# Patient Record
Sex: Female | Born: 1935
Health system: Southern US, Community
[De-identification: ages and names within clinical notes are randomized; demographics above are authoritative.]

## PROBLEM LIST (undated history)

## (undated) DIAGNOSIS — I10 Essential (primary) hypertension: Secondary | ICD-10-CM

## (undated) DIAGNOSIS — C801 Malignant (primary) neoplasm, unspecified: Secondary | ICD-10-CM

## (undated) DIAGNOSIS — E119 Type 2 diabetes mellitus without complications: Secondary | ICD-10-CM

## (undated) DIAGNOSIS — K5790 Diverticulosis of intestine, part unspecified, without perforation or abscess without bleeding: Secondary | ICD-10-CM

## (undated) DIAGNOSIS — E039 Hypothyroidism, unspecified: Secondary | ICD-10-CM

## (undated) DIAGNOSIS — M1990 Unspecified osteoarthritis, unspecified site: Secondary | ICD-10-CM

## (undated) HISTORY — PX: EYE SURGERY: SHX253

## (undated) HISTORY — PX: JOINT REPLACEMENT: SHX530

## (undated) HISTORY — PX: ANKLE SURGERY: SHX546

## (undated) HISTORY — PX: SHOULDER OPEN ROTATOR CUFF REPAIR: SHX2407

## (undated) HISTORY — PX: APPENDECTOMY: SHX54

## (undated) HISTORY — PX: CATARACT EXTRACTION: SUR2

## (undated) HISTORY — PX: SHOULDER ARTHROSCOPY W/ ROTATOR CUFF REPAIR: SHX2400

## (undated) HISTORY — PX: TUBAL LIGATION: SHX77

---

## 2000-06-11 ENCOUNTER — Encounter (INDEPENDENT_AMBULATORY_CARE_PROVIDER_SITE_OTHER): Payer: Self-pay | Admitting: *Deleted

## 2000-06-11 ENCOUNTER — Ambulatory Visit (HOSPITAL_BASED_OUTPATIENT_CLINIC_OR_DEPARTMENT_OTHER): Admission: RE | Admit: 2000-06-11 | Discharge: 2000-06-11 | Payer: Self-pay | Admitting: Plastic Surgery

## 2000-06-18 ENCOUNTER — Encounter (INDEPENDENT_AMBULATORY_CARE_PROVIDER_SITE_OTHER): Payer: Self-pay | Admitting: Specialist

## 2000-06-18 ENCOUNTER — Ambulatory Visit (HOSPITAL_COMMUNITY): Admission: RE | Admit: 2000-06-18 | Discharge: 2000-06-18 | Payer: Self-pay

## 2000-07-09 ENCOUNTER — Ambulatory Visit (HOSPITAL_BASED_OUTPATIENT_CLINIC_OR_DEPARTMENT_OTHER): Admission: RE | Admit: 2000-07-09 | Discharge: 2000-07-09 | Payer: Self-pay | Admitting: Plastic Surgery

## 2001-05-13 ENCOUNTER — Other Ambulatory Visit: Admission: RE | Admit: 2001-05-13 | Discharge: 2001-05-13 | Payer: Self-pay | Admitting: Family Medicine

## 2002-05-16 ENCOUNTER — Other Ambulatory Visit: Admission: RE | Admit: 2002-05-16 | Discharge: 2002-05-16 | Payer: Self-pay | Admitting: Family Medicine

## 2003-11-13 ENCOUNTER — Ambulatory Visit (HOSPITAL_COMMUNITY): Admission: RE | Admit: 2003-11-13 | Discharge: 2003-11-13 | Payer: Self-pay | Admitting: Plastic Surgery

## 2003-11-13 ENCOUNTER — Ambulatory Visit (HOSPITAL_BASED_OUTPATIENT_CLINIC_OR_DEPARTMENT_OTHER): Admission: RE | Admit: 2003-11-13 | Discharge: 2003-11-13 | Payer: Self-pay | Admitting: Plastic Surgery

## 2004-03-29 ENCOUNTER — Encounter: Admission: RE | Admit: 2004-03-29 | Discharge: 2004-03-29 | Payer: Self-pay | Admitting: Specialist

## 2004-05-02 ENCOUNTER — Ambulatory Visit (HOSPITAL_COMMUNITY): Admission: RE | Admit: 2004-05-02 | Discharge: 2004-05-02 | Payer: Self-pay | Admitting: *Deleted

## 2004-06-07 ENCOUNTER — Inpatient Hospital Stay (HOSPITAL_COMMUNITY): Admission: RE | Admit: 2004-06-07 | Discharge: 2004-06-08 | Payer: Self-pay | Admitting: Specialist

## 2004-12-27 ENCOUNTER — Encounter: Admission: RE | Admit: 2004-12-27 | Discharge: 2005-01-27 | Payer: Self-pay | Admitting: Family Medicine

## 2005-08-26 ENCOUNTER — Ambulatory Visit (HOSPITAL_COMMUNITY): Admission: RE | Admit: 2005-08-26 | Discharge: 2005-08-27 | Payer: Self-pay | Admitting: Ophthalmology

## 2007-04-14 ENCOUNTER — Inpatient Hospital Stay (HOSPITAL_COMMUNITY): Admission: RE | Admit: 2007-04-14 | Discharge: 2007-04-18 | Payer: Self-pay | Admitting: Orthopedic Surgery

## 2007-05-11 ENCOUNTER — Encounter: Admission: RE | Admit: 2007-05-11 | Discharge: 2007-06-10 | Payer: Self-pay | Admitting: Orthopedic Surgery

## 2010-05-26 ENCOUNTER — Encounter: Payer: Self-pay | Admitting: Family Medicine

## 2010-09-17 NOTE — H&P (Signed)
Nicole Sherman, Nicole Sherman               ACCOUNT NO.:  [id number]   MEDICAL RECORD NO.:  [id number]          PATIENT TYPE:  INP   LOCATION:  0005                         FACILITY:  [facility]   PHYSICIAN:  Ollen Gross, M.D.    DATE OF BIRTH:  [birth year]   DATE OF ADMISSION:  04/14/2007  DATE OF DISCHARGE:                              HISTORY & PHYSICAL   CHIEF COMPLAINT:  Right knee pain.   HISTORY OF PRESENT ILLNESS:  The patient is a 77 year old female who is  seen by Dr. Lequita Halt for ongoing knee problems that have progressing  gotten worse, especially over the past year. She has known end-stage  arthritis of the right knee with bone-on-bone. She has been refractory  to conservative management and now presents for total knee arthroplasty.  She has been seen preoperatively by Dr. Clarene Duke and felt to be medically  okay for upcoming surgery.  She has had a recent Cardiolite study read  by Dr. Catalina Gravel and felt to be low-risk nuclear study. No further  cardiac testing necessary prior to her surgery   ALLERGIES:  No known drug allergies.  Intolerances:  While taking  ASPIRIN and ALEVE together, did have bleeding problems.   CURRENT MEDICATIONS:  Levothyroxine, lisinopril, hydrochlorothiazide,  and Tylenol Arthritis.   PAST MEDICAL HISTORY:  1. Hypertension.  2. History of urinary tract infections.  3. History of rectal bleeding secondary to aspirin and Aleve therapy.  4. Non-insulin-dependent diabetes mellitus.  5. Hypothyroidism.   PAST SURGICAL HISTORY:  1. Appendectomy.  2. Ankle surgery.  3. Rotator cuff surgery.   SOCIAL HISTORY:  Married, retired, nonsmoker, no alcohol.  Two children.  Husband will be assisting with care after surgery.   FAMILY HISTORY:  Significant heart disease and diabetes with her sister.   REVIEW OF SYSTEMS:  GENERAL:  No fevers, chills, night sweats.  NEUROLOGIC:  No seizures, syncope, or paralysis.  RESPIRATORY:  No  shortness of breath, productive  cough or hemoptysis.  CARDIOVASCULAR:  No chest pain, angina, or orthopnea.  GI:  No nausea, vomiting, diarrhea  or constipation.  GU:  No dysuria, hematuria or discharge.  MUSCULOSKELETAL:  Right knee.   PHYSICAL EXAMINATION:  VITAL SIGNS:  Pulse 76, respirations 12.  Blood  pressure 126/72.  GENERAL:  A 77 year old white female, well nourished, well developed,  slightly overweight.  No acute distress.  She is alert and cooperative.  HEENT: Normocephalic, atraumatic.  Pupils are reactive.  Oropharynx  clear.  EOMs intact.  Full upper and lower denture plates.  NECK:  Supple.  CHEST:  Clear anterior posterior chest walls.  No rhonchi, rales,  wheezing.  HEART:  Early faint systolic ejection murmur best heard over aortic  point. S1-S2 noted  ABDOMEN:  Soft, nontender.  Bowel sounds present.  RECTAL, BREASTS, GENITALIA:  Not done, not pertinent to present illness.  EXTREMITIES:  Right knee significant crepitus noted.  No effusion.  Range of motion 10-115.  No instability.   IMPRESSION:  1. Osteoarthritis, right knee.  2. Hypertension.  3. Past history of urinary tract infections.  4. Past history of  rectal bleed secondary to ASPIRIN and ALEVE.  5. Non-insulin-dependent diabetes mellitus.  6. Hypothyroidism.   PLAN:  The patient will be admitted to [facility] to undergo a  right total knee arthroplasty.  Surgery will be performed by Dr. Ollen Gross.      Alexzandrew L. Perkins, P.A.C.      Ollen Gross, M.D.  Electronically Signed    ALP/MEDQ  D:  04/13/2007  T:  04/14/2007  Job:  [id number]   cc:   Caryn Bee L. Little, M.D.  Fax: [phone]

## 2010-09-17 NOTE — Op Note (Signed)
Nicole Sherman, Nicole Sherman               ACCOUNT NO.:  [id number]   MEDICAL RECORD NO.:  [id number]          PATIENT TYPE:  INP   LOCATION:  0005                         FACILITY:  [facility]   PHYSICIAN:  Ollen Gross, M.D.    DATE OF BIRTH:  [birth year]   DATE OF PROCEDURE:  04/14/2007  DATE OF DISCHARGE:                               OPERATIVE REPORT   PREOPERATIVE DIAGNOSIS:  Osteoarthritis, right knee.   POSTOPERATIVE DIAGNOSIS:  Osteoarthritis, right knee.   PROCEDURE:  Right total knee arthroplasty.   SURGEON:  Ollen Gross, M.D.   ASSISTANT:  Alexzandrew L. Perkins, P.A.C.   ANESTHESIA:  Spinal.   ESTIMATED BLOOD LOSS:  Minimal.   DRAINS:  None.   TOURNIQUET TIME:  31 minutes at 300 mmHg.   COMPLICATIONS:  None.   CONDITION:  Stable to the recovery room.   CLINICAL NOTE:  Nicole Sherman is a 77 year old female with end stage  arthritis of the right knee with progressively worsening pain and  dysfunction.  She has failed nonoperative management, has bone-on-bone  arthritis, and presents now for total knee arthroplasty.   PROCEDURE IN DETAIL:  After the successful administration of spinal  anesthetic, a tourniquet was placed high on the right thigh, and the  right lower extremity prepped and draped in the usual sterile fashion.  The extremity was wrapped in Esmarch, knee flexed, and tourniquet  inflated to 300 mmHg.  A midline incision was made with a 10 blade  through subcutaneous tissue to the level of the extensor mechanism.  A  fresh blade was used to make a medial parapatellar arthrotomy.  Soft  tissue on the proximal medial tibia is subperiosteally elevated to the  joint line with the knife into the semimembranosus bursa with a Cobb  elevator.  Soft tissue laterally is elevated with attention being paid  to avoid the patellar tendon on the tibial tubercle.  The patella  subluxed laterally, knee flexed 90 degrees, ACL and PCL were removed.  The drill was used to  create a starting hole in the distal femur and the  canal was thoroughly irrigated.  The 5 degrees right valgus alignment  guide is placed and referencing off the posterior condyles, rotation is  marked and the block pinned to remove 10 mm off the distal femur.  Distal femoral resection is made with an oscillating saw.  Sizing block  was placed, size 4 is the most appropriate.  Rotation is marked off the  epicondylar axis.  A size 4 cutting block was placed and the anterior,  posterior, and chamfer cuts were made.   The tibia subluxed forward and the menisci were removed.  The  extramedullary tibial alignment guide is placed referencing proximally  at the medial aspect of the tibial tubercle and distally along the  second metatarsal axis of the tibial crest.  The block was pinned to  remove about 4 mm off the more deficient lateral side.  Tibial resection  is made with an oscillating saw.  Size 2.5 is the most appropriate  tibial component and the proximal tibia is prepared with the modular  drill and keel punch for size 2.5.  Femoral preparation is completed  with the intercondylar cut.   A size 2.5 mobile bearing tibial trial, 2.5 posterior stabilized femoral  trial, and a 10 mm posterior stabilized rotating platform insert trial  are placed.  With the 10, she hyperextended a degree or two and we went  to 12.5 which allowed for full extension with excellent varus, valgus,  anterior, posterior balance throughout full range of motion.  The  patella was then everted, thickness measured to be 24 mm.  Free hand  resection was taken to 14 mm, 38 template is placed, lug holes were  drilled, trial patella was placed and it tracks normally.  Osteophytes  were removed off the posterior femur with the trial in place.  All  trials were removed and the cut bone surfaces are prepared with  pulsatile lavage.  The cement was mixed and once ready for implantation,  size 2.5 mobile bearing tibial tray,  2.5 posterior stabilized femur, and  38 patella are cemented into place.  The patella was held with the  clamp.  A trial 12.5 mm insert was placed and the knee held in full  extension and all extruded cement removed.  When the cement had fully  hardened, the trial inserts were removed, and the wound was copiously  irrigated with saline solution.  FloSeal was injected on the posterior  capsule and the permanent 10 mm posterior stabilized rotating platform  insert is placed in the tibial tray.   The suprapatellar area and the medial and lateral gutters were then  infiltrated with FloSeal.  A moist sponge is placed and the tourniquet  is released with a total time of 31 minutes.  The sponge is held for two  minutes then removed.  There was minimal bleeding encountered.  That  which was encountered was stopped with electrocautery.  We irrigated  again and closed the arthrotomy with interrupted #1 PDS.  Flexion  against gravity was to 135 degrees.  The subcu was closed with  interrupted 2-0 Vicryl and subcuticular running 4-0 Monocryl.  The  incisions were cleaned and dried and Steri-Strips and a bulky sterile  dressing applied.  She is then placed into a knee immobilizer, awakened,  and transferred to recovery in stable condition.      Ollen Gross, M.D.  Electronically Signed     FA/MEDQ  D:  04/14/2007  T:  04/14/2007  Job:  [id number]

## 2010-09-17 NOTE — Discharge Summary (Signed)
Nicole Sherman, Nicole Sherman               ACCOUNT NO.:  [id number]   MEDICAL RECORD NO.:  [id number]          PATIENT TYPE:  INP   LOCATION:  1616                         FACILITY:  [facility]   PHYSICIAN:  Ollen Gross, M.D.    DATE OF BIRTH:  [birth year]   DATE OF ADMISSION:  04/14/2007  DATE OF DISCHARGE:  04/18/2007                               DISCHARGE SUMMARY   ADMITTING DIAGNOSES:  1. Osteoarthritis right knee.  2. Hypertension.  3. Past history of urinary tract infections.  4. Past history of rectal bleed, secondary to aspirin and Aleve.  5. Non-insulin-dependent diabetes mellitus.  6. Hypothyroidism.   DISCHARGE DIAGNOSES:  1. Osteoarthritis right knee, status post right total knee      arthroplasty.  2. Post-op blood loss anemia, did not require transfusion.  3. Hypertension.  4. Past history of urinary tract infections.  5. Past history of rectal bleed, secondary to aspirin and Aleve.  6. Non-insulin-dependent diabetes mellitus.  7. Hypothyroidism.  8. Positive volume overload, post-operative.   PROCEDURE:  April 14, 2007 right total knee.   SURGEON:  Dr. Lequita Halt.   ASSISTANT:  Avel Peace PA-C.   ANESTHESIA:  Spinal anesthesia.   CONSULTS:  None.   BRIEF HISTORY:  Nicole Sherman is a 77 year old female with end-stage  arthritis of the right knee, with progressive worsening pain and  dysfunction, failing all conservative management, now has bone-on-bone  arthritis, presents for total knee arthroplasty.   LABORATORY DATA:  Pre-operative CBC showed hemoglobin 12.7, hematocrit  37.0, white cell count 5.6, post-op hemoglobin 10.8, drift down to 9.9;  back up to 10.6 and 30.6.  PT/PTT pre-op 12.6 and 28 respectively.  INR  0.9.  Serial pro-times followed that showed PT/INR 21.6 and 1.8.  Chem  panel on admission all within normal limits, with the exception of  minimally elevated glucose of 139.  Serial BMETs were followed.  Electrolytes remained within normal limits.   Glucose went up to 178,  back down to 142.  Pre-op UA negative.  Blood group type was O+.  Follow-  up UA did show some trace ketones, positive protein, few bacteria and 0  to 2 red cells.   EKG dated January 28, 2007:  Normal sinus rhythm, late transition  nonspecific anterior lateral T abnormalities, confirmed.   Chest X-Ray: 04/2007:  No acute cardiopulmonary findings, stable  appearance of chest since August 26, 2005.   HOSPITAL COURSE:  The patient admitted to [facility],  tolerated seizure well, later transferred to the recovery room,  orthopedic floor, started on PCA and p.o. analgesics, given 24 hours  post-op IV antibiotics, started back on her home meds.  She did take  lisinopril/ hydrochlorothiazide.  Those were set with parameters.  Was  noted on day one to have some volume overload, so we did some Lasix,  held the hydrochlorothiazide, while we did the IV Lasix for some gentle  diuresis, due to her volume overload.  Had a history of UTIs, but a  negative UA pre-op.  We did check the UA prior to removing the Foley,  start back  on the remaining home meds. Got up out of bed on day one.  By  day two was doing a little bit better, got up and walked about 20 feet.  Dressing change incision looked good. UA was essentially negative, with  exception of just few bacteria, 0 to 2 red cells, was diuresing fluids  much better.  Started progressing well with therapy and got up about 95  feet by day 2, continued to ambulate well.  Weaned over p.m. meds.  PCA  was discontinued on day 2, and by day 3 weaned over p.m. meds,  progressed with mobility and by day 4, tolerating her meds, was  discharged home.   DISCHARGE PLAN:  1. Patient discharged home on April 18, 2007.  2. Discharge diagnoses:  Please see above.  3. Discharge meds:  Percocet, Robaxin, Nu-Iron, Coumadin.  Follow-up 2      weeks.   ACTIVITY:  Weightbearing as tolerated in right lower extremity.  Home   health PT, home health nursing.   DIET:  Low sodium, diabetic diet.   DISPOSITION:  Home.   CONDITION ON DISCHARGE:  Improved.      Alexzandrew L. Perkins, P.A.C.      Ollen Gross, M.D.  Electronically Signed    ALP/MEDQ  D:  05/04/2007  T:  05/04/2007  Job:  [id number]   cc:   Ollen Gross, M.D.  Fax: [phone]   Anna Genre. Little, M.D.  Fax: [phone]

## 2010-09-20 NOTE — Consult Note (Signed)
Nicole Sherman, Nicole Sherman               ACCOUNT NO.:  [id number]   MEDICAL RECORD NO.:  [id number]          PATIENT TYPE:  INP   LOCATION:  0473                         FACILITY:  [facility]   PHYSICIAN:  Corinna L. Lendell Caprice, MDDATE OF BIRTH:  06/22/1933   DATE OF CONSULTATION:  06/07/2004  DATE OF DISCHARGE:                                   CONSULTATION   ATTENDING AND REQUESTING PHYSICIAN:  Dr. Otelia Sergeant.   REASON FOR CONSULTATION:  Hypoxia.   IMPRESSIONS/RECOMMENDATIONS:  1.  Hypoxia:  The patient is not dyspneic, she has no chest pain, and her      exam is unremarkable.  Also, the chest x-ray shows no congestive heart      failure or infiltrate.  At this point, the etiology is unclear.  The      ABG, however, does confirm the significant hypoxia and I will therefore      order a CT of the chest to rule out pulmonary embolus.  For now, I will      keep her on oxygen and here on the floor.  She looks relatively      comfortable and vital signs are otherwise stable.  2.  Hyponatremia:  Possibly secondary to the half normal saline versus      hydrochlorothiazide.  For now I will monitor this.  3.  Hypokalemia:  Will be repleted orally.  4.  Hypertension.  5.  Hypothyroidism.  6.  Status post right rotator cuff repair with distal clavicle resection and      acromioplasty.   HISTORY OF PRESENT ILLNESS:  Nicole Sherman is a pleasant 77 year old white  female patient who was admitted yesterday after having shoulder surgery.  Her primary care physician is Dr. Catha Gosselin.  She is not short of breath,  she has no history of smoking, she has not been wheezing, she has no cough.  Her oxygen levels, however, drop into the mid 80s when she walks.  At rest  on 2 L of oxygen her saturations range from 90-96%.  She has no history of  thromboembolism and her morphine PCA was just discontinued.   PAST MEDICAL HISTORY:  As above.   MEDICATIONS:  1.  Colace.  2.  Cardizem CD 240 mg p.o. daily.  3.   Zocor 10 mg a day.  4.  Hydrochlorothiazide 12.5 mg a day.  5.  Synthroid 125 mcg a day alternating with 150 mcg a day.  6.  Aspirin 325 mg p.o. b.i.d.  7.  Ancef.  8.  Robaxin as needed.   No known drug allergies.   SOCIAL HISTORY:  The patient quit smoking 18 years ago.  She does not drink.  She is married.   FAMILY HISTORY:  Her mother died in her 52s of an aneurysm and heart attack.  Her father died in his 27s of a heart attack.   REVIEW OF SYSTEMS:  As above; otherwise, negative.   PHYSICAL EXAMINATION:  VITAL SIGNS:  Temperature is 97.9, pulse 66,  respiratory rate 20, blood pressure 146/65, oxygen saturation 93% on room  air.  GENERAL:  The patient is well-nourished, well-developed, in no acute  distress, speaking in full sentences without any respiratory distress.  HEENT:  Normocephalic, atraumatic.  Pupils equal, round, and reactive to  light.  Sclerae nonicteric.  Moist mucous membranes.  NECK:  Supple, no JVD.  No thyromegaly.  LUNGS:  Clear to auscultation bilaterally without wheezes, rhonchi, or  rales.  CARDIOVASCULAR:  Regular rate and rhythm without murmurs, gallops, rubs.  ABDOMEN:  Normal bowel sounds, soft, nontender, nondistended.  GENITOURINARY AND RECTAL:  Deferred.  EXTREMITIES:  No clubbing, cyanosis, or edema.  No calf tenderness.  Denna Haggard'  sign negative.  Her right arm is in a sling.   LABORATORY DATA:  Sodium is 128, potassium 3.1, chloride 91, bicarbonate 30,  glucose 112, BUN 6, creatinine 0.4.  BNP is 148.  ABG on room air reveals a  pH of 7.378, PCO2 of 55.7, PO2 of 45.  EKG from January 17 shows normal  sinus rhythm.  Chest x-ray done this afternoon shows bibasilar atelectasis,  right greater than left, with mild diffuse peribronchial thickening; no  consolidation, effusion, or pneumothorax.   I would like to thank Dr. Otelia Sergeant for this consultation.  Further  recommendations to follow.      CLS/MEDQ  D:  06/07/2004  T:  06/07/2004  Job:   [id number]   cc:   Caryn Bee L. Little, M.D.  [address]  [place]  [place] [address]  Fax: [phone]   Kerrin Champagne, M.D.  [address]  [place]  [place] [address]  Fax: [phone]

## 2010-10-20 ENCOUNTER — Emergency Department (HOSPITAL_COMMUNITY)
Admission: EM | Admit: 2010-10-20 | Discharge: 2010-10-20 | Disposition: A | Discharge status: Home / Self Care | Payer: No Typology Code available for payment source | Attending: Emergency Medicine | Admitting: Emergency Medicine

## 2010-10-20 ENCOUNTER — Emergency Department (HOSPITAL_COMMUNITY): Payer: No Typology Code available for payment source

## 2010-10-20 DIAGNOSIS — E119 Type 2 diabetes mellitus without complications: Secondary | ICD-10-CM | POA: Insufficient documentation

## 2010-10-20 DIAGNOSIS — Y92410 Unspecified street and highway as the place of occurrence of the external cause: Secondary | ICD-10-CM | POA: Insufficient documentation

## 2010-10-20 DIAGNOSIS — R079 Chest pain, unspecified: Secondary | ICD-10-CM | POA: Insufficient documentation

## 2010-10-20 DIAGNOSIS — I10 Essential (primary) hypertension: Secondary | ICD-10-CM | POA: Insufficient documentation

## 2010-10-20 DIAGNOSIS — S0990XA Unspecified injury of head, initial encounter: Secondary | ICD-10-CM | POA: Insufficient documentation

## 2010-10-20 DIAGNOSIS — M47812 Spondylosis without myelopathy or radiculopathy, cervical region: Secondary | ICD-10-CM | POA: Insufficient documentation

## 2010-10-30 ENCOUNTER — Ambulatory Visit: Payer: No Typology Code available for payment source | Attending: Family Medicine | Admitting: Physical Therapy

## 2010-10-30 DIAGNOSIS — R5381 Other malaise: Secondary | ICD-10-CM | POA: Insufficient documentation

## 2010-10-30 DIAGNOSIS — M2550 Pain in unspecified joint: Secondary | ICD-10-CM | POA: Insufficient documentation

## 2010-10-30 DIAGNOSIS — IMO0001 Reserved for inherently not codable concepts without codable children: Secondary | ICD-10-CM | POA: Insufficient documentation

## 2010-11-04 ENCOUNTER — Ambulatory Visit: Payer: Medicare Other | Attending: Family Medicine | Admitting: Physical Therapy

## 2010-11-04 DIAGNOSIS — R5381 Other malaise: Secondary | ICD-10-CM | POA: Insufficient documentation

## 2010-11-04 DIAGNOSIS — M2550 Pain in unspecified joint: Secondary | ICD-10-CM | POA: Insufficient documentation

## 2010-11-04 DIAGNOSIS — IMO0001 Reserved for inherently not codable concepts without codable children: Secondary | ICD-10-CM | POA: Insufficient documentation

## 2010-11-07 ENCOUNTER — Ambulatory Visit: Payer: Medicare Other | Admitting: Physical Therapy

## 2010-11-11 ENCOUNTER — Ambulatory Visit: Payer: Medicare Other | Admitting: Physical Therapy

## 2010-11-13 ENCOUNTER — Ambulatory Visit: Payer: Medicare Other | Admitting: Physical Therapy

## 2010-11-19 ENCOUNTER — Encounter: Payer: Medicare Other | Admitting: Physical Therapy

## 2010-11-22 ENCOUNTER — Encounter: Payer: Medicare Other | Admitting: Physical Therapy

## 2010-11-25 ENCOUNTER — Encounter: Payer: Medicare Other | Admitting: Physical Therapy

## 2010-11-27 ENCOUNTER — Encounter: Payer: Medicare Other | Admitting: Physical Therapy

## 2011-02-10 LAB — PROTIME-INR
INR: 0.9
INR: 1.1
INR: 1.3
INR: 1.8 — ABNORMAL HIGH
Prothrombin Time: 12.6
Prothrombin Time: 14.1
Prothrombin Time: 16 — ABNORMAL HIGH
Prothrombin Time: 21.6 — ABNORMAL HIGH

## 2011-02-10 LAB — URINALYSIS, ROUTINE W REFLEX MICROSCOPIC
Bilirubin Urine: NEGATIVE
Bilirubin Urine: NEGATIVE
Glucose, UA: NEGATIVE
Glucose, UA: NEGATIVE
Hgb urine dipstick: NEGATIVE
Ketones, ur: NEGATIVE
Nitrite: NEGATIVE
Nitrite: NEGATIVE
Protein, ur: NEGATIVE
Specific Gravity, Urine: 1.021
Specific Gravity, Urine: 1.03
Urobilinogen, UA: 0.2
pH: 6
pH: 7

## 2011-02-10 LAB — CBC
HCT: 29 — ABNORMAL LOW
HCT: 30.6 — ABNORMAL LOW
HCT: 31.6 — ABNORMAL LOW
HCT: 37
Hemoglobin: 10.6 — ABNORMAL LOW
Hemoglobin: 10.8 — ABNORMAL LOW
Hemoglobin: 12.7
Hemoglobin: 9.9 — ABNORMAL LOW
MCHC: 34.2
MCHC: 34.3
MCHC: 34.3
MCV: 87.5
MCV: 88.2
MCV: 88.5
Platelets: 200
Platelets: 227
Platelets: 247
Platelets: 275
RBC: 3.29 — ABNORMAL LOW
RBC: 3.5 — ABNORMAL LOW
RBC: 3.57 — ABNORMAL LOW
RBC: 4.23
RDW: 14.8
RDW: 14.8
RDW: 14.9
WBC: 5.6
WBC: 7.8
WBC: 9.1
WBC: 9.4

## 2011-02-10 LAB — TYPE AND SCREEN
ABO/RH(D): O POS
Antibody Screen: NEGATIVE

## 2011-02-10 LAB — COMPREHENSIVE METABOLIC PANEL
ALT: 15
AST: 17
Albumin: 3.9
Alkaline Phosphatase: 57
BUN: 22
CO2: 28
Calcium: 9.7
Chloride: 104
Creatinine, Ser: 1.04
GFR calc Af Amer: >60
GFR calc non Af Amer: 52 — ABNORMAL LOW
Glucose, Bld: 139 — ABNORMAL HIGH
Potassium: 3.8
Sodium: 141
Total Bilirubin: 0.8
Total Protein: 6.5

## 2011-02-10 LAB — BASIC METABOLIC PANEL
BUN: 16
BUN: 8
CO2: 29
CO2: 32
Calcium: 8.5
Calcium: 8.7
Chloride: 101
Chloride: 102
Creatinine, Ser: 0.52
Creatinine, Ser: 0.57
GFR calc Af Amer: >60
GFR calc Af Amer: >60
GFR calc non Af Amer: >60
GFR calc non Af Amer: >60
Glucose, Bld: 142 — ABNORMAL HIGH
Glucose, Bld: 178 — ABNORMAL HIGH
Potassium: 4.1
Potassium: 4.5
Sodium: 136
Sodium: 138

## 2011-02-10 LAB — APTT: aPTT: 28

## 2011-02-10 LAB — URINE MICROSCOPIC-ADD ON

## 2011-02-10 LAB — ABO/RH: ABO/RH(D): O POS

## 2015-04-20 ENCOUNTER — Other Ambulatory Visit: Payer: Self-pay | Admitting: Family Medicine

## 2015-04-20 DIAGNOSIS — R1013 Epigastric pain: Secondary | ICD-10-CM

## 2015-05-04 ENCOUNTER — Ambulatory Visit
Admission: RE | Admit: 2015-05-04 | Discharge: 2015-05-04 | Disposition: A | Discharge status: Home / Self Care | Payer: Medicare Other | Source: Ambulatory Visit | Attending: Family Medicine | Admitting: Family Medicine

## 2015-05-04 DIAGNOSIS — R1013 Epigastric pain: Secondary | ICD-10-CM

## 2015-05-10 ENCOUNTER — Other Ambulatory Visit: Payer: Self-pay | Admitting: Family Medicine

## 2015-05-10 DIAGNOSIS — R1084 Generalized abdominal pain: Secondary | ICD-10-CM

## 2015-05-16 ENCOUNTER — Ambulatory Visit
Admission: RE | Admit: 2015-05-16 | Discharge: 2015-05-16 | Disposition: A | Discharge status: Home / Self Care | Payer: Medicare Other | Source: Ambulatory Visit | Attending: Family Medicine | Admitting: Family Medicine

## 2015-05-16 DIAGNOSIS — R1084 Generalized abdominal pain: Secondary | ICD-10-CM

## 2015-05-16 MED ORDER — IOPAMIDOL (ISOVUE-300) INJECTION 61%
100.0000 mL | Freq: Once | INTRAVENOUS | Status: AC | PRN
Start: 2015-05-16 — End: 2015-05-16
  Administered 2015-05-16: 100 mL via INTRAVENOUS

## 2015-05-17 ENCOUNTER — Encounter: Payer: Self-pay | Admitting: *Deleted

## 2015-05-17 ENCOUNTER — Telehealth: Payer: Self-pay | Admitting: *Deleted

## 2015-05-17 NOTE — Telephone Encounter (Signed)
Spoke with Dr. Rex Kras regarding referral for pancreas cancer. She was notified of diagnosis today in his office. She is not jaundiced and is stable. Should be fine to be seen on 05/23/15 if possible by Dr. Burr Medico. Has not rechecked her bili since 04/18/16 and it was normal then. Chart on Dr. Ernestina Penna desk for review.

## 2015-05-18 ENCOUNTER — Telehealth: Payer: Self-pay | Admitting: *Deleted

## 2015-05-18 NOTE — Telephone Encounter (Signed)
Oncology Nurse Navigator Documentation  Oncology Nurse Navigator Flowsheets 05/18/2015  Navigator Location [facility]  Navigator Encounter Type Introductory phone call  Abnormal Finding Date 05/04/2015  Barriers/Navigation Needs Coordination of Care--fax to [facility] for EGD/EUS and biopsy and called PCP to facilitate this  Interventions Referrals  Time Spent with Patient 5  Spoke with patient and provided new patient appointment for 05/23/15 at 2:15/3:30 with Dr. Burr Medico. Informed of location of [place], valet service, and registration process. Reminded to bring insurance cards and a current medication list, including supplements. Patient verbalizes understanding. She will be out of town seeing her grandchild until Tuesday. Requests call on [phone] if she needs to know of any procedures before she comes back.

## 2015-05-21 NOTE — Anesthesia Preprocedure Evaluation (Addendum)
Anesthesia Evaluation  Patient identified by MRN, date of birth, ID band Patient awake    Reviewed: Allergy & Precautions, NPO status , Patient's Chart, lab work & pertinent test results  Airway Mallampati: II   Neck ROM: Full    Dental  (+) Edentulous Upper, Edentulous Lower   Pulmonary neg pulmonary ROS, former smoker,    breath sounds clear to auscultation       Cardiovascular hypertension, Pt. on medications negative cardio ROS   Rhythm:Regular     Neuro/Psych negative neurological ROS  negative psych ROS   GI/Hepatic Neg liver ROS, Pancreatic Mass   Endo/Other  diabetes, Type 2  Renal/GU negative Renal ROS  negative genitourinary   Musculoskeletal negative musculoskeletal ROS (+)   Abdominal (+)  Abdomen: soft.    Peds negative pediatric ROS (+)  Hematology negative hematology ROS (+)   Anesthesia Other Findings   Reproductive/Obstetrics negative OB ROS                            Anesthesia Physical Anesthesia Plan  ASA: II  Anesthesia Plan: General   Post-op Pain Management:    Induction: Intravenous  Airway Management Planned: Oral ETT  Additional Equipment:   Intra-op Plan:   Post-operative Plan:   Informed Consent: I have reviewed the patients History and Physical, chart, labs and discussed the procedure including the risks, benefits and alternatives for the proposed anesthesia with the patient or authorized representative who has indicated his/her understanding and acceptance.     Plan Discussed with:   Anesthesia Plan Comments:         Anesthesia Quick Evaluation

## 2015-05-22 ENCOUNTER — Encounter (HOSPITAL_COMMUNITY): Payer: Self-pay | Admitting: *Deleted

## 2015-05-23 ENCOUNTER — Encounter: Payer: Self-pay | Admitting: *Deleted

## 2015-05-23 ENCOUNTER — Telehealth: Payer: Self-pay | Admitting: Hematology

## 2015-05-23 ENCOUNTER — Ambulatory Visit (HOSPITAL_BASED_OUTPATIENT_CLINIC_OR_DEPARTMENT_OTHER): Payer: Medicare Other | Admitting: Hematology

## 2015-05-23 ENCOUNTER — Encounter: Payer: Self-pay | Admitting: Hematology

## 2015-05-23 VITALS — BP 121/73 | HR 78 | Temp 98.1°F | Resp 18 | Ht 66.5 in | Wt 151.1 lb

## 2015-05-23 DIAGNOSIS — E039 Hypothyroidism, unspecified: Secondary | ICD-10-CM | POA: Diagnosis not present

## 2015-05-23 DIAGNOSIS — C259 Malignant neoplasm of pancreas, unspecified: Secondary | ICD-10-CM

## 2015-05-23 DIAGNOSIS — C251 Malignant neoplasm of body of pancreas: Secondary | ICD-10-CM

## 2015-05-23 NOTE — Progress Notes (Signed)
Oncology Nurse Navigator Documentation  Oncology Nurse Navigator Flowsheets 05/23/2015  Navigator Location [facility]  Navigator Encounter Type Initial MedOnc  Abnormal Finding Date -  Patient Visit Type MedOnc;Initial  Treatment Phase Pre-Tx/Tx Discussion  Barriers/Navigation Needs Family concerns;Education  Education Understanding Cancer/ Treatment Options;Coping with Diagnosis/ Prognosis;Pain/ Symptom Management;Newly Diagnosed Cancer Education;Preparing for Upcoming Biopsy Treatment  Interventions Referrals;Coordination of Care;Education Method  Referrals Social Work;Nutrition/dietician  Coordination of Care Other;Radiology  Education Method Verbal;Written;Teach-back  Support Groups/Services GI Support Group;Other-ACS for [place]  Acuity Level 2  Time Spent with Patient 22  Met with patient, husband, daughters [name] and [place]), and son-in-law during new patient visit. Explained the role of the GI Nurse Navigator and provided New Patient Packet with information on: 1. Pancreatic cancer 2. Support groups 3. Advanced Directives 4. Fall Safety Plan Answered questions, reviewed current treatment plan using TEACH back and provided emotional support. Provided copy of current treatment plan. Encouraged her to focus on the positives: her liver looks good on CT, liver functions are good and she is otherwise healthy. Will start OTC Zantac bid or Prilosec daily and try to drink 1-2 Boost/Ensure a day in addition to 6 small meals/day. Will add her case to GI Tumor Board on 05/30/15 per MD and follow up on precert for CT chest. Faxed request to [facility] for the lab results and office note from visit with Dr. Paulita Fujita yesterday. Will ask for CSW to see, especially for her husband who she reports is not doing well with the situation emotionally. Nicole Sherman seems to be coping well at present. 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 She enjoys reading and sewing in spare time. Independent in ADLs and still drives.   Merceda Elks, RN, BSN GI Oncology [place]

## 2015-05-23 NOTE — Progress Notes (Signed)
[place]  [phone] [phone]  Clinic New Consult Note   Patient Care Team: Hulan Fess, MD as PCP - General (Family Medicine) 05/23/2015  REFERRAL PHYSICIAN: Gennette Pac, MD  CHIEF COMPLAINTS/PURPOSE OF CONSULTATION:  Pancreatic mass   HISTORY OF PRESENTING ILLNESS:  Nicole Sherman 82 y.o. female is here because of her recent abnormal CT scan, which showed a pink vaginal mass, highly suspicious for pancreatic cancer.  She has been haivng epigastric pain after meal for 4-5 months. She also reports left side pain at night when she sleeps on left side, mild back pain, the pain is worse lately, lasts longer especially afte rdinner, it about 5/10, appetite is lower than before, she lost aobut 10-20lbs in the past 5 months. No other complains, she had loose BM 1-2 time BM for 8-10 months, no hematochezia or melena. She was evaluated by her primary care physician Dr. Rex Kras, abdominal ultrasound was negative on 05/04/2015. She underwent a CT abdomen and pelvis with contrast on 05/16/2015, which showed a 4 x 1 cm mass in pancreatic body and a neck, tumor encases the distal celiac and proximal branches and occludes the main portal vein. No distant metastasis on the CT scan. She was referred to [place] for further workup.  She has good energy level, she is able to do all house work. She has intermittent dizziness from blood pressure meds. No other complains.   MEDICAL HISTORY:  Past Medical History  Diagnosis Date  . Hypertension   . Hypothyroidism   . Diabetes mellitus without complication ([place])   . Arthritis     osteoarthritis-hands wrist  . Diverticulosis     showing on CT of abdomen 05-16-15  . Cancer [facility])     pancreatic mass- dx. adenocarcinoma" CT abdomen 05-16-15    SURGICAL HISTORY: Past Surgical History  Procedure Laterality Date  . Joint replacement Right   . Shoulder arthroscopy w/ rotator cuff repair Left   . Shoulder open  rotator cuff repair Right   . Tubal ligation    . Appendectomy      open  . Ankle surgery Right   . Eye surgery      macular hole surgery repair  . Cataract extraction Right     SOCIAL HISTORY: Social History   Social History  . Marital Status: Married    Spouse Name: N/A  . Number of Children: 2 daughters   . Years of Education: N/A   Occupational History  . Not on file.   Social History Main Topics  . Smoking status: Former Smoker -- 1.00 packs/day for 30 years    Types: Cigarettes    Quit date: 05/21/1986  . Smokeless tobacco: Not on file  . Alcohol Use: No  . Drug Use: No  . Sexual Activity: Not on file   Other Topics Concern  . Not on file   Social History Narrative    FAMILY HISTORY: Family History  Problem Relation Age of Onset  . Stroke Mother   . Cancer Sister     lung cancer     ALLERGIES:  is allergic to aspirin.  MEDICATIONS:  Current Outpatient Prescriptions  Medication Sig Dispense Refill  . acetaminophen (TYLENOL) 500 MG tablet Take 1,000 mg by mouth every 6 (six) hours as needed (Pain).    Marland Kitchen levothyroxine (SYNTHROID, LEVOTHROID) 100 MCG tablet Take 100 mcg by mouth daily before breakfast.     . lisinopril (PRINIVIL,ZESTRIL) 20 MG tablet Take 20 mg by mouth  daily.     . metFORMIN (GLUCOPHAGE) 1000 MG tablet Take 1,000 mg by mouth 2 (two) times daily with a meal.      No current facility-administered medications for this visit.    REVIEW OF SYSTEMS:   Constitutional: Denies fevers, chills or abnormal night sweats Eyes: Denies blurriness of vision, double vision or watery eyes Ears, nose, mouth, throat, and face: Denies mucositis or sore throat Respiratory: Denies cough, dyspnea or wheezes Cardiovascular: Denies palpitation, chest discomfort or lower extremity swelling Gastrointestinal:  Denies nausea, heartburn or change in bowel habits Skin: Denies abnormal skin rashes Lymphatics: Denies new lymphadenopathy or easy  bruising Neurological:Denies numbness, tingling or new weaknesses Behavioral/Psych: Mood is stable, no new changes  All other systems were reviewed with the patient and are negative.  PHYSICAL EXAMINATION: ECOG PERFORMANCE STATUS: 1 - Symptomatic but completely ambulatory  Filed Vitals:   05/23/15 1453  BP: 121/73  Pulse: 78  Temp: 98.1 F (36.7 C)  Resp: 18   Filed Weights   05/23/15 1453  Weight: 151 lb 1.6 oz (68.539 kg)    GENERAL:alert, no distress and comfortable SKIN: skin color, texture, turgor are normal, no rashes or significant lesions EYES: normal, conjunctiva are pink and non-injected, sclera clear OROPHARYNX:no exudate, no erythema and lips, buccal mucosa, and tongue normal  NECK: supple, thyroid normal size, non-tender, without nodularity LYMPH:  no palpable lymphadenopathy in the cervical, axillary or inguinal LUNGS: clear to auscultation and percussion with normal breathing effort HEART: regular rate & rhythm and no murmurs and no lower extremity edema ABDOMEN:abdomen soft, non-tender and normal bowel sounds Musculoskeletal:no cyanosis of digits and no clubbing  PSYCH: alert & oriented x 3 with fluent speech NEURO: no focal motor/sensory deficits  LABORATORY DATA:  I have reviewed the data as listed Lab Results  Component Value Date   WBC 9.1 04/17/2007   HGB 10.6* 04/17/2007   HCT 30.6* 04/17/2007   MCV 87.5 04/17/2007   PLT 247 04/17/2007   No results for input(s): NA, K, CL, CO2, GLUCOSE, BUN, CREATININE, CALCIUM, GFRNONAA, GFRAA, PROT, ALBUMIN, AST, ALT, ALKPHOS, BILITOT, BILIDIR, IBILI in the last 8760 hours.  RADIOGRAPHIC STUDIES: I have personally reviewed the radiological images as listed and agreed with the findings in the report. Ct Abdomen Pelvis W Contrast  05/16/2015  CLINICAL DATA:  Generalized abdominal pain for 4 months. EXAM: CT ABDOMEN AND PELVIS WITH CONTRAST TECHNIQUE: Multidetector CT imaging of the abdomen and pelvis was  performed using the standard protocol following bolus administration of intravenous contrast. Creatinine was obtained on site at [place] at [address] Results: Creatinine 0.6 mg/dL. CONTRAST:  125mL ISOVUE-300 IOPAMIDOL (ISOVUE-300) INJECTION 61% COMPARISON:  None. FINDINGS: Lower chest and abdominal wall:  No contributory findings. Hepatobiliary: No evidence of metastasis. Ill-defined hypervascularity in segment 5/6 on series 2, image 28 is likely a perfusion anomaly. Benign cyst in a lower right liver.No evidence of biliary obstruction or stone. Dilated biliary tree and gallbladder above the pancreatic head mass. Pancreas: There is a bulky hypo enhancing mass in the midline pancreatic body and neck measuring up to 41 mm. Soft tissue encases the distal celiac axis and its proximal branches. Occluded portal vein at the pancreatic head with reconstitution from channels. Small nonocclusive thrombus present beyond the occlusion. Marked proximal duct dilatation with atrophy. Enlarged peripancreatic lymph nodes up to 11 mm short axis superior to the main portal vein. No evidence of peritoneal tumor. Spleen: Unremarkable. Adrenals/Urinary Tract: Negative adrenals. No hydronephrosis or  stone. Unremarkable bladder. Reproductive:No pathologic findings. Stomach/Bowel:  Extensive colonic diverticulosis. Vascular/Lymphatic: Tumor related findings described above. There are varices in the proximal stomach. No mass or adenopathy. Peritoneal: No ascites or pneumoperitoneum. Musculoskeletal: No acute abnormalities. These results will be called to the ordering clinician or representative by the Radiologist Assistant, and communication documented in the PACS or zVision Dashboard. IMPRESSION: 1. Bulky pancreatic mass consistent with adenocarcinoma. Tumor encases the distal celiac and its proximal branches and occludes the main portal vein. Peripancreatic lymphadenopathy. 2. Biliary obstruction. 3. Proximal gastric  varices. Electronically Signed   By: Monte Fantasia M.D.   On: 05/16/2015 13:22   US Abdomen Limited  05/04/2015  CLINICAL DATA:  Postprandial epigastric region pain EXAM: US ABDOMEN LIMITED - RIGHT UPPER QUADRANT COMPARISON:  None. FINDINGS: Gallbladder: No gallstones or wall thickening visualized. No pericholecystic fluid. A small fold in the gallbladder is an anatomic variant. No sonographic Murphy sign noted by sonographer. Common bile duct: Diameter: 5 mm. No intrahepatic or extrahepatic biliary duct dilatation. Liver: There is a cyst in the right lobe of the liver inferiorly measuring 2.2 x 1.7 x 2.3 cm. No other focal liver lesion identified. Within normal limits in parenchymal echogenicity. IMPRESSION: Right lobe liver cyst. No other liver lesion appreciable. No demonstrable gallbladder pathology. No biliary duct dilatation appreciable. Electronically Signed   By: Lowella Grip III M.D.   On: 05/04/2015 08:39    ASSESSMENT & PLAN:  82 year old Caucasian female, presented with intermittent abdominal pain, weight loss, and a CT finding of a pancreatic mass.  1. Pancreatic mass, highly suspicious for pancreatic cancer. -I reviewed her CT scan findings with her and her family members in details. The scan image were reviewed with them in person. -This CT scan findings are highly suspicious for pancreatic cancer, especially adenocarcinoma, giving the infiltrative mass and vascular involvement. Also neuroendocrine tumor still possible, but less likely. -We have referred patient to gastroenterologist Dr. Paulita Fujita, who will perform EGD and EUS with pancreatic mass biopsy early next Monday. -I'll obtain a CT chest to complete his staging. -I'll present her case in our GI tumor Board next week, to see if surgery is still an option. Giving the vascular involvement, this is likely unresectable. -Given her advanced age, she may not be a good candidate for intensive chemotherapy. However she does have  very good performance status, and preserved organ function, single agent gemcitabine would be reasonable if she wishes to be treated.  -I will see her back next week to review her biopsy result and finalize her treatment plan.   2. HTN, DM, hypothyroidism  -follow up with PCP   Plan - CT chest without contrast next week - EGD and EUS/biopsy by Dr. Paulita Fujita early next week  -tumor board discussion next Wednesday -RTC in one week   All questions were answered. The patient knows to call the clinic with any problems, questions or concerns. I spent 55 minutes counseling the patient face to face. The total time spent in the appointment was 60 minutes and more than 50% was on counseling.     [name], MD 05/23/2015 3:33 PM

## 2015-05-23 NOTE — Telephone Encounter (Signed)
per pof tos ch pt appt-per Vaughan Basta she will call to get auth-gave pt copy of avs

## 2015-05-25 ENCOUNTER — Encounter: Payer: Self-pay | Admitting: Hematology

## 2015-05-28 ENCOUNTER — Other Ambulatory Visit: Payer: Self-pay | Admitting: Gastroenterology

## 2015-05-28 ENCOUNTER — Ambulatory Visit (HOSPITAL_COMMUNITY): Payer: Medicare Other | Admitting: Anesthesiology

## 2015-05-28 ENCOUNTER — Encounter (HOSPITAL_COMMUNITY)
Admission: RE | Disposition: A | Discharge status: Home / Self Care | Payer: Self-pay | Source: Ambulatory Visit | Attending: Gastroenterology

## 2015-05-28 ENCOUNTER — Encounter (HOSPITAL_COMMUNITY): Payer: Self-pay | Admitting: Anesthesiology

## 2015-05-28 ENCOUNTER — Ambulatory Visit (HOSPITAL_COMMUNITY)
Admission: RE | Admit: 2015-05-28 | Discharge: 2015-05-28 | Disposition: A | Discharge status: Home / Self Care | Payer: Medicare Other | Source: Ambulatory Visit | Attending: Gastroenterology | Admitting: Gastroenterology

## 2015-05-28 DIAGNOSIS — R634 Abnormal weight loss: Secondary | ICD-10-CM | POA: Insufficient documentation

## 2015-05-28 DIAGNOSIS — Z79899 Other long term (current) drug therapy: Secondary | ICD-10-CM | POA: Insufficient documentation

## 2015-05-28 DIAGNOSIS — C251 Malignant neoplasm of body of pancreas: Secondary | ICD-10-CM | POA: Insufficient documentation

## 2015-05-28 DIAGNOSIS — Z6824 Body mass index (BMI) 24.0-24.9, adult: Secondary | ICD-10-CM | POA: Insufficient documentation

## 2015-05-28 DIAGNOSIS — E119 Type 2 diabetes mellitus without complications: Secondary | ICD-10-CM | POA: Insufficient documentation

## 2015-05-28 DIAGNOSIS — Z96651 Presence of right artificial knee joint: Secondary | ICD-10-CM | POA: Diagnosis not present

## 2015-05-28 DIAGNOSIS — E039 Hypothyroidism, unspecified: Secondary | ICD-10-CM | POA: Insufficient documentation

## 2015-05-28 DIAGNOSIS — Z7984 Long term (current) use of oral hypoglycemic drugs: Secondary | ICD-10-CM | POA: Insufficient documentation

## 2015-05-28 DIAGNOSIS — Z87891 Personal history of nicotine dependence: Secondary | ICD-10-CM | POA: Diagnosis not present

## 2015-05-28 DIAGNOSIS — K8689 Other specified diseases of pancreas: Secondary | ICD-10-CM | POA: Diagnosis present

## 2015-05-28 DIAGNOSIS — I10 Essential (primary) hypertension: Secondary | ICD-10-CM | POA: Insufficient documentation

## 2015-05-28 HISTORY — DX: Unspecified osteoarthritis, unspecified site: M19.90

## 2015-05-28 HISTORY — DX: Diverticulosis of intestine, part unspecified, without perforation or abscess without bleeding: K57.90

## 2015-05-28 HISTORY — DX: Type 2 diabetes mellitus without complications: E11.9

## 2015-05-28 HISTORY — DX: Hypothyroidism, unspecified: E03.9

## 2015-05-28 HISTORY — DX: Malignant (primary) neoplasm, unspecified: C80.1

## 2015-05-28 HISTORY — DX: Essential (primary) hypertension: I10

## 2015-05-28 HISTORY — PX: EUS: SHX5427

## 2015-05-28 LAB — GLUCOSE, CAPILLARY: GLUCOSE-CAPILLARY: 153 mg/dL — AB (ref 65–99)

## 2015-05-28 SURGERY — UPPER ENDOSCOPIC ULTRASOUND (EUS) LINEAR
Anesthesia: General

## 2015-05-28 MED ORDER — PROPOFOL 500 MG/50ML IV EMUL
INTRAVENOUS | Status: DC | PRN
  Administered 2015-05-28: 75 ug/kg/min via INTRAVENOUS

## 2015-05-28 MED ORDER — CIPROFLOXACIN IN D5W 400 MG/200ML IV SOLN
INTRAVENOUS | Status: AC
  Filled 2015-05-28: qty 200

## 2015-05-28 MED ORDER — SODIUM CHLORIDE 0.9 % IV SOLN: INTRAVENOUS | Status: DC

## 2015-05-28 MED ORDER — PROPOFOL 10 MG/ML IV BOLUS
INTRAVENOUS | Status: AC
  Filled 2015-05-28: qty 20

## 2015-05-28 MED ORDER — LACTATED RINGERS IV SOLN
INTRAVENOUS | Status: DC
  Administered 2015-05-28: 1000 mL via INTRAVENOUS

## 2015-05-28 MED ORDER — LIDOCAINE HCL (CARDIAC) 20 MG/ML IV SOLN
INTRAVENOUS | Status: DC | PRN
  Administered 2015-05-28: 50 mg via INTRAVENOUS

## 2015-05-28 MED ORDER — FENTANYL CITRATE (PF) 100 MCG/2ML IJ SOLN
INTRAMUSCULAR | Status: DC | PRN
  Administered 2015-05-28 (×4): 25 ug via INTRAVENOUS

## 2015-05-28 MED ORDER — CIPROFLOXACIN IN D5W 400 MG/200ML IV SOLN
400.0000 mg | Freq: Once | INTRAVENOUS | Status: AC
  Administered 2015-05-28: 400 mg via INTRAVENOUS

## 2015-05-28 MED ORDER — PROPOFOL 10 MG/ML IV BOLUS
INTRAVENOUS | Status: DC | PRN
  Administered 2015-05-28: 30 mg via INTRAVENOUS
  Administered 2015-05-28 (×4): 20 mg via INTRAVENOUS

## 2015-05-28 MED ORDER — FENTANYL CITRATE (PF) 100 MCG/2ML IJ SOLN
INTRAMUSCULAR | Status: AC
  Filled 2015-05-28: qty 2

## 2015-05-28 NOTE — H&P (Signed)
Patient interval history reviewed.  Patient examined again.  There has been no change from documented H/P dated 05/22/15 (scanned into chart from our office) except as documented above.  Assessment:  1.  Pancreatic mass  Plan:  1.  Endoscopic ultrasound with anticipated fine needle aspiration (FNA) biopsies. 2.  Risks (bleeding, infection, bowel perforation that could require surgery, sedation-related changes in cardiopulmonary systems), benefits (identification and possible treatment of source of symptoms, exclusion of certain causes of symptoms), and alternatives (watchful waiting, radiographic imaging studies, empiric medical treatment) of upper endoscopy with ultrasound and possible biopsies (EUS +/- FNA) were explained to patient/family in detail and patient wishes to proceed.

## 2015-05-28 NOTE — Transfer of Care (Signed)
Immediate Anesthesia Transfer of Care Note  Patient: Nicole Sherman  Procedure(s) Performed: Procedure(s): UPPER ENDOSCOPIC ULTRASOUND (EUS) LINEAR (N/A) ENDOSCOPIC RETROGRADE CHOLANGIOPANCREATOGRAPHY (ERCP) (N/A)  Patient Location: PACU  Anesthesia Type:MAC  Level of Consciousness:  sedated, patient cooperative and responds to stimulation  Airway & Oxygen Therapy:Patient Spontanous Breathing and Patient connected to face mask oxgen  Post-op Assessment:  Report given to PACU RN and Post -op Vital signs reviewed and stable  Post vital signs:  Reviewed and stable  Last Vitals:  Filed Vitals:   05/28/15 1205  BP: 131/75  Pulse: 80  Temp: 36.7 C  Resp: 16    Complications: No apparent anesthesia complications

## 2015-05-28 NOTE — Addendum Note (Signed)
Addended by: Veva Grimley on: 05/28/2015 09:18 AM   Modules accepted: Orders  

## 2015-05-28 NOTE — Discharge Instructions (Signed)

## 2015-05-28 NOTE — Anesthesia Postprocedure Evaluation (Signed)
Anesthesia Post Note  Patient: Nicole Sherman  Procedure(s) Performed: Procedure(s) (LRB): UPPER ENDOSCOPIC ULTRASOUND (EUS) LINEAR (N/A) ENDOSCOPIC RETROGRADE CHOLANGIOPANCREATOGRAPHY (ERCP) (N/A)  Patient location during evaluation: PACU Anesthesia Type: MAC Level of consciousness: awake and alert Pain management: pain level controlled Vital Signs Assessment: post-procedure vital signs reviewed and stable Respiratory status: spontaneous breathing, nonlabored ventilation, respiratory function stable and patient connected to nasal cannula oxygen Cardiovascular status: stable and blood pressure returned to baseline Anesthetic complications: no    Last Vitals:  Filed Vitals:   05/28/15 1205  BP: 131/75  Pulse: 80  Temp: 36.7 C  Resp: 16    Last Pain: There were no vitals filed for this visit.               Kristyl Athens

## 2015-05-28 NOTE — Op Note (Signed)
[facility] [place], [address]   ENDOSCOPIC ULTRASOUND PROCEDURE REPORT  PATIENT: Nicole Sherman, Nicole Sherman  MR#: [id number] BIRTHDATE: January 03, 1934  GENDER: female ENDOSCOPIST: Arta Silence, MD REFERRED BY:  Laurence Spates, M.D.; [name], M.D. PROCEDURE DATE:  05/28/2015 PROCEDURE:   Upper EUS w/FNA ASA CLASS:      Class II INDICATIONS:   1.  pancreatic mass, weight loss. MEDICATIONS: Monitored anesthesia care, ciprofloxacin 400 mg IV  DESCRIPTION OF PROCEDURE:   After the risks benefits and alternatives of the procedure were  explained, informed consent was obtained. The patient was then placed in the left, lateral, decubitus postion and IV sedation was administered. Throughout the procedure, the patients blood pressure, pulse and oxygen saturations were monitored continuously.  Under direct visualization, the radial and then linear echoendoscopes were sequentially introduced through the mouth  and advanced to the second portion of the duodenum .  Water was used as necessary to provide an acoustic interface. Estimated blood loss is zero unless otherwise noted in this procedure report. Upon completion of the imaging, water was removed and the patient was sent to the recovery room in satisfactory condition.   FINDINGS:      68mm x 90mm hypoechoic ill-defined mass in body of pancreas was noted.  There was extensive intralesional necrosis, and upstream pancreatic ductal dilatation was also identified.  A few round well-defined hypoechoic malignant perilesional lymph nodes were noted.  Lesion invades celiac artery.  FNA biopsies x 3 were obtained with 25g needle for cytologic analysis.  IMPRESSION:     Pancreatic body mass.  Assuming this is adenocarcinoma, it would be staged T4 N1 Mx by EUS.  RECOMMENDATIONS:     1.  Watch for potential complications of procedure. 2.  Await cytology results. 3.  Based on CT and EUS findings, this lesion would not be  amenable to surgical resection. 4.  Follow-up with Dr. Burr Medico for ongoing management.   _______________________________ Lorrin Mais:  Arta Silence, MD 05/28/2015 2:31 PM   CC:

## 2015-05-30 ENCOUNTER — Ambulatory Visit: Payer: Medicare Other | Admitting: Nutrition

## 2015-05-30 ENCOUNTER — Ambulatory Visit (HOSPITAL_COMMUNITY)
Admission: RE | Admit: 2015-05-30 | Discharge: 2015-05-30 | Disposition: A | Discharge status: Home / Self Care | Payer: Medicare Other | Source: Ambulatory Visit | Attending: Hematology | Admitting: Hematology

## 2015-05-30 ENCOUNTER — Encounter (HOSPITAL_COMMUNITY): Payer: Self-pay | Admitting: Gastroenterology

## 2015-05-30 DIAGNOSIS — I700 Atherosclerosis of aorta: Secondary | ICD-10-CM | POA: Diagnosis not present

## 2015-05-30 DIAGNOSIS — C251 Malignant neoplasm of body of pancreas: Secondary | ICD-10-CM | POA: Insufficient documentation

## 2015-05-30 DIAGNOSIS — R634 Abnormal weight loss: Secondary | ICD-10-CM | POA: Insufficient documentation

## 2015-05-30 NOTE — Progress Notes (Signed)
82 year old female with suspected pancreas cancer.  She is a patient of Dr. Burr Medico.  Past medical history includes hypertension, hypothyroidism, diabetes, arthritis, and diverticulosis.  Medications include Synthroid and Glucophage.  Labs include glucose of 153 January 23.  Height: 66.5 inches. Weight: 148 pounds. Usual body weight: 160 pounds. BMI: 23.9.  Patient reports early satiety. She is eating less this week secondary to sore throat after recent procedure. She is drinking strawberry boost twice a day. Patient enjoys most foods and is open to trying a variety of foods.  Nutrition diagnosis: Unintended weight loss related to early satiety as evidenced by 12 pound weight loss from usual body weight.  Intervention: Patient educated on the importance of increasing calories and protein in small frequent meals and snacks. Reviewed ways for patient to add calories to food she enjoys Recommended patient continue boost plus twice a day and provided samples of boost, ensure and Carnation breakfast. Recommended patient try to maintain current weight. Provided fact sheets and coupons along with recipes for boost shakes. Questions were answered.  Teach back method used.  Contact information was provided.  Monitoring, evaluation, goals: Patient will tolerate increased calories and protein to minimize further weight loss.  Next visit: To be scheduled as needed.  **Disclaimer: This note was dictated with voice recognition software. Similar sounding words can inadvertently be transcribed and this note may contain transcription errors which may not have been corrected upon publication of note.**

## 2015-05-31 ENCOUNTER — Other Ambulatory Visit (HOSPITAL_BASED_OUTPATIENT_CLINIC_OR_DEPARTMENT_OTHER): Payer: Medicare Other

## 2015-05-31 ENCOUNTER — Ambulatory Visit (HOSPITAL_BASED_OUTPATIENT_CLINIC_OR_DEPARTMENT_OTHER): Payer: Medicare Other | Admitting: Hematology

## 2015-05-31 ENCOUNTER — Encounter: Payer: Self-pay | Admitting: Hematology

## 2015-05-31 ENCOUNTER — Telehealth: Payer: Self-pay | Admitting: Hematology

## 2015-05-31 VITALS — BP 148/76 | HR 106 | Temp 98.4°F | Resp 18 | Ht 66.0 in | Wt 150.8 lb

## 2015-05-31 DIAGNOSIS — E039 Hypothyroidism, unspecified: Secondary | ICD-10-CM

## 2015-05-31 DIAGNOSIS — C251 Malignant neoplasm of body of pancreas: Secondary | ICD-10-CM

## 2015-05-31 LAB — COMPREHENSIVE METABOLIC PANEL
ALT: <9 U/L (ref 0–55)
AST: 9 U/L (ref 5–34)
Albumin: 3.8 g/dL (ref 3.5–5.0)
Alkaline Phosphatase: 56 U/L (ref 40–150)
Anion Gap: 11 mEq/L (ref 3–11)
BUN: 14.2 mg/dL (ref 7.0–26.0)
CALCIUM: 10.1 mg/dL (ref 8.4–10.4)
CHLORIDE: 106 meq/L (ref 98–109)
CO2: 23 meq/L (ref 22–29)
Creatinine: 0.9 mg/dL (ref 0.6–1.1)
EGFR: 64 mL/min/{1.73_m2} — ABNORMAL LOW (ref >90)
Glucose: 229 mg/dl — ABNORMAL HIGH (ref 70–140)
POTASSIUM: 4.1 meq/L (ref 3.5–5.1)
SODIUM: 140 meq/L (ref 136–145)
Total Bilirubin: <0.3 mg/dL (ref 0.20–1.20)
Total Protein: 7.2 g/dL (ref 6.4–8.3)

## 2015-05-31 LAB — CBC WITH DIFFERENTIAL/PLATELET
BASO%: 1.5 % (ref 0.0–2.0)
BASOS ABS: 0.1 10*3/uL (ref 0.0–0.1)
EOS%: 2.3 % (ref 0.0–7.0)
Eosinophils Absolute: 0.1 10*3/uL (ref 0.0–0.5)
HEMATOCRIT: 34.3 % — AB (ref 34.8–46.6)
HGB: 10.9 g/dL — ABNORMAL LOW (ref 11.6–15.9)
LYMPH%: 26.4 % (ref 14.0–49.7)
MCH: 29 pg (ref 25.1–34.0)
MCHC: 31.9 g/dL (ref 31.5–36.0)
MCV: 90.9 fL (ref 79.5–101.0)
MONO#: 0.5 10*3/uL (ref 0.1–0.9)
MONO%: 9.7 % (ref 0.0–14.0)
NEUT#: 3.2 10*3/uL (ref 1.5–6.5)
NEUT%: 60.1 % (ref 38.4–76.8)
Platelets: 202 10*3/uL (ref 145–400)
RBC: 3.77 10*6/uL (ref 3.70–5.45)
RDW: 14.8 % — ABNORMAL HIGH (ref 11.2–14.5)
WBC: 5.4 10*3/uL (ref 3.9–10.3)
lymph#: 1.4 10*3/uL (ref 0.9–3.3)

## 2015-05-31 MED ORDER — TRAMADOL HCL 50 MG PO TABS: 50.0000 mg | ORAL_TABLET | Freq: Four times a day (QID) | ORAL | Status: DC | PRN

## 2015-05-31 MED ORDER — ONDANSETRON HCL 8 MG PO TABS: 8.0000 mg | ORAL_TABLET | Freq: Two times a day (BID) | ORAL | Status: DC | PRN

## 2015-05-31 NOTE — Telephone Encounter (Signed)
Gv pt appts for 1/30, 1/31, 2/7, + 2/14.

## 2015-05-31 NOTE — Progress Notes (Signed)
[place]  [phone] [phone]  Clinic Follow Up Note   Patient Care Team: Hulan Fess, MD as PCP - General (Family Medicine) [name], MD as Consulting Physician (Hematology) Arta Silence, MD as Consulting Physician (Gastroenterology) 05/31/2015  CHIEF COMPLAINTS:  Follow up unresectable pancreatic adenocarcinoma    HISTORY OF PRESENTING ILLNESS:  Nicole Sherman 82 y.o. female is here because of her recent abnormal CT scan, which showed a pink vaginal mass, highly suspicious for pancreatic cancer.  She has been haivng epigastric pain after meal for 4-5 months. She also reports left side pain at night when she sleeps on left side, mild back pain, the pain is worse lately, lasts longer especially afte rdinner, it about 5/10, appetite is lower than before, she lost aobut 10-20lbs in the past 5 months. No other complains, she had loose BM 1-2 time BM for 8-10 months, no hematochezia or melena. She was evaluated by her primary care physician Dr. Rex Kras, abdominal ultrasound was negative on 05/04/2015. She underwent a CT abdomen and pelvis with contrast on 05/16/2015, which showed a 4 x 1 cm mass in pancreatic body and a neck, tumor encases the distal celiac and proximal branches and occludes the main portal vein. No distant metastasis on the CT scan. She was referred to [place] for further workup.  She has good energy level, she is able to do all house work. She has intermittent dizziness from blood pressure meds. No other complains.   CURRENT THERAPY: Pending chemotherapy  INTERIM HISTORY: Sure returns for follow-up. She is accompanied her husband and 2 daughters. She underwent EUS and fine needle biopsy of the pancreatic mass, which unfortunately showed adenocarcinoma. She is here to discuss the treatment plan. Her epigastric and back pain is 5 out of 10, worse after meals. She has been taking Tylenol 5-6 tablets a day. No other new  complaints.  MEDICAL HISTORY:  Past Medical History  Diagnosis Date  . Hypertension   . Hypothyroidism   . Diabetes mellitus without complication ([place])   . Arthritis     osteoarthritis-hands wrist  . Diverticulosis     showing on CT of abdomen 05-16-15  . Cancer [facility])     pancreatic mass- dx. adenocarcinoma" CT abdomen 05-16-15    SURGICAL HISTORY: Past Surgical History  Procedure Laterality Date  . Joint replacement Right   . Shoulder arthroscopy w/ rotator cuff repair Left   . Shoulder open rotator cuff repair Right   . Tubal ligation    . Appendectomy      open  . Ankle surgery Right   . Eye surgery      macular hole surgery repair  . Cataract extraction Right   . Eus N/A 05/28/2015    Procedure: UPPER ENDOSCOPIC ULTRASOUND (EUS) LINEAR;  Surgeon: Arta Silence, MD;  Location: [facility];  Service: Endoscopy;  Laterality: N/A;    SOCIAL HISTORY: Social History   Social History  . Marital Status: Married    Spouse Name: N/A  . Number of Children: 2 daughters   . Years of Education: N/A   Occupational History  . Not on file.   Social History Main Topics  . Smoking status: Former Smoker -- 1.00 packs/day for 30 years    Types: Cigarettes    Quit date: 05/21/1986  . Smokeless tobacco: Not on file  . Alcohol Use: No  . Drug Use: No  . Sexual Activity: Not on file   Other Topics Concern  . Not  on file   Social History Narrative    FAMILY HISTORY: Family History  Problem Relation Age of Onset  . Stroke Mother   . Cancer Sister     lung cancer     ALLERGIES:  is allergic to aspirin.  MEDICATIONS:  Current Outpatient Prescriptions  Medication Sig Dispense Refill  . acetaminophen (TYLENOL) 500 MG tablet Take 1,000 mg by mouth every 6 (six) hours as needed (Pain).    Marland Kitchen levothyroxine (SYNTHROID, LEVOTHROID) 100 MCG tablet Take 100 mcg by mouth daily before breakfast.     . lisinopril (PRINIVIL,ZESTRIL) 20 MG tablet Take 20 mg by mouth daily.     .  metFORMIN (GLUCOPHAGE) 1000 MG tablet Take 1,000 mg by mouth 2 (two) times daily with a meal.     . traMADol (ULTRAM) 50 MG tablet Take 1 tablet (50 mg total) by mouth every 6 (six) hours as needed. 30 tablet 0   No current facility-administered medications for this visit.    REVIEW OF SYSTEMS:   Constitutional: Denies fevers, chills or abnormal night sweats Eyes: Denies blurriness of vision, double vision or watery eyes Ears, nose, mouth, throat, and face: Denies mucositis or sore throat Respiratory: Denies cough, dyspnea or wheezes Cardiovascular: Denies palpitation, chest discomfort or lower extremity swelling Gastrointestinal:  Denies nausea, heartburn or change in bowel habits Skin: Denies abnormal skin rashes Lymphatics: Denies new lymphadenopathy or easy bruising Neurological:Denies numbness, tingling or new weaknesses Behavioral/Psych: Mood is stable, no new changes  All other systems were reviewed with the patient and are negative.  PHYSICAL EXAMINATION: ECOG PERFORMANCE STATUS: 1 - Symptomatic but completely ambulatory  Filed Vitals:   05/31/15 1057  BP: 148/76  Pulse: 106  Temp: 98.4 F (36.9 C)  Resp: 18   Filed Weights   05/31/15 1057  Weight: 150 lb 12.8 oz (68.402 kg)    GENERAL:alert, no distress and comfortable SKIN: skin color, texture, turgor are normal, no rashes or significant lesions EYES: normal, conjunctiva are pink and non-injected, sclera clear OROPHARYNX:no exudate, no erythema and lips, buccal mucosa, and tongue normal  NECK: supple, thyroid normal size, non-tender, without nodularity LYMPH:  no palpable lymphadenopathy in the cervical, axillary or inguinal LUNGS: clear to auscultation and percussion with normal breathing effort HEART: regular rate & rhythm and no murmurs and no lower extremity edema ABDOMEN:abdomen soft, non-tender and normal bowel sounds Musculoskeletal:no cyanosis of digits and no clubbing  PSYCH: alert & oriented x 3 with  fluent speech NEURO: no focal motor/sensory deficits  LABORATORY DATA:  I have reviewed the data as listed  CBC Latest Ref Rng 05/31/2015 04/17/2007 04/16/2007  WBC 3.9 - 10.3 10e3/uL 5.4 9.1 7.8  Hemoglobin 11.6 - 15.9 g/dL 10.9(L) 10.6(L) 9.9(L)  Hematocrit 34.8 - 46.6 % 34.3(L) 30.6(L) 29.0(L)  Platelets 145 - 400 10e3/uL 202 247 200   CMP Latest Ref Rng 05/31/2015 04/16/2007 04/15/2007  Glucose 70 - 140 mg/dl 229(H) 142(H) 178(H)  BUN 7.0 - 26.0 mg/dL 14.2 8 16   Creatinine 0.6 - 1.1 mg/dL 0.9 0.52 0.57  Sodium 136 - 145 mEq/L 140 138 136  Potassium 3.5 - 5.1 mEq/L 4.1 4.1 4.5  Chloride - - 102 101  CO2 22 - 29 mEq/L 23 32 29  Calcium 8.4 - 10.4 mg/dL 10.1 8.7 8.5  Total Protein 6.4 - 8.3 g/dL 7.2 - -  Total Bilirubin 0.20 - 1.20 mg/dL <0.30 - -  Alkaline Phos 40 - 150 U/L 56 - -  AST 5 - 34 U/L 9 - -  ALT 0 - 55 U/L <9 - -     PATHOLOGY REPORT  Diagnosis 05/28/2015 FINE NEEDLE ASPIRATION: NEEDLE ASPIRATION, PANCREAS BODY (SPECIMEN 1 OF 1 COLLECTED 05/28/15): WELL DIFFERENTIATED ADENOCARCINOMA. Preliminary Diagnosis Intraoperative Diagnosis: Adequate (JDP)   RADIOGRAPHIC STUDIES: I have personally reviewed the radiological images as listed and agreed with the findings in the report. Ct Chest Wo Contrast  05/30/2015  CLINICAL DATA:  Pancreatic cancer. Weight loss. Evaluate for metastatic disease. EXAM: CT CHEST WITHOUT CONTRAST TECHNIQUE: Multidetector CT imaging of the chest was performed following the standard protocol without IV contrast. COMPARISON:  None. FINDINGS: Mediastinum/Nodes: No chest wall mass, breast masses, supraclavicular or axillary lymphadenopathy. The heart is normal in size. No pericardial effusion. There is tortuosity, ectasia and calcification of the thoracic aorta. No focal aneurysm. Three-vessel coronary artery calcifications are noted. No mediastinal or hilar mass or adenopathy. The esophagus is grossly normal. Lungs/Pleura: Emphysematous changes are  noted. Some breathing motion artifact but no obvious pulmonary nodules to suggest pulmonary metastatic disease. No worrisome pulmonary lesions or acute pulmonary findings. No pleural effusion or pleural thickening. Upper abdomen: Pancreatic body mass is again demonstrated. Advanced atherosclerotic calcifications involving the aorta. Musculoskeletal: Moderate degenerative changes involving the thoracic spine. No acute bony abnormality or findings for metastatic disease. Surgical changes involving the right shoulder. IMPRESSION: 1. No CT findings for metastatic disease involving the chest. 2. Mild emphysematous changes. 3. Advanced atherosclerotic calcifications involving the thoracic and abdominal aorta. Electronically Signed   By: Marijo Sanes M.D.   On: 05/30/2015 08:28   Ct Abdomen Pelvis W Contrast  05/16/2015  CLINICAL DATA:  Generalized abdominal pain for 4 months. EXAM: CT ABDOMEN AND PELVIS WITH CONTRAST TECHNIQUE: Multidetector CT imaging of the abdomen and pelvis was performed using the standard protocol following bolus administration of intravenous contrast. Creatinine was obtained on site at [place] at [address] Results: Creatinine 0.6 mg/dL. CONTRAST:  175mL ISOVUE-300 IOPAMIDOL (ISOVUE-300) INJECTION 61% COMPARISON:  None. FINDINGS: Lower chest and abdominal wall:  No contributory findings. Hepatobiliary: No evidence of metastasis. Ill-defined hypervascularity in segment 5/6 on series 2, image 28 is likely a perfusion anomaly. Benign cyst in a lower right liver.No evidence of biliary obstruction or stone. Dilated biliary tree and gallbladder above the pancreatic head mass. Pancreas: There is a bulky hypo enhancing mass in the midline pancreatic body and neck measuring up to 41 mm. Soft tissue encases the distal celiac axis and its proximal branches. Occluded portal vein at the pancreatic head with reconstitution from channels. Small nonocclusive thrombus present beyond the  occlusion. Marked proximal duct dilatation with atrophy. Enlarged peripancreatic lymph nodes up to 11 mm short axis superior to the main portal vein. No evidence of peritoneal tumor. Spleen: Unremarkable. Adrenals/Urinary Tract: Negative adrenals. No hydronephrosis or stone. Unremarkable bladder. Reproductive:No pathologic findings. Stomach/Bowel:  Extensive colonic diverticulosis. Vascular/Lymphatic: Tumor related findings described above. There are varices in the proximal stomach. No mass or adenopathy. Peritoneal: No ascites or pneumoperitoneum. Musculoskeletal: No acute abnormalities. These results will be called to the ordering clinician or representative by the Radiologist Assistant, and communication documented in the PACS or zVision Dashboard. IMPRESSION: 1. Bulky pancreatic mass consistent with adenocarcinoma. Tumor encases the distal celiac and its proximal branches and occludes the main portal vein. Peripancreatic lymphadenopathy. 2. Biliary obstruction. 3. Proximal gastric varices. Electronically Signed   By: Monte Fantasia M.D.   On: 05/16/2015 13:22   US Abdomen Limited  05/04/2015  CLINICAL DATA:  Postprandial epigastric region pain  EXAM: US ABDOMEN LIMITED - RIGHT UPPER QUADRANT COMPARISON:  None. FINDINGS: Gallbladder: No gallstones or wall thickening visualized. No pericholecystic fluid. A small fold in the gallbladder is an anatomic variant. No sonographic Murphy sign noted by sonographer. Common bile duct: Diameter: 5 mm. No intrahepatic or extrahepatic biliary duct dilatation. Liver: There is a cyst in the right lobe of the liver inferiorly measuring 2.2 x 1.7 x 2.3 cm. No other focal liver lesion identified. Within normal limits in parenchymal echogenicity. IMPRESSION: Right lobe liver cyst. No other liver lesion appreciable. No demonstrable gallbladder pathology. No biliary duct dilatation appreciable. Electronically Signed   By: Lowella Grip III M.D.   On: 05/04/2015 08:39     ASSESSMENT & PLAN:  82 year old Caucasian female, presented with intermittent abdominal pain, weight loss, and a CT finding of a pancreatic mass.  1. Pancreatic body/head adenocarcinoma, well differentiated, cT2N1M0, stage IIB, unresectable -I reviewed her CT chest results, which was negative for metastatic disease. -The biopsy results was reviewed with her and her family members in detail. -Her case was discussed in our GI tumor Board yesterday, Dr. Barry Dienes feels this is not resectable disease. -I reviewed the nature history of pancreatic cancer, which is progressive. Her disease is incurable at this stage, and the goal of therapy is palliative and prolong her life. -We discussed the median survival (likely less than 6 month) without therapy, and the benefit of chemotherapy, per patient's request. -I recommend palliative systemic chemotherapy -Given her advanced age, she may not be a good candidate for intensive chemotherapy such as FOLFIRINOX. However she does have very good performance status, and preserved organ function, single agent gemcitabine would be reasonable. If she tolerates well, we may try to add Abraxane.  -If she develops worsening pain, we may also consider palliative radiation or celiac nerve block.  -After the lengthy discussion with patient and her family, she decides to try single agent gemcitabine first.  2. HTN, DM, hypothyroidism  -follow up with PCP  -We will monitor her blood pressure and blood glucose closely during her therapy, which may be affected by dehydration, and steroids as pre-meds.   Plan -chemo class  -start weekly gemcitabine next week  -I will see her back on week 2 chenmo  All questions were answered. The patient knows to call the clinic with any problems, questions or concerns. I spent 30 minutes counseling the patient face to face. The total time spent in the appointment was 35 minutes and more than 50% was on counseling.     [name],  MD 05/31/2015

## 2015-06-01 LAB — CANCER ANTIGEN 19-9: CA 19-9: 751 U/mL — ABNORMAL HIGH (ref 0–35)

## 2015-06-04 ENCOUNTER — Ambulatory Visit: Payer: Medicare Other | Admitting: Hematology

## 2015-06-04 ENCOUNTER — Encounter: Payer: Medicare Other | Admitting: Nutrition

## 2015-06-04 ENCOUNTER — Other Ambulatory Visit: Payer: Medicare Other

## 2015-06-04 ENCOUNTER — Encounter: Payer: Self-pay | Admitting: Hematology

## 2015-06-04 NOTE — Progress Notes (Signed)
Met with patient and family after chemo-ed class to introduce myself as her Estate manager/land agent. Asked patient if she has any financial questions or concerns. Patient and family state they are sure they will have some questions but haven't got them all together right now. Asked if they may possibly be interested in applying for copay assistance for chemotherapy. They said it wouldn't hurt to try. I gave them a printed application for [employer] who currently has funding for her diagnosis for them to read over and they can complete and return to me tomorrow. I can do application online but wanted to give them material to read over. Family states they will be with the patient tomorrow and bring completed application back to me tomorrow.

## 2015-06-05 ENCOUNTER — Encounter: Payer: Self-pay | Admitting: Hematology

## 2015-06-05 ENCOUNTER — Ambulatory Visit (HOSPITAL_BASED_OUTPATIENT_CLINIC_OR_DEPARTMENT_OTHER): Payer: Medicare Other

## 2015-06-05 ENCOUNTER — Other Ambulatory Visit (HOSPITAL_BASED_OUTPATIENT_CLINIC_OR_DEPARTMENT_OTHER): Payer: Medicare Other

## 2015-06-05 ENCOUNTER — Encounter: Payer: Self-pay | Admitting: *Deleted

## 2015-06-05 VITALS — BP 104/61 | HR 81 | Temp 98.0°F

## 2015-06-05 DIAGNOSIS — C251 Malignant neoplasm of body of pancreas: Secondary | ICD-10-CM | POA: Diagnosis not present

## 2015-06-05 DIAGNOSIS — Z5111 Encounter for antineoplastic chemotherapy: Secondary | ICD-10-CM

## 2015-06-05 LAB — COMPREHENSIVE METABOLIC PANEL
ALT: 14 U/L (ref 0–55)
AST: 13 U/L (ref 5–34)
Albumin: 3.7 g/dL (ref 3.5–5.0)
Alkaline Phosphatase: 53 U/L (ref 40–150)
Anion Gap: 15 mEq/L — ABNORMAL HIGH (ref 3–11)
BUN: 12.7 mg/dL (ref 7.0–26.0)
CHLORIDE: 105 meq/L (ref 98–109)
CO2: 20 meq/L — AB (ref 22–29)
CREATININE: 0.8 mg/dL (ref 0.6–1.1)
Calcium: 9.8 mg/dL (ref 8.4–10.4)
EGFR: 66 mL/min/{1.73_m2} — ABNORMAL LOW (ref >90)
GLUCOSE: 169 mg/dL — AB (ref 70–140)
Potassium: 4.1 mEq/L (ref 3.5–5.1)
SODIUM: 140 meq/L (ref 136–145)
TOTAL PROTEIN: 7 g/dL (ref 6.4–8.3)

## 2015-06-05 LAB — CBC WITH DIFFERENTIAL/PLATELET
BASO%: 1 % (ref 0.0–2.0)
Basophils Absolute: 0 10*3/uL (ref 0.0–0.1)
EOS%: 2.1 % (ref 0.0–7.0)
Eosinophils Absolute: 0.1 10*3/uL (ref 0.0–0.5)
HCT: 33.9 % — ABNORMAL LOW (ref 34.8–46.6)
HGB: 10.8 g/dL — ABNORMAL LOW (ref 11.6–15.9)
LYMPH%: 27.2 % (ref 14.0–49.7)
MCH: 29.1 pg (ref 25.1–34.0)
MCHC: 31.9 g/dL (ref 31.5–36.0)
MCV: 91.4 fL (ref 79.5–101.0)
MONO#: 0.7 10*3/uL (ref 0.1–0.9)
MONO%: 13.2 % (ref 0.0–14.0)
NEUT%: 56.5 % (ref 38.4–76.8)
NEUTROS ABS: 2.9 10*3/uL (ref 1.5–6.5)
PLATELETS: 180 10*3/uL (ref 145–400)
RBC: 3.71 10*6/uL (ref 3.70–5.45)
RDW: 14.8 % — ABNORMAL HIGH (ref 11.2–14.5)
WBC: 5.1 10*3/uL (ref 3.9–10.3)
lymph#: 1.4 10*3/uL (ref 0.9–3.3)

## 2015-06-05 MED ORDER — SODIUM CHLORIDE 0.9 % IV SOLN
1800.0000 mg | Freq: Once | INTRAVENOUS | Status: AC
  Administered 2015-06-05: 1800 mg via INTRAVENOUS
  Filled 2015-06-05: qty 47.34

## 2015-06-05 MED ORDER — PROCHLORPERAZINE MALEATE 10 MG PO TABS
10.0000 mg | ORAL_TABLET | Freq: Once | ORAL | Status: AC
  Administered 2015-06-05: 10 mg via ORAL

## 2015-06-05 MED ORDER — SODIUM CHLORIDE 0.9 % IV SOLN
Freq: Once | INTRAVENOUS | Status: AC
  Administered 2015-06-05: 15:00:00 via INTRAVENOUS

## 2015-06-05 MED ORDER — PROCHLORPERAZINE MALEATE 10 MG PO TABS
ORAL_TABLET | ORAL | Status: AC
  Filled 2015-06-05: qty 1

## 2015-06-05 NOTE — Progress Notes (Signed)
Patient's daughter returned [employer] application to me. I submitted this information via online application thru [employer]. Patient approved for copay assistance from 06/05/15-05/04/16 in the amount of $6,000. I gave patient a copy of the confirmation and explained once she gets the card and letter in the mail to bring to me to copy and send to billing. Patient verbalized understanding and explained to spouse as well. They both thanked me.

## 2015-06-05 NOTE — Progress Notes (Signed)
Oncology Nurse Navigator Documentation  Oncology Nurse Navigator Flowsheets 06/05/2015  Navigator Location [facility]  Navigator Encounter Type Treatment  Abnormal Finding Date -  Treatment Initiated Date 06/05/2015  Patient Visit Type MedOnc  Treatment Phase First Chemo Tx-Gemzar  Barriers/Navigation Needs No barriers at this time;No Questions;No Needs  Education Other--remined to call nurse if IV site hurts during infusion-can piggyback IVF to help dilute drug  Interventions None required  Referrals -  Coordination of Care -  Education Method Verbal  Support Groups/Services -  Acuity Level 1  Time Spent with Patient 15

## 2015-06-05 NOTE — Patient Instructions (Signed)
[facility] Discharge Instructions for Patients Receiving Chemotherapy  Today you received the following chemotherapy agents Gemzar  To help prevent nausea and vomiting after your treatment, we encourage you to take your nausea medication as prescribed   If you develop nausea and vomiting that is not controlled by your nausea medication, call the clinic.   BELOW ARE SYMPTOMS THAT SHOULD BE REPORTED IMMEDIATELY:  *FEVER GREATER THAN 100.5 F  *CHILLS WITH OR WITHOUT FEVER  NAUSEA AND VOMITING THAT IS NOT CONTROLLED WITH YOUR NAUSEA MEDICATION  *UNUSUAL SHORTNESS OF BREATH  *UNUSUAL BRUISING OR BLEEDING  TENDERNESS IN MOUTH AND THROAT WITH OR WITHOUT PRESENCE OF ULCERS  *URINARY PROBLEMS  *BOWEL PROBLEMS  UNUSUAL RASH Items with * indicate a potential emergency and should be followed up as soon as possible.  Feel free to call the clinic you have any questions or concerns. The clinic phone number is [phone].  Please show the CHEMO ALERT CARD at check-in to the Emergency Department and triage nurse.   

## 2015-06-06 ENCOUNTER — Telehealth: Payer: Self-pay | Admitting: *Deleted

## 2015-06-06 NOTE — Telephone Encounter (Signed)
-----   Message from Renford Dills, RN sent at 06/05/2015  2:50 PM EST ----- Regarding: chemo f/u  Dr. Burr Medico 1st Gemzar

## 2015-06-06 NOTE — Telephone Encounter (Signed)
Called pt & she has taken 1/2 of tramadol & slept & pain has gone away.  She reports no other symptoms except heavy feeling in legs last night & one normal stool this am & one loose stool.  Informed that we would let Dr Burr Medico know about her pain & to call if symptoms return or worse.  She expressed understanding.

## 2015-06-06 NOTE — Telephone Encounter (Signed)
FYI Pager: "1st Chemo yesterday.  Not feeling well.  Call [phone] ASAP." Reached patient at correct number of [phone].  "I feel okay just sleepy. Stomach real sore.  Not a pain just sore all over entire stomach from one side to the next.  I need to know is this normal.  justa heavy pain all day. Took ultram at 0900 this morning.  Slept in recliner a little and have been up ever since 11:15 am." Admits able to eat and drink with no n/v.  Bladder and bowels working well.  Bowels have moved twice today.  No abdominal bloating or swelling.   Advised she repeat the ultram as six hours have passed.  Soreness is related to tumor type but will notify provider.  If any orders or instructions return number [phone].

## 2015-06-08 ENCOUNTER — Emergency Department (HOSPITAL_COMMUNITY)
Admission: EM | Admit: 2015-06-08 | Discharge: 2015-06-08 | Disposition: A | Discharge status: Home / Self Care | Payer: Medicare Other | Attending: Emergency Medicine | Admitting: Emergency Medicine

## 2015-06-08 ENCOUNTER — Encounter (HOSPITAL_COMMUNITY): Payer: Self-pay | Admitting: Nurse Practitioner

## 2015-06-08 ENCOUNTER — Telehealth: Payer: Self-pay | Admitting: *Deleted

## 2015-06-08 DIAGNOSIS — E039 Hypothyroidism, unspecified: Secondary | ICD-10-CM | POA: Diagnosis not present

## 2015-06-08 DIAGNOSIS — Z79899 Other long term (current) drug therapy: Secondary | ICD-10-CM | POA: Diagnosis not present

## 2015-06-08 DIAGNOSIS — I10 Essential (primary) hypertension: Secondary | ICD-10-CM | POA: Diagnosis not present

## 2015-06-08 DIAGNOSIS — C259 Malignant neoplasm of pancreas, unspecified: Secondary | ICD-10-CM | POA: Insufficient documentation

## 2015-06-08 DIAGNOSIS — Z8719 Personal history of other diseases of the digestive system: Secondary | ICD-10-CM | POA: Insufficient documentation

## 2015-06-08 DIAGNOSIS — Z87891 Personal history of nicotine dependence: Secondary | ICD-10-CM | POA: Insufficient documentation

## 2015-06-08 DIAGNOSIS — E119 Type 2 diabetes mellitus without complications: Secondary | ICD-10-CM | POA: Diagnosis not present

## 2015-06-08 DIAGNOSIS — Z7984 Long term (current) use of oral hypoglycemic drugs: Secondary | ICD-10-CM | POA: Diagnosis not present

## 2015-06-08 DIAGNOSIS — L03116 Cellulitis of left lower limb: Secondary | ICD-10-CM | POA: Diagnosis not present

## 2015-06-08 DIAGNOSIS — M79662 Pain in left lower leg: Secondary | ICD-10-CM | POA: Diagnosis present

## 2015-06-08 DIAGNOSIS — M130 Polyarthritis, unspecified: Secondary | ICD-10-CM | POA: Diagnosis not present

## 2015-06-08 LAB — I-STAT CHEM 8, ED
BUN: 18 mg/dL (ref 6–20)
CREATININE: 0.7 mg/dL (ref 0.44–1.00)
Calcium, Ion: 1.22 mmol/L (ref 1.13–1.30)
Chloride: 97 mmol/L — ABNORMAL LOW (ref 101–111)
Glucose, Bld: 187 mg/dL — ABNORMAL HIGH (ref 65–99)
HEMATOCRIT: 29 % — AB (ref 36.0–46.0)
HEMOGLOBIN: 9.9 g/dL — AB (ref 12.0–15.0)
POTASSIUM: 4 mmol/L (ref 3.5–5.1)
SODIUM: 133 mmol/L — AB (ref 135–145)
TCO2: 24 mmol/L (ref 0–100)

## 2015-06-08 LAB — CBC WITH DIFFERENTIAL/PLATELET
BASOS ABS: 0 10*3/uL (ref 0.0–0.1)
BASOS PCT: 0 %
EOS ABS: 0 10*3/uL (ref 0.0–0.7)
Eosinophils Relative: 0 %
HEMATOCRIT: 29.3 % — AB (ref 36.0–46.0)
HEMOGLOBIN: 9.4 g/dL — AB (ref 12.0–15.0)
Lymphocytes Relative: 9 %
Lymphs Abs: 0.6 10*3/uL — ABNORMAL LOW (ref 0.7–4.0)
MCH: 29.1 pg (ref 26.0–34.0)
MCHC: 32.1 g/dL (ref 30.0–36.0)
MCV: 90.7 fL (ref 78.0–100.0)
MONO ABS: 0.1 10*3/uL (ref 0.1–1.0)
Monocytes Relative: 1 %
NEUTROS ABS: 6.1 10*3/uL (ref 1.7–7.7)
NEUTROS PCT: 90 %
Platelets: 153 10*3/uL (ref 150–400)
RBC: 3.23 MIL/uL — ABNORMAL LOW (ref 3.87–5.11)
RDW: 13.9 % (ref 11.5–15.5)
WBC: 6.8 10*3/uL (ref 4.0–10.5)

## 2015-06-08 MED ORDER — CEPHALEXIN 250 MG PO CAPS
250.0000 mg | ORAL_CAPSULE | Freq: Once | ORAL | Status: AC
  Administered 2015-06-08: 250 mg via ORAL
  Filled 2015-06-08: qty 1

## 2015-06-08 MED ORDER — ENOXAPARIN SODIUM 80 MG/0.8ML ~~LOC~~ SOLN
1.0000 mg/kg | Freq: Once | SUBCUTANEOUS | Status: AC
  Administered 2015-06-08: 70 mg via SUBCUTANEOUS
  Filled 2015-06-08: qty 0.8

## 2015-06-08 MED ORDER — CEPHALEXIN 250 MG PO CAPS: 250.0000 mg | ORAL_CAPSULE | Freq: Four times a day (QID) | ORAL | Status: DC

## 2015-06-08 NOTE — Telephone Encounter (Signed)
Called patient offering [facility] visit.  Resting, spouse "aroused her for this call". "I'm doing better today than I was yesterday.  I can at least walk around.  I am already scheduled to see her Tuesday.  I will wait until Tuesday."  Has eaten small amount today.  Encouraged to drink, take anti-emetic and utram if needed.  No further complaints or questions.

## 2015-06-08 NOTE — ED Notes (Addendum)
1st chemo tx Tuesday of this week, Wednesday began having tightness to L calf, per family area was warm and red. Now redness and warmth has moved to medial L ankle radiating up front of leg. Quarter size green area noted to front on L  Lower leg, pt unaware of any trauma to area. Tender to touch, +pulses +movement

## 2015-06-08 NOTE — ED Notes (Signed)
Pt presents with c/o left calf pain, warm to touch with tenderness, sent in by the cancer on call center to r/o DVT, pt had chemo on Tuesday, denying shOB, chest pain or pertinent symptoms.

## 2015-06-08 NOTE — ED Provider Notes (Signed)
CSN: [id number]     Arrival date & time 06/08/15  1948 History   First MD Initiated Contact with Patient 06/08/15 2026     Chief Complaint  Patient presents with  . R/O DVT    . CA Patient   . Calf Pain      (Consider location/radiation/quality/duration/timing/severity/associated sxs/prior Treatment) HPI Comments: Patient is a 82 year old female with a history of newly diagnosed pancreatic cancer who started chemotherapy on Tuesday of this week. Since that time she has felt fatigue but denies any nausea vomiting or diarrhea. Patient came in today because she noticed left lower leg pain and swelling.  Patient initially complained of pain in her left calf but now it is more in the front of her leg and she has erythema, warmth and swelling. Patient denies any chest pain or shortness of breath. She denies any prior history of DVT or cellulitis. She denies any fevers and has not been taking any Tylenol or IV for a from which would mask fever.  The history is provided by the patient.    Past Medical History  Diagnosis Date  . Hypertension   . Hypothyroidism   . Diabetes mellitus without complication ([place])   . Arthritis     osteoarthritis-hands wrist  . Diverticulosis     showing on CT of abdomen 05-16-15  . Cancer [facility])     pancreatic mass- dx. adenocarcinoma" CT abdomen 05-16-15   Past Surgical History  Procedure Laterality Date  . Joint replacement Right   . Shoulder arthroscopy w/ rotator cuff repair Left   . Shoulder open rotator cuff repair Right   . Tubal ligation    . Appendectomy      open  . Ankle surgery Right   . Eye surgery      macular hole surgery repair  . Cataract extraction Right   . Eus N/A 05/28/2015    Procedure: UPPER ENDOSCOPIC ULTRASOUND (EUS) LINEAR;  Surgeon: Arta Silence, MD;  Location: [facility];  Service: Endoscopy;  Laterality: N/A;   Family History  Problem Relation Age of Onset  . Stroke Mother   . Cancer Sister     lung cancer    Social  History  Substance Use Topics  . Smoking status: Former Smoker -- 1.00 packs/day for 30 years    Types: Cigarettes    Quit date: 05/21/1986  . Smokeless tobacco: None  . Alcohol Use: No   OB History    No data available     Review of Systems  All other systems reviewed and are negative.     Allergies  Aspirin  Home Medications   Prior to Admission medications   Medication Sig Start Date End Date Taking? Authorizing Provider  levothyroxine (SYNTHROID, LEVOTHROID) 100 MCG tablet Take 100 mcg by mouth daily before breakfast.  05/14/15  Yes Historical Provider, MD  lisinopril (PRINIVIL,ZESTRIL) 20 MG tablet Take 20 mg by mouth daily.  05/14/15  Yes Historical Provider, MD  metFORMIN (GLUCOPHAGE) 1000 MG tablet Take 1,000 mg by mouth 2 (two) times daily with a meal.  03/12/15  Yes Historical Provider, MD  omeprazole (PRILOSEC OTC) 20 MG tablet Take 20 mg by mouth daily as needed (acid reflux).   Yes Historical Provider, MD  ondansetron (ZOFRAN) 8 MG tablet Take 1 tablet (8 mg total) by mouth 2 (two) times daily as needed (Nausea or vomiting). 05/31/15  Yes [name], MD  traMADol (ULTRAM) 50 MG tablet Take 1 tablet (50 mg total) by mouth every  6 (six) hours as needed. Patient taking differently: Take 25-50 mg by mouth every 6 (six) hours as needed for moderate pain.  05/31/15  Yes [name], MD  cephALEXin (KEFLEX) 250 MG capsule Take 1 capsule (250 mg total) by mouth 4 (four) times daily. 06/08/15   Blanchie Dessert, MD   BP 113/66 mmHg  Pulse 101  Temp(Src) 98.2 F (36.8 C) (Oral)  Resp 16  SpO2 94% Physical Exam  Constitutional: She is oriented to person, place, and time. She appears well-developed and well-nourished. No distress.  HENT:  Head: Normocephalic and atraumatic.  Mouth/Throat: Oropharynx is clear and moist.  Eyes: Conjunctivae and EOM are normal. Pupils are equal, round, and reactive to light.  Neck: Normal range of motion. Neck supple.  Cardiovascular: Normal rate,  regular rhythm and intact distal pulses.   No murmur heard. Pulmonary/Chest: Effort normal and breath sounds normal. No respiratory distress. She has no wheezes. She has no rales.  Abdominal: Soft. She exhibits no distension. There is no tenderness. There is no rebound and no guarding.  Musculoskeletal: Normal range of motion. She exhibits tenderness. She exhibits no edema.       Legs: Neurological: She is alert and oriented to person, place, and time.  Skin: Skin is warm and dry. No rash noted. No erythema.  Psychiatric: She has a normal mood and affect. Her behavior is normal.  Nursing note and vitals reviewed.   ED Course  Procedures (including critical care time) Labs Review Labs Reviewed  CBC WITH DIFFERENTIAL/PLATELET - Abnormal; Notable for the following:    RBC 3.23 (*)    Hemoglobin 9.4 (*)    HCT 29.3 (*)    Lymphs Abs 0.6 (*)    All other components within normal limits  I-STAT CHEM 8, ED - Abnormal; Notable for the following:    Sodium 133 (*)    Chloride 97 (*)    Glucose, Bld 187 (*)    Hemoglobin 9.9 (*)    HCT 29.0 (*)    All other components within normal limits    Imaging Review No results found. I have personally reviewed and evaluated these images and lab results as part of my medical decision-making.   EKG Interpretation None      MDM   Final diagnoses:  Cellulitis of left lower extremity    Patient with a history of pancreatic cancer who recently started on chemotherapy earlier this week presents today with pain swelling and redness to the left lower leg. She was sent here for further evaluation. Patient denies any chest pain or shortness of breath. She has been fatigued since starting the chemotherapy but otherwise is doing okay. On exam patient has erythema, red streaking and tenderness to the left lower extremity more concerning for cellulitis. However does have some mild calf tenderness and on bedside ultrasound cannot rule out DVT. Patient's  CBC and Chem-8 are without acute change. No signs of neutropenia and patient denies fever.  Patient was started on Keflex for concern for cellulitis. Also she was given a dose of Lovenox and will return in the morning for DVT study patient is a 82 year old female with a history of pancreatic cancer who started chemotherapy this week. Lanelle Bal, MD 06/09/15 0001

## 2015-06-08 NOTE — Discharge Instructions (Signed)
Cellulitis °Cellulitis is an infection of the skin and the tissue under the skin. The infected area is usually red and tender. This happens most often in the arms and lower legs. °HOME CARE  °· Take your antibiotic medicine as told. Finish the medicine even if you start to feel better. °· Keep the infected arm or leg raised (elevated). °· Put a warm cloth on the area up to 4 times per day. °· Only take medicines as told by your doctor. °· Keep all doctor visits as told. °GET HELP IF: °· You see red streaks on the skin coming from the infected area. °· Your red area gets bigger or turns a dark color. °· Your bone or joint under the infected area is painful after the skin heals. °· Your infection comes back in the same area or different area. °· You have a puffy (swollen) bump in the infected area. °· You have new symptoms. °· You have a fever. °GET HELP RIGHT AWAY IF:  °· You feel very sleepy. °· You throw up (vomit) or have watery poop (diarrhea). °· You feel sick and have muscle aches and pains. °  °This information is not intended to replace advice given to you by your health care provider. Make sure you discuss any questions you have with your health care provider. °  °Document Released: 10/08/2007 Document Revised: 01/10/2015 Document Reviewed: 07/07/2011 °Elsevier Interactive Patient Education ©2016 Elsevier Inc[employer] ° °

## 2015-06-08 NOTE — ED Notes (Signed)
Red area to LE marked with surgical marker

## 2015-06-08 NOTE — ED Notes (Signed)
Pt and family given instructions for f/u at [facility] for doppler as well as infection.

## 2015-06-08 NOTE — Telephone Encounter (Signed)
Myrtle,  Could you call her back to see if she needs to come in to see [facility] this afternoon?  Thanks,  Krista Blue

## 2015-06-08 NOTE — Telephone Encounter (Signed)
1. "I received chemo Tuesday.  I threw up Wednesday morning while drinking boost and last night with dinner.  Looked like the food.  I took ondansetron after I threw up.  Food nauseates me.   2. I'm tired and do not want to do anything but sleep. 3. My stomach is unbearably sore. 4. My left leg is sore and stretches from knee to ankle feels almost like a cramp."  Last bm was yesterday with some straining.  Encouraged to increase fluids to at least 64 oz trying hot beverages and apple juice.  Return number [phone].

## 2015-06-09 ENCOUNTER — Emergency Department (HOSPITAL_COMMUNITY)
Admission: EM | Admit: 2015-06-09 | Discharge: 2015-06-09 | Disposition: A | Discharge status: Home / Self Care | Payer: Medicare Other | Attending: Emergency Medicine | Admitting: Emergency Medicine

## 2015-06-09 ENCOUNTER — Encounter (HOSPITAL_COMMUNITY): Payer: Self-pay | Admitting: Emergency Medicine

## 2015-06-09 ENCOUNTER — Other Ambulatory Visit: Payer: Self-pay

## 2015-06-09 ENCOUNTER — Ambulatory Visit (HOSPITAL_BASED_OUTPATIENT_CLINIC_OR_DEPARTMENT_OTHER)
Admission: RE | Admit: 2015-06-09 | Discharge: 2015-06-09 | Disposition: A | Discharge status: Home / Self Care | Payer: Medicare Other | Source: Ambulatory Visit | Attending: Emergency Medicine | Admitting: Emergency Medicine

## 2015-06-09 DIAGNOSIS — Z79899 Other long term (current) drug therapy: Secondary | ICD-10-CM | POA: Insufficient documentation

## 2015-06-09 DIAGNOSIS — Z8507 Personal history of malignant neoplasm of pancreas: Secondary | ICD-10-CM | POA: Insufficient documentation

## 2015-06-09 DIAGNOSIS — M1990 Unspecified osteoarthritis, unspecified site: Secondary | ICD-10-CM | POA: Insufficient documentation

## 2015-06-09 DIAGNOSIS — M79605 Pain in left leg: Secondary | ICD-10-CM | POA: Diagnosis present

## 2015-06-09 DIAGNOSIS — Z7984 Long term (current) use of oral hypoglycemic drugs: Secondary | ICD-10-CM | POA: Diagnosis not present

## 2015-06-09 DIAGNOSIS — Z87891 Personal history of nicotine dependence: Secondary | ICD-10-CM | POA: Insufficient documentation

## 2015-06-09 DIAGNOSIS — M7989 Other specified soft tissue disorders: Secondary | ICD-10-CM

## 2015-06-09 DIAGNOSIS — I82432 Acute embolism and thrombosis of left popliteal vein: Secondary | ICD-10-CM | POA: Diagnosis not present

## 2015-06-09 DIAGNOSIS — Z8719 Personal history of other diseases of the digestive system: Secondary | ICD-10-CM | POA: Diagnosis not present

## 2015-06-09 DIAGNOSIS — Z792 Long term (current) use of antibiotics: Secondary | ICD-10-CM | POA: Insufficient documentation

## 2015-06-09 DIAGNOSIS — R531 Weakness: Secondary | ICD-10-CM | POA: Insufficient documentation

## 2015-06-09 DIAGNOSIS — Z86018 Personal history of other benign neoplasm: Secondary | ICD-10-CM | POA: Diagnosis not present

## 2015-06-09 DIAGNOSIS — E119 Type 2 diabetes mellitus without complications: Secondary | ICD-10-CM | POA: Insufficient documentation

## 2015-06-09 DIAGNOSIS — E039 Hypothyroidism, unspecified: Secondary | ICD-10-CM | POA: Diagnosis not present

## 2015-06-09 DIAGNOSIS — R21 Rash and other nonspecific skin eruption: Secondary | ICD-10-CM | POA: Diagnosis not present

## 2015-06-09 DIAGNOSIS — I10 Essential (primary) hypertension: Secondary | ICD-10-CM | POA: Diagnosis not present

## 2015-06-09 MED ORDER — ENOXAPARIN SODIUM 60 MG/0.6ML ~~LOC~~ SOLN
60.0000 mg | Freq: Once | SUBCUTANEOUS | Status: AC
  Administered 2015-06-09: 60 mg via SUBCUTANEOUS
  Filled 2015-06-09: qty 0.6

## 2015-06-09 MED ORDER — ENOXAPARIN SODIUM 100 MG/ML ~~LOC~~ SOLN: 1.0000 mg/kg | Freq: Two times a day (BID) | SUBCUTANEOUS | Status: DC

## 2015-06-09 MED ORDER — DIPHENHYDRAMINE HCL 25 MG PO CAPS
25.0000 mg | ORAL_CAPSULE | Freq: Once | ORAL | Status: AC
  Administered 2015-06-09: 25 mg via ORAL
  Filled 2015-06-09: qty 1

## 2015-06-09 NOTE — Discharge Instructions (Signed)
Deep Vein Thrombosis  It is okay to stop the antibiotic (keflex). Take Benadryl 25 mg every 6 hours as needed for itch. Start taking the blood thinner prescribed tomorrow. Call Dr.Feng in 2 days to arrange to be seen in the office next week.. Dr Burr Medico and arrange for you to get refills of the blood thinner A deep vein thrombosis (DVT) is a blood clot (thrombus) that usually occurs in a deep, larger vein of the lower leg or the pelvis, or in an upper extremity such as the arm. These are dangerous and can lead to serious and even life-threatening complications if the clot travels to the lungs. A DVT can damage the valves in your leg veins so that instead of flowing upward, the blood pools in the lower leg. This is called post-thrombotic syndrome, and it can result in pain, swelling, discoloration, and sores on the leg. CAUSES A DVT is caused by the formation of a blood clot in your leg, pelvis, or arm. Usually, several things contribute to the formation of blood clots. A clot may develop when:  Your blood flow slows down.  Your vein becomes damaged in some way.  You have a condition that makes your blood clot more easily. RISK FACTORS A DVT is more likely to develop in:  People who are older, especially over 71 years of age.  People who are overweight (obese).  People who sit or lie still for a long time, such as during long-distance travel (over 4 hours), bed rest, hospitalization, or during recovery from certain medical conditions like a stroke.  People who do not engage in much physical activity (sedentary lifestyle).  People who have chronic breathing disorders.  People who have a personal or family history of blood clots or blood clotting disease.  People who have peripheral vascular disease (PVD), diabetes, or some types of cancer.  People who have heart disease, especially if the person had a recent heart attack or has congestive heart failure.  People who have neurological  diseases that affect the legs (leg paresis).  People who have had a traumatic injury, such as breaking a hip or leg.  People who have recently had major or lengthy surgery, especially on the hip, knee, or abdomen.  People who have had a central line placed inside a large vein.  People who take medicines that contain the hormone estrogen. These include birth control pills and hormone replacement therapy.  Pregnancy or during childbirth or the postpartum period.  Long plane flights (over 8 hours). SIGNS AND SYMPTOMS Symptoms of a DVT can include:   Swelling of your leg or arm, especially if one side is much worse.  Warmth and redness of your leg or arm, especially if one side is much worse.  Pain in your arm or leg. If the clot is in your leg, symptoms may be more noticeable or worse when you stand or walk.  A feeling of pins and needles, if the clot is in the arm. The symptoms of a DVT that has traveled to the lungs (pulmonary embolism, PE) usually start suddenly and include:  Shortness of breath while active or at rest.  Coughing or coughing up blood or blood-tinged mucus.  Chest pain that is often worse with deep breaths.  Rapid or irregular heartbeat.  Feeling light-headed or dizzy.  Fainting.  Feeling anxious.  Sweating. There may also be pain and swelling in a leg if that is where the blood clot started. These symptoms may represent a serious  problem that is an emergency. Do not wait to see if the symptoms will go away. Get medical help right away. Call your local emergency services (911 in the [place] Do not drive yourself to the hospital. DIAGNOSIS Your health care provider will take a medical history and perform a physical exam. You may also have other tests, including:  Blood tests to assess the clotting properties of your blood.  Imaging tests, such as CT, ultrasound, MRI, X-ray, and other tests to see if you have clots anywhere in your body. TREATMENT After a  DVT is identified, it can be treated. The type of treatment that you receive depends on many factors, such as the cause of your DVT, your risk for bleeding or developing more clots, and other medical conditions that you have. Sometimes, a combination of treatments is necessary. Treatment options may be combined and include:  Monitoring the blood clot with ultrasound.  Taking medicines by mouth, such as newer blood thinners (anticoagulants), thrombolytics, or warfarin.  Taking anticoagulant medicine by injection or through an IV tube.  Wearing compression stockings or using different types ofdevices.  Surgery (rare) to remove the blood clot or to place a filter in your abdomen to stop the blood clot from traveling to your lungs. Treatments for a DVT are often divided into immediate treatment and long-term treatment (up to 3 months after DVT). You can work with your health care provider to choose the treatment program that is best for you. HOME CARE INSTRUCTIONS If you are taking a newer oral anticoagulant:  Take the medicine every single day at the same time each day.  Understand what foods and drugs interact with this medicine.  Understand that there are no regular blood tests required when using this medicine.  Understand the side effects of this medicine, including excessive bruising or bleeding. Ask your health care provider or pharmacist about other possible side effects. If you are taking warfarin:  Understand how to take warfarin and know which foods can affect how warfarin works in Veterinary surgeon.  Understand that it is dangerous to take too much or too little warfarin. Too much warfarin increases the risk of bleeding. Too little warfarin continues to allow the risk for blood clots.  Follow your PT and INR blood testing schedule. The PT and INR results allow your health care provider to adjust your dose of warfarin. It is very important that you have your PT and INR tested as often as  told by your health care provider.  Avoid major changes in your diet, or tell your health care provider before you change your diet. Arrange a visit with a registered dietitian to answer your questions. Many foods, especially foods that are high in vitamin K, can interfere with warfarin and affect the PT and INR results. Eat a consistent amount of foods that are high in vitamin K, such as:  Spinach, kale, broccoli, cabbage, collard greens, turnip greens, Brussels sprouts, peas, cauliflower, seaweed, and parsley.  Beef liver and pork liver.  Green tea.  Soybean oil.  Tell your health care provider about any and all medicines, vitamins, and supplements that you take, including aspirin and other over-the-counter anti-inflammatory medicines. Be especially cautious with aspirin and anti-inflammatory medicines. Do not take those before you ask your health care provider if it is safe to do so. This is important because many medicines can interfere with warfarin and affect the PT and INR results.  Do not start or stop taking any over-the-counter or prescription  medicine unless your health care provider or pharmacist tells you to do so. If you take warfarin, you will also need to do these things:  Hold pressure over cuts for longer than usual.  Tell your dentist and other health care providers that you are taking warfarin before you have any procedures in which bleeding may occur.  Avoid alcohol or drink very small amounts. Tell your health care provider if you change your alcohol intake.  Do not use tobacco products, including cigarettes, chewing tobacco, and e-cigarettes. If you need help quitting, ask your health care provider.  Avoid contact sports. General Instructions  Take over-the-counter and prescription medicines only as told by your health care provider. Anticoagulant medicines can have side effects, including easy bruising and difficulty stopping bleeding. If you are prescribed an  anticoagulant, you will also need to do these things:  Hold pressure over cuts for longer than usual.  Tell your dentist and other health care providers that you are taking anticoagulants before you have any procedures in which bleeding may occur.  Avoid contact sports.  Wear a medical alert bracelet or carry a medical alert card that says you have had a PE.  Ask your health care provider how soon you can go back to your normal activities. Stay active to prevent new blood clots from forming.  Make sure to exercise while traveling or when you have been sitting or standing for a long period of time. It is very important to exercise. Exercise your legs by walking or by tightening and relaxing your leg muscles often. Take frequent walks.  Wear compression stockings as told by your health care provider to help prevent more blood clots from forming.  Do not use tobacco products, including cigarettes, chewing tobacco, and e-cigarettes. If you need help quitting, ask your health care provider.  Keep all follow-up appointments with your health care provider. This is important. PREVENTION Take these actions to decrease your risk of developing another DVT:  Exercise regularly. For at least 30 minutes every day, engage in:  Activity that involves moving your arms and legs.  Activity that encourages good blood flow through your body by increasing your heart rate.  Exercise your arms and legs every hour during long-distance travel (over 4 hours). Drink plenty of water and avoid drinking alcohol while traveling.  Avoid sitting or lying in bed for long periods of time without moving your legs.  Maintain a weight that is appropriate for your height. Ask your health care provider what weight is healthy for you.  If you are a woman who is over 51 years of age, avoid unnecessary use of medicines that contain estrogen. These include birth control pills.  Do not smoke, especially if you take estrogen  medicines. If you need help quitting, ask your health care provider. If you are hospitalized, prevention measures may include:  Early walking after surgery, as soon as your health care provider says that it is safe.  Receiving anticoagulants to prevent blood clots.If you cannot take anticoagulants, other options may be available, such as wearing compression stockings or using different types of devices. SEEK IMMEDIATE MEDICAL CARE IF:  You have new or increased pain, swelling, or redness in an arm or leg.  You have numbness or tingling in an arm or leg.  You have shortness of breath while active or at rest.  You have chest pain.  You have a rapid or irregular heartbeat.  You feel light-headed or dizzy.  You cough up blood.  You notice blood in your vomit, bowel movement, or urine. These symptoms may represent a serious problem that is an emergency. Do not wait to see if the symptoms will go away. Get medical help right away. Call your local emergency services (911 in the [place] Do not drive yourself to the hospital.   This information is not intended to replace advice given to you by your health care provider. Make sure you discuss any questions you have with your health care provider.   Document Released: 04/21/2005 Document Revised: 01/10/2015 Document Reviewed: 08/16/2014 Elsevier Interactive Patient Education [employer].

## 2015-06-09 NOTE — ED Notes (Addendum)
Pt reports seen at [facility] last night and was sent here this morning for ultrasound. Pt had ultrasound and shows a clot in left leg. Pt was given an antibiotic and a shot for blood thinner yesterday, today has rash to chest area.

## 2015-06-09 NOTE — ED Notes (Signed)
Demonstrated injection of lovenox to pt and family. Verbalized understanding. No further questions/concerns at this time.

## 2015-06-09 NOTE — Progress Notes (Signed)
VASCULAR LAB PRELIMINARY  PRELIMINARY  PRELIMINARY  PRELIMINARY  Bilateral lower extremity venous duplex completed.    Preliminary report:  There is acute DVT noted in the left popliteal, peroneal, and posterior tibial veins.  All other veins appear thrombus free.   Mehul Rudin, RVT 06/09/2015, 12:22 PM

## 2015-06-09 NOTE — ED Notes (Signed)
Pt verbalized understanding of d/c instructions, prescriptions, and follow-up care. No further questions/concerns, VSS, assisted to lobby in wheelchair.  

## 2015-06-09 NOTE — ED Provider Notes (Addendum)
CSN: [id number]     Arrival date & time 06/09/15  1228 History   First MD Initiated Contact with Patient 06/09/15 1249     Chief Complaint  Patient presents with  . Leg Pain  . Rash     (Consider location/radiation/quality/duration/timing/severity/associated sxs/prior Treatment) HPI Planes of left leg pain with swelling onset 5 days ago. Seen here yesterday. Received dose of Lovenox and Keflex and outpatient noninvasive vascular study was arranged to check for DVT which came back as positive. She denies other complaint except for mildly pruritic rash right breast which started this morning. No shortness of breath no chest pain no fever. No other associated symptoms and nothing makes pain better or worse. Pain of leg is minimal. Past Medical History  Diagnosis Date  . Hypertension   . Hypothyroidism   . Diabetes mellitus without complication ([place])   . Arthritis     osteoarthritis-hands wrist  . Diverticulosis     showing on CT of abdomen 05-16-15  . Cancer [facility])     pancreatic mass- dx. adenocarcinoma" CT abdomen 05-16-15   Past Surgical History  Procedure Laterality Date  . Joint replacement Right   . Shoulder arthroscopy w/ rotator cuff repair Left   . Shoulder open rotator cuff repair Right   . Tubal ligation    . Appendectomy      open  . Ankle surgery Right   . Eye surgery      macular hole surgery repair  . Cataract extraction Right   . Eus N/A 05/28/2015    Procedure: UPPER ENDOSCOPIC ULTRASOUND (EUS) LINEAR;  Surgeon: Arta Silence, MD;  Location: [facility] ENDOS[facility];  Service: Endoscopy;  Laterality: N/A;   Family History  Problem Relation Age of Onset  . Stroke Mother   . Cancer Sister     lung cancer    Social History  Substance Use Topics  . Smoking status: Former Smoker -- 1.00 packs/day for 30 years    Types: Cigarettes    Quit date: 05/21/1986  . Smokeless tobacco: None  . Alcohol Use: No   OB History    No data available     Review of Systems  HENT:  Negative.   Respiratory: Negative.   Cardiovascular: Negative.   Gastrointestinal: Negative.   Musculoskeletal: Positive for myalgias.  Skin: Positive for rash.  Allergic/Immunologic: Positive for immunocompromised state.  Neurological: Positive for weakness.       Generalized weakness since having received chemotherapy earlier this week,  Psychiatric/Behavioral: Negative.   All other systems reviewed and are negative.     Allergies  Aspirin  Home Medications   Prior to Admission medications   Medication Sig Start Date End Date Taking? Authorizing Provider  cephALEXin (KEFLEX) 250 MG capsule Take 1 capsule (250 mg total) by mouth 4 (four) times daily. 06/08/15   Blanchie Dessert, MD  levothyroxine (SYNTHROID, LEVOTHROID) 100 MCG tablet Take 100 mcg by mouth daily before breakfast.  05/14/15   Historical Provider, MD  lisinopril (PRINIVIL,ZESTRIL) 20 MG tablet Take 20 mg by mouth daily.  05/14/15   Historical Provider, MD  metFORMIN (GLUCOPHAGE) 1000 MG tablet Take 1,000 mg by mouth 2 (two) times daily with a meal.  03/12/15   Historical Provider, MD  omeprazole (PRILOSEC OTC) 20 MG tablet Take 20 mg by mouth daily as needed (acid reflux).    Historical Provider, MD  ondansetron (ZOFRAN) 8 MG tablet Take 1 tablet (8 mg total) by mouth 2 (two) times daily as needed (Nausea or vomiting).  05/31/15   [name], MD  traMADol (ULTRAM) 50 MG tablet Take 1 tablet (50 mg total) by mouth every 6 (six) hours as needed. Patient taking differently: Take 25-50 mg by mouth every 6 (six) hours as needed for moderate pain.  05/31/15   [name], MD   BP 100/61 mmHg  Pulse 100  Temp(Src) 97.7 F (36.5 C)  Resp 18  Ht 5' 6.5" (1.689 m)  Wt 144 lb (65.318 kg)  BMI 22.90 kg/m2  SpO2 95% Physical Exam  Constitutional: She appears well-developed and well-nourished.  HENT:  Head: Normocephalic and atraumatic.  Eyes: Conjunctivae are normal. Pupils are equal, round, and reactive to light.  Neck: Neck supple.  No tracheal deviation present. No thyromegaly present.  Cardiovascular: Normal rate and regular rhythm.   No murmur heard. Pulmonary/Chest: Effort normal and breath sounds normal.  Abdominal: Soft. Bowel sounds are normal. She exhibits no distension. There is no tenderness.  Musculoskeletal: Normal range of motion. She exhibits no edema or tenderness.  Left Lower extremity mildly swollen with pinkish rash at distal leg. No tenderness. DP pulse 2+. Good capillary refill  Neurological: She is alert. Coordination normal.  Skin: Skin is warm and dry. No rash noted.  Pinkish rash, barely visible at right breast  Psychiatric: She has a normal mood and affect.  Nursing note and vitals reviewed.   ED Course  Procedures (including critical care time) Labs Review Labs Reviewed - No data to display  Imaging Review No results found. I have personally reviewed and evaluated these images and lab results as part of my medical decision-making.   EKG Interpretation None      MDM  Case discussed with Dr.Ennever and with hospital pharmacist. We will prescribe Lovenox 65 mg every 12 hours . She can take Benadryl for itch as needed. 25 mg every 6 hours She is to follow-up with Dr.Feng next week . She can stop Keflex Final diagnoses:  None   Diagnosis #1 DVT left lower leg #2 nonspecific rash     Orlie Dakin, MD 06/09/15 1344  Orlie Dakin, MD 06/09/15 1348

## 2015-06-12 ENCOUNTER — Other Ambulatory Visit (HOSPITAL_BASED_OUTPATIENT_CLINIC_OR_DEPARTMENT_OTHER): Payer: Medicare Other

## 2015-06-12 ENCOUNTER — Telehealth: Payer: Self-pay | Admitting: *Deleted

## 2015-06-12 ENCOUNTER — Encounter: Payer: Self-pay | Admitting: Hematology

## 2015-06-12 ENCOUNTER — Ambulatory Visit (HOSPITAL_BASED_OUTPATIENT_CLINIC_OR_DEPARTMENT_OTHER): Payer: Medicare Other

## 2015-06-12 ENCOUNTER — Ambulatory Visit (HOSPITAL_BASED_OUTPATIENT_CLINIC_OR_DEPARTMENT_OTHER): Payer: Medicare Other | Admitting: Hematology

## 2015-06-12 VITALS — BP 127/70 | HR 83 | Temp 98.3°F | Resp 18 | Ht 66.5 in | Wt 147.0 lb

## 2015-06-12 DIAGNOSIS — C251 Malignant neoplasm of body of pancreas: Secondary | ICD-10-CM

## 2015-06-12 DIAGNOSIS — I82402 Acute embolism and thrombosis of unspecified deep veins of left lower extremity: Secondary | ICD-10-CM

## 2015-06-12 DIAGNOSIS — E039 Hypothyroidism, unspecified: Secondary | ICD-10-CM

## 2015-06-12 DIAGNOSIS — Z5111 Encounter for antineoplastic chemotherapy: Secondary | ICD-10-CM | POA: Diagnosis not present

## 2015-06-12 LAB — CBC WITH DIFFERENTIAL/PLATELET
BASO%: 0.6 % (ref 0.0–2.0)
Basophils Absolute: 0 10*3/uL (ref 0.0–0.1)
EOS ABS: 0 10*3/uL (ref 0.0–0.5)
EOS%: 0.9 % (ref 0.0–7.0)
HEMATOCRIT: 32.8 % — AB (ref 34.8–46.6)
HGB: 10.5 g/dL — ABNORMAL LOW (ref 11.6–15.9)
LYMPH#: 1.5 10*3/uL (ref 0.9–3.3)
LYMPH%: 46.8 % (ref 14.0–49.7)
MCH: 29 pg (ref 25.1–34.0)
MCHC: 31.9 g/dL (ref 31.5–36.0)
MCV: 90.8 fL (ref 79.5–101.0)
MONO#: 0.3 10*3/uL (ref 0.1–0.9)
MONO%: 10.6 % (ref 0.0–14.0)
NEUT%: 41.1 % (ref 38.4–76.8)
NEUTROS ABS: 1.3 10*3/uL — AB (ref 1.5–6.5)
PLATELETS: 180 10*3/uL (ref 145–400)
RBC: 3.62 10*6/uL — ABNORMAL LOW (ref 3.70–5.45)
RDW: 14.3 % (ref 11.2–14.5)
WBC: 3.3 10*3/uL — AB (ref 3.9–10.3)

## 2015-06-12 LAB — COMPREHENSIVE METABOLIC PANEL
ALK PHOS: 61 U/L (ref 40–150)
ALT: 15 U/L (ref 0–55)
ANION GAP: 15 meq/L — AB (ref 3–11)
AST: 20 U/L (ref 5–34)
Albumin: 3.2 g/dL — ABNORMAL LOW (ref 3.5–5.0)
BUN: 16.6 mg/dL (ref 7.0–26.0)
CALCIUM: 10.3 mg/dL (ref 8.4–10.4)
CO2: 22 mEq/L (ref 22–29)
CREATININE: 0.8 mg/dL (ref 0.6–1.1)
Chloride: 103 mEq/L (ref 98–109)
EGFR: 66 mL/min/{1.73_m2} — ABNORMAL LOW (ref >90)
Glucose: 183 mg/dl — ABNORMAL HIGH (ref 70–140)
Potassium: 3.8 mEq/L (ref 3.5–5.1)
Sodium: 140 mEq/L (ref 136–145)
TOTAL PROTEIN: 7.2 g/dL (ref 6.4–8.3)

## 2015-06-12 MED ORDER — PROCHLORPERAZINE MALEATE 10 MG PO TABS
ORAL_TABLET | ORAL | Status: AC
  Filled 2015-06-12: qty 1

## 2015-06-12 MED ORDER — PROCHLORPERAZINE MALEATE 10 MG PO TABS
10.0000 mg | ORAL_TABLET | Freq: Once | ORAL | Status: AC
  Administered 2015-06-12: 10 mg via ORAL

## 2015-06-12 MED ORDER — SODIUM CHLORIDE 0.9 % IV SOLN
850.0000 mg/m2 | Freq: Once | INTRAVENOUS | Status: AC
  Administered 2015-06-12: 1520 mg via INTRAVENOUS
  Filled 2015-06-12: qty 39.98

## 2015-06-12 MED ORDER — SODIUM CHLORIDE 0.9 % IV SOLN
Freq: Once | INTRAVENOUS | Status: AC
  Administered 2015-06-12: 14:00:00 via INTRAVENOUS

## 2015-06-12 MED ORDER — WARFARIN SODIUM 5 MG PO TABS: 5.0000 mg | ORAL_TABLET | Freq: Every day | ORAL | Status: DC

## 2015-06-12 MED ORDER — HYDROCORTISONE 2.5 % EX LOTN: TOPICAL_LOTION | Freq: Two times a day (BID) | CUTANEOUS | Status: DC

## 2015-06-12 NOTE — Telephone Encounter (Signed)
Per staff message and POF I have scheduled appts. Advised scheduler of appts and no available on 2/10. JMW

## 2015-06-12 NOTE — Progress Notes (Signed)
OK to treat with ANC-1.3 per Dr. Feng 

## 2015-06-12 NOTE — Progress Notes (Signed)
OK to treat despite low ANC with reduced dose per Dr Burr Medico.

## 2015-06-12 NOTE — Progress Notes (Signed)
[place]  [phone] [phone]  Clinic Follow Up Note   Patient Care Team: Hulan Fess, MD as PCP - General (Family Medicine) [name], MD as Consulting Physician (Hematology) Arta Silence, MD as Consulting Physician (Gastroenterology) 06/12/2015  CHIEF COMPLAINTS:  Follow up unresectable pancreatic adenocarcinoma    OTHER RELATED ISSUES 1. Left LE DVT diagnosed on 06/09/2015, started on lovenox and bridged to coumadin   Oncology History   Pancreatic cancer [facility])   Staging form: Pancreas, AJCC 7th Edition     Clinical stage from 05/27/2014: Stage IIB (T2, N1, M0) - Signed by [name], MD on 05/31/2015       Pancreatic cancer ([place])   05/16/2015 Imaging CT chest, abdomen and pelvis with contrast showed a bulky mass at pancreatic neck/body, tumor encases the celiac and proximal branches and a cruise the main portal vein, peripancreatic adenopathy. no distant mets.    05/28/2015 Initial Biopsy Pancreatic mass needle biopsy showed well differentiated adenocarcinoma   05/31/2015 Initial Diagnosis Pancreatic cancer ([place])   05/31/2015 Tumor Marker CA19.9 =375   06/05/2015 -  Chemotherapy weekly gemcitabine 1000mg /m2, dose reduced to 850mg /m2 on week 2 due to tolerance issue    HISTORY OF PRESENTING ILLNESS:  Nicole Sherman 82 y.o. female is here because of her recent abnormal CT scan, which showed a pink vaginal mass, highly suspicious for pancreatic cancer.  She has been haivng epigastric pain after meal for 4-5 months. She also reports left side pain at night when she sleeps on left side, mild back pain, the pain is worse lately, lasts longer especially afte rdinner, it about 5/10, appetite is lower than before, she lost aobut 10-20lbs in the past 5 months. No other complains, she had loose BM 1-2 time BM for 8-10 months, no hematochezia or melena. She was evaluated by her primary care physician Dr. Rex Kras, abdominal ultrasound was negative on  05/04/2015. She underwent a CT abdomen and pelvis with contrast on 05/16/2015, which showed a 4 x 1 cm mass in pancreatic body and a neck, tumor encases the distal celiac and proximal branches and occludes the main portal vein. No distant metastasis on the CT scan. She was referred to [place] for further workup.  She has good energy level, she is able to do all house work. She has intermittent dizziness from blood pressure meds. No other complains.   CURRENT THERAPY: weekly gemcitabine 1000 mg/m, dose reduced to 850 mg/m on week 2 due to tolerance issue  INTERIM HISTORY: Sure returns for follow-up. She is accompanied her husband and 2 daughters. She started chemo last week, and had profound fatigue, moderate nausea, low appetite and a few episodes of vomiting and diarrhea for about 2 days. No fever or chills. She noticed left lower extremity is swollen around calf, and went to emergency room on 2/4, was given Keflex for presumed cellulitis, Doppler was not able to be done during the ED visit, and she went back next day and Doppler did show left lower extremity DVT. She was started on Lovenox injection twice daily. She has recovered well this week, appetite is better, eating much more, and the level also improved. The left lower extremity swelling and skin redness has nearly resolved.  MEDICAL HISTORY:  Past Medical History  Diagnosis Date  . Hypertension   . Hypothyroidism   . Diabetes mellitus without complication ([place])   . Arthritis     osteoarthritis-hands wrist  . Diverticulosis     showing on CT  of abdomen 05-16-15  . Cancer [facility])     pancreatic mass- dx. adenocarcinoma" CT abdomen 05-16-15    SURGICAL HISTORY: Past Surgical History  Procedure Laterality Date  . Joint replacement Right   . Shoulder arthroscopy w/ rotator cuff repair Left   . Shoulder open rotator cuff repair Right   . Tubal ligation    . Appendectomy      open  . Ankle surgery Right   . Eye surgery      macular hole  surgery repair  . Cataract extraction Right   . Eus N/A 05/28/2015    Procedure: UPPER ENDOSCOPIC ULTRASOUND (EUS) LINEAR;  Surgeon: Arta Silence, MD;  Location: [facility];  Service: Endoscopy;  Laterality: N/A;    SOCIAL HISTORY: Social History   Social History  . Marital Status: Married    Spouse Name: N/A  . Number of Children: 2 daughters   . Years of Education: N/A   Occupational History  . Not on file.   Social History Main Topics  . Smoking status: Former Smoker -- 1.00 packs/day for 30 years    Types: Cigarettes    Quit date: 05/21/1986  . Smokeless tobacco: Not on file  . Alcohol Use: No  . Drug Use: No  . Sexual Activity: Not on file   Other Topics Concern  . Not on file   Social History Narrative    FAMILY HISTORY: Family History  Problem Relation Age of Onset  . Stroke Mother   . Cancer Sister     lung cancer     ALLERGIES:  is allergic to aspirin.  MEDICATIONS:  Current Outpatient Prescriptions  Medication Sig Dispense Refill  . enoxaparin (LOVENOX) 100 MG/ML injection Inject 0.65 mLs (65 mg total) into the skin every 12 (twelve) hours. 20 mL 0  . levothyroxine (SYNTHROID, LEVOTHROID) 100 MCG tablet Take 100 mcg by mouth daily before breakfast.     . lisinopril (PRINIVIL,ZESTRIL) 20 MG tablet Take 20 mg by mouth daily.     . metFORMIN (GLUCOPHAGE) 1000 MG tablet Take 1,000 mg by mouth 2 (two) times daily with a meal.     . ondansetron (ZOFRAN) 8 MG tablet Take 1 tablet (8 mg total) by mouth 2 (two) times daily as needed (Nausea or vomiting). 30 tablet 1  . traMADol (ULTRAM) 50 MG tablet Take 1 tablet (50 mg total) by mouth every 6 (six) hours as needed. (Patient taking differently: Take 25-50 mg by mouth every 6 (six) hours as needed for moderate pain. ) 30 tablet 0  . hydrocortisone 2.5 % lotion Apply topically 2 (two) times daily. 59 mL 0  . omeprazole (PRILOSEC OTC) 20 MG tablet Take 20 mg by mouth daily as needed (acid reflux). Reported on  06/12/2015    . warfarin (COUMADIN) 5 MG tablet Take 1 tablet (5 mg total) by mouth daily. 30 tablet 0   No current facility-administered medications for this visit.    REVIEW OF SYSTEMS:   Constitutional: Denies fevers, chills or abnormal night sweats Eyes: Denies blurriness of vision, double vision or watery eyes Ears, nose, mouth, throat, and face: Denies mucositis or sore throat Respiratory: Denies cough, dyspnea or wheezes Cardiovascular: Denies palpitation, chest discomfort or lower extremity swelling Gastrointestinal:  Denies nausea, heartburn or change in bowel habits Skin: Denies abnormal skin rashes Lymphatics: Denies new lymphadenopathy or easy bruising Neurological:Denies numbness, tingling or new weaknesses Behavioral/Psych: Mood is stable, no new changes  All other systems were reviewed with the patient and  are negative.  PHYSICAL EXAMINATION: ECOG PERFORMANCE STATUS: 1-2  Filed Vitals:   06/12/15 1303  BP: 127/70  Pulse: 83  Temp: 98.3 F (36.8 C)  Resp: 18   Filed Weights   06/12/15 1303  Weight: 147 lb (66.679 kg)    GENERAL:alert, no distress and comfortable SKIN: skin color, texture, turgor are normal, no rashes or significant lesions EYES: normal, conjunctiva are pink and non-injected, sclera clear OROPHARYNX:no exudate, no erythema and lips, buccal mucosa, and tongue normal  NECK: supple, thyroid normal size, non-tender, without nodularity LYMPH:  no palpable lymphadenopathy in the cervical, axillary or inguinal LUNGS: clear to auscultation and percussion with normal breathing effort HEART: regular rate & rhythm and no murmurs and no lower extremity edema ABDOMEN:abdomen soft, non-tender and normal bowel sounds Musculoskeletal:no cyanosis of digits and no clubbing  PSYCH: alert & oriented x 3 with fluent speech NEURO: no focal motor/sensory deficits  LABORATORY DATA:  I have reviewed the data as listed  CBC Latest Ref Rng 06/12/2015 06/08/2015  06/08/2015  WBC 3.9 - 10.3 10e3/uL 3.3(L) - 6.8  Hemoglobin 11.6 - 15.9 g/dL 10.5(L) 9.9(L) 9.4(L)  Hematocrit 34.8 - 46.6 % 32.8(L) 29.0(L) 29.3(L)  Platelets 145 - 400 10e3/uL 180 - 153   CMP Latest Ref Rng 06/12/2015 06/08/2015 06/05/2015  Glucose 70 - 140 mg/dl 183(H) 187(H) 169(H)  BUN 7.0 - 26.0 mg/dL 16.6 18 12.7  Creatinine 0.6 - 1.1 mg/dL 0.8 0.70 0.8  Sodium 136 - 145 mEq/L 140 133(L) 140  Potassium 3.5 - 5.1 mEq/L 3.8 4.0 4.1  Chloride 101 - 111 mmol/L - 97(L) -  CO2 22 - 29 mEq/L 22 - 20(L)  Calcium 8.4 - 10.4 mg/dL 10.3 - 9.8  Total Protein 6.4 - 8.3 g/dL 7.2 - 7.0  Total Bilirubin 0.20 - 1.20 mg/dL <0.30 - <0.30  Alkaline Phos 40 - 150 U/L 61 - 53  AST 5 - 34 U/L 20 - 13  ALT 0 - 55 U/L 15 - 14     PATHOLOGY REPORT  Diagnosis 05/28/2015 FINE NEEDLE ASPIRATION: NEEDLE ASPIRATION, PANCREAS BODY (SPECIMEN 1 OF 1 COLLECTED 05/28/15): WELL DIFFERENTIATED ADENOCARCINOMA. Preliminary Diagnosis Intraoperative Diagnosis: Adequate (JDP)   RADIOGRAPHIC STUDIES: I have personally reviewed the radiological images as listed and agreed with the findings in the report. Ct Chest Wo Contrast  05/30/2015  CLINICAL DATA:  Pancreatic cancer. Weight loss. Evaluate for metastatic disease. EXAM: CT CHEST WITHOUT CONTRAST TECHNIQUE: Multidetector CT imaging of the chest was performed following the standard protocol without IV contrast. COMPARISON:  None. FINDINGS: Mediastinum/Nodes: No chest wall mass, breast masses, supraclavicular or axillary lymphadenopathy. The heart is normal in size. No pericardial effusion. There is tortuosity, ectasia and calcification of the thoracic aorta. No focal aneurysm. Three-vessel coronary artery calcifications are noted. No mediastinal or hilar mass or adenopathy. The esophagus is grossly normal. Lungs/Pleura: Emphysematous changes are noted. Some breathing motion artifact but no obvious pulmonary nodules to suggest pulmonary metastatic disease. No worrisome pulmonary  lesions or acute pulmonary findings. No pleural effusion or pleural thickening. Upper abdomen: Pancreatic body mass is again demonstrated. Advanced atherosclerotic calcifications involving the aorta. Musculoskeletal: Moderate degenerative changes involving the thoracic spine. No acute bony abnormality or findings for metastatic disease. Surgical changes involving the right shoulder. IMPRESSION: 1. No CT findings for metastatic disease involving the chest. 2. Mild emphysematous changes. 3. Advanced atherosclerotic calcifications involving the thoracic and abdominal aorta. Electronically Signed   By: Marijo Sanes M.D.   On: 05/30/2015 08:28  Ct Abdomen Pelvis W Contrast  05/16/2015  CLINICAL DATA:  Generalized abdominal pain for 4 months. EXAM: CT ABDOMEN AND PELVIS WITH CONTRAST TECHNIQUE: Multidetector CT imaging of the abdomen and pelvis was performed using the standard protocol following bolus administration of intravenous contrast. Creatinine was obtained on site at [place] at [address] Results: Creatinine 0.6 mg/dL. CONTRAST:  110mL ISOVUE-300 IOPAMIDOL (ISOVUE-300) INJECTION 61% COMPARISON:  None. FINDINGS: Lower chest and abdominal wall:  No contributory findings. Hepatobiliary: No evidence of metastasis. Ill-defined hypervascularity in segment 5/6 on series 2, image 28 is likely a perfusion anomaly. Benign cyst in a lower right liver.No evidence of biliary obstruction or stone. Dilated biliary tree and gallbladder above the pancreatic head mass. Pancreas: There is a bulky hypo enhancing mass in the midline pancreatic body and neck measuring up to 41 mm. Soft tissue encases the distal celiac axis and its proximal branches. Occluded portal vein at the pancreatic head with reconstitution from channels. Small nonocclusive thrombus present beyond the occlusion. Marked proximal duct dilatation with atrophy. Enlarged peripancreatic lymph nodes up to 11 mm short axis superior to the main  portal vein. No evidence of peritoneal tumor. Spleen: Unremarkable. Adrenals/Urinary Tract: Negative adrenals. No hydronephrosis or stone. Unremarkable bladder. Reproductive:No pathologic findings. Stomach/Bowel:  Extensive colonic diverticulosis. Vascular/Lymphatic: Tumor related findings described above. There are varices in the proximal stomach. No mass or adenopathy. Peritoneal: No ascites or pneumoperitoneum. Musculoskeletal: No acute abnormalities. These results will be called to the ordering clinician or representative by the Radiologist Assistant, and communication documented in the PACS or zVision Dashboard. IMPRESSION: 1. Bulky pancreatic mass consistent with adenocarcinoma. Tumor encases the distal celiac and its proximal branches and occludes the main portal vein. Peripancreatic lymphadenopathy. 2. Biliary obstruction. 3. Proximal gastric varices. Electronically Signed   By: Monte Fantasia M.D.   On: 05/16/2015 13:22    ASSESSMENT & PLAN:  82 year old Caucasian female, presented with intermittent abdominal pain, weight loss, and a CT finding of a pancreatic mass.  1. Pancreatic body/head adenocarcinoma, well differentiated, cT2N1M0, stage IIB, unresectable -I reviewed her CT chest results, which was negative for metastatic disease. -The biopsy results was reviewed with her and her family members in detail. -Her case was discussed in our GI tumor Board yesterday, Dr. Barry Dienes feels this is not resectable disease. -I reviewed the nature history of pancreatic cancer, which is progressive. Her disease is incurable at this stage, and the goal of therapy is palliative and prolong her life. -I recommend palliative systemic chemotherapy -Given her advanced age, she may not be a good candidate for intensive chemotherapy such as FOLFIRINOX. However she does have very good performance status, and preserved organ function, single agent gemcitabine would be reasonable. If she tolerates well, we may try to  add Abraxane.  -If she develops worsening pain, we may also consider palliative radiation or celiac nerve block.  -She is started weekly gemcitabine last week, had a moderate side effects, and a newly developed left lower extremity DVT, I'll reduce her dose to 800 mg/m. Lab results reviewed with her today, adequate for treatment, we'll proceed with cycle 2 today.  2. Left LE DVT -Probably provoked by her underlying malignancy and chemotherapy -I recommend anticoagulation indefinitely, giving her incurable malignancy, if no contraindications such as bleeding, occasions. -She has very high co-pay for Lovenox, I recommend her to switch to Coumadin. Benefit and side effects of Coumadin, especially food restriction and frequent blood monitoring, was discussed with her and her family members.  She agrees to proceed -I called in Coumadin 5 mg daily for her, she will continue Lovenox injection for at least 4-5 days, continue INR therapeutic.  3. Fatigue, nausea and diarrhea -secondary to chemo, she has recovered well -We reviewed the management of nausea and diarrhea with her and her family members again. -I encouraged her to have nutritional supplement, and be physically as active as she can   4.HTN, DM, hypothyroidism  -follow up with PCP  -We will monitor her blood pressure and blood glucose closely during her therapy, which may be affected by dehydration, and steroids as pre-meds.   Plan -Lab reviewed, adequate for treatment, we'll proceed to week 2 gemcitabine today. Dose reduced to 100 mg/m due to tolerance issues -Start Coumadin 5 mg daily today and continue Lovenox injection, check PT/INR on Friday and next Tuesday -IVF on Friday 2/10 and I'll see her back in 1 week before week 3 chemotherapy  All questions were answered. The patient knows to call the clinic with any problems, questions or concerns. I spent 30 minutes counseling the patient face to face. The total time spent in the  appointment was 40 minutes and more than 50% was on counseling.    [name], MD 06/12/2015

## 2015-06-12 NOTE — Patient Instructions (Signed)
[facility] Discharge Instructions for Patients Receiving Chemotherapy  Today you received the following chemotherapy agents Gemzar  To help prevent nausea and vomiting after your treatment, we encourage you to take your nausea medication as directed.    If you develop nausea and vomiting that is not controlled by your nausea medication, call the clinic.   BELOW ARE SYMPTOMS THAT SHOULD BE REPORTED IMMEDIATELY:  *FEVER GREATER THAN 100.5 F  *CHILLS WITH OR WITHOUT FEVER  NAUSEA AND VOMITING THAT IS NOT CONTROLLED WITH YOUR NAUSEA MEDICATION  *UNUSUAL SHORTNESS OF BREATH  *UNUSUAL BRUISING OR BLEEDING  TENDERNESS IN MOUTH AND THROAT WITH OR WITHOUT PRESENCE OF ULCERS  *URINARY PROBLEMS  *BOWEL PROBLEMS  UNUSUAL RASH Items with * indicate a potential emergency and should be followed up as soon as possible.  Feel free to call the clinic you have any questions or concerns. The clinic phone number is [phone].  Please show the CHEMO ALERT CARD at check-in to the Emergency Department and triage nurse.   

## 2015-06-14 ENCOUNTER — Telehealth: Payer: Self-pay

## 2015-06-14 ENCOUNTER — Ambulatory Visit (HOSPITAL_BASED_OUTPATIENT_CLINIC_OR_DEPARTMENT_OTHER): Payer: Medicare Other

## 2015-06-14 ENCOUNTER — Ambulatory Visit (HOSPITAL_BASED_OUTPATIENT_CLINIC_OR_DEPARTMENT_OTHER): Payer: Medicare Other | Admitting: Nurse Practitioner

## 2015-06-14 ENCOUNTER — Telehealth: Payer: Self-pay | Admitting: Nurse Practitioner

## 2015-06-14 VITALS — BP 118/57 | HR 94 | Temp 98.9°F | Resp 17 | Ht 66.5 in | Wt 150.4 lb

## 2015-06-14 DIAGNOSIS — Z7901 Long term (current) use of anticoagulants: Secondary | ICD-10-CM | POA: Diagnosis not present

## 2015-06-14 DIAGNOSIS — R21 Rash and other nonspecific skin eruption: Secondary | ICD-10-CM

## 2015-06-14 DIAGNOSIS — C250 Malignant neoplasm of head of pancreas: Secondary | ICD-10-CM

## 2015-06-14 DIAGNOSIS — C259 Malignant neoplasm of pancreas, unspecified: Secondary | ICD-10-CM | POA: Diagnosis not present

## 2015-06-14 LAB — PROTIME-INR
INR: 1.5 — ABNORMAL LOW (ref 2.00–3.50)
PROTIME: 18 s — AB (ref 10.6–13.4)

## 2015-06-14 MED ORDER — METHYLPREDNISOLONE 4 MG PO TBPK: ORAL_TABLET | ORAL | Status: DC

## 2015-06-14 NOTE — Telephone Encounter (Signed)
Pt reports she was in on 06/12/15 to see MD and chemo.  Pt reports she has had a rash for approximately 2 weeks, and that MD had ordered a creme for her.  Chart reviewed - hydrocortisone 2.5% ordered.  Pt reports that rash continues to spread and that it is "driving her crazy.  I could claw my skin off".  Pt reports she has been taking benadryl 25 mg TID, and [employer] lotion.  Pt reports rash has progressed to cover her anterior torso.  Pt wondering if rash could be lovenox or coumadin.  On questioning, it was determined rash started prior to lovenox and coumadin.  Pt confirms rash started after 1st chemo.  Last chemo 06/12/15 - gemzar.  Pt requesting instructions on what can be done for the rash.  Writer advised pt Dr. Burr Medico would be notified and she would receive a return call.  Routed to Pod 1 for further action.

## 2015-06-14 NOTE — Telephone Encounter (Signed)
Pt daughter calling - pt is reporting 10/10 itch factor on the rash.  Asking if there is anything else that can be done for it.  Lynnda Shields can see pt.  Notified daughter and let her know pt needs to be here within 30-45 min to be seen.  She voiced understanding.  Routed to [employer].  POF sent

## 2015-06-14 NOTE — Telephone Encounter (Signed)
Second call about rash, small raised bumps started to arms and chest now spreading to stomach.  No pain just severe itching and burning.  scratching and may break skin."

## 2015-06-14 NOTE — Telephone Encounter (Signed)
per pof to sch pt appt-per pof pt aware-on the way

## 2015-06-15 ENCOUNTER — Telehealth: Payer: Self-pay

## 2015-06-15 NOTE — Telephone Encounter (Signed)
Called to follow up with patient, rash is still bad per patient. Informed patient since starting the steroid this morning  It will take some time to kick in. Patient informed to try hydrocortisone cream in the mean time. pateint verbalized understanding.

## 2015-06-17 ENCOUNTER — Other Ambulatory Visit: Payer: Self-pay | Admitting: Hematology

## 2015-06-17 ENCOUNTER — Other Ambulatory Visit: Payer: Self-pay | Admitting: Nurse Practitioner

## 2015-06-17 ENCOUNTER — Encounter: Payer: Self-pay | Admitting: Nurse Practitioner

## 2015-06-17 DIAGNOSIS — R21 Rash and other nonspecific skin eruption: Secondary | ICD-10-CM | POA: Insufficient documentation

## 2015-06-17 DIAGNOSIS — Z7901 Long term (current) use of anticoagulants: Secondary | ICD-10-CM | POA: Insufficient documentation

## 2015-06-17 NOTE — Assessment & Plan Note (Signed)
Patient received cycle 1, day 1 of her gemcitabine only chemotherapy on 06/05/2015.  She received cycle 1, day 8 of the same chemotherapy on 06/12/2015.  She will need to be scheduled for labs, recheck with this symptom management clinic, and her next cycle of chemotherapy on 06/19/2015.

## 2015-06-17 NOTE — Assessment & Plan Note (Addendum)
Patient was evaluated in the emergency department on Friday evening, 06/08/2015 for left lower extremity erythema and edema.  There was questionable that time, patient was suffering from a DVT or cellulitis; so patient was initiated with Lovenox and Keflex antibiotics.  She returned to the hospital on Saturday, 06/09/2015 to undergo a Doppler ultrasound.  Ultrasound did reveal a positive left lower extremity DVT.  Patient states that she had taken only to Keflex antibiotic tablets, total prior to discontinuing-after she was afforded that she did have a DVT.  She has continued with Lovenox injections twice daily.  She also begin Coumadin 5 mg daily.  On Saturday, 06/09/2015 as well.  Patient began developing a pruritic rash to her abdomen and her thighs.  On Saturday afternoon.  Patient has been taking Benadryl with only minimal effectiveness.  She's also tried some hydrocortisone cream to the rash.  Exam today reveals some scattered pink/red rash to lower abdomen and thighs.  Also, patient has only trace erythema to left lower extremity.  There is no warmth, and only minimal edema.  There is no calf tenderness.  Also, there was some mild erythema to bilateral periorbital areas.  Patient denied any throat swelling or scratchiness.  Reviewed all findings with Dr. Burr Medico; and agreed that the differential diagnosis for rash could be either the Keflex antibiotic for the Lovenox.  Since it would be inappropriate to discontinue the Lovenox until the INR is therapeutic.-Decision was made to treat rash with Benadryl/Pepcid, and Medrol Dosepak.  Patient was advised to start the dose pack in the morning since it may make her restless at night.  Will Place Keflex antibiotics on patient's allergy list.  Patient was advised to go directly to the emergency department for any worsening allergy symptoms whatsoever.

## 2015-06-17 NOTE — Assessment & Plan Note (Signed)
Patient has been diagnosed with a left lower extremity DVT.  She was initiated with Lovenox and Coumadin over the weekend.  INR on recheck today was 1.5.  Since patient is still not therapeutic with her Coumadin; patient will need to continue her Lovenox injections.  The plan is to recheck her labs on 06/19/2015.

## 2015-06-17 NOTE — Progress Notes (Signed)
SYMPTOM MANAGEMENT CLINIC   HPI: Nicole Sherman 82 y.o. female diagnosed with pancreatic cancer.  Currently undergoing gemcitabine chemotherapy regimen.   Patient was evaluated in the emergency department on Friday evening, 06/08/2015 for left lower extremity erythema and edema.  There was questionable that time, patient was suffering from a DVT or cellulitis; so patient was initiated with Lovenox and Keflex antibiotics.  She returned to the hospital on Saturday, 06/09/2015 to undergo a Doppler ultrasound.  Ultrasound did reveal a positive left lower extremity DVT.  Patient states that she had taken only to Keflex antibiotic tablets, total prior to discontinuing-after she was afforded that she did have a DVT.  She has continued with Lovenox injections twice daily.  She also begin Coumadin 5 mg daily.  On Saturday, 06/09/2015 as well.  Patient began developing a pruritic rash to her abdomen and her thighs.  On Saturday afternoon.  Patient has been taking Benadryl with only minimal effectiveness.  She's also tried some hydrocortisone cream to the rash.  Exam today reveals some scattered pink/red rash to lower abdomen and thighs.  Also, patient has only trace erythema to left lower extremity.  There is no warmth, and only minimal edema.  There is no calf tenderness.  Also, there was some mild erythema to bilateral periorbital areas.  Patient denied any throat swelling or scratchiness.  Reviewed all findings with Dr. Burr Medico; and agreed that the differential diagnosis for rash could be either the Keflex antibiotic for the Lovenox.  Since it would be inappropriate to discontinue the Lovenox until the INR is therapeutic.-Decision was made to treat rash with Benadryl/Pepcid, and Medrol Dosepak.  Patient was advised to start the dose pack in the morning since it may make her restless at night.  Will place Keflex antibiotics on patient's allergy list.  Patient was advised to go directly to the  emergency department for any worsening allergy symptoms whatsoever.  HPI  Review of Systems  Skin: Positive for itching and rash.  All other systems reviewed and are negative.   Past Medical History  Diagnosis Date  . Hypertension   . Hypothyroidism   . Diabetes mellitus without complication ([place])   . Arthritis     osteoarthritis-hands wrist  . Diverticulosis     showing on CT of abdomen 05-16-15  . Cancer [facility])     pancreatic mass- dx. adenocarcinoma" CT abdomen 05-16-15    Past Surgical History  Procedure Laterality Date  . Joint replacement Right   . Shoulder arthroscopy w/ rotator cuff repair Left   . Shoulder open rotator cuff repair Right   . Tubal ligation    . Appendectomy      open  . Ankle surgery Right   . Eye surgery      macular hole surgery repair  . Cataract extraction Right   . Eus N/A 05/28/2015    Procedure: UPPER ENDOSCOPIC ULTRASOUND (EUS) LINEAR;  Surgeon: Arta Silence, MD;  Location: [facility] ENDOSCOPY;  Service: Endoscopy;  Laterality: N/A;    has Pancreatic cancer ([place]); Rash; and Long term current use of anticoagulant therapy on her problem list.    is allergic to aspirin.    Medication List       This list is accurate as of: 06/14/15 11:59 PM.  Always use your most recent med list.               enoxaparin 100 MG/ML injection  Commonly known as:  LOVENOX  Inject 0.65 mLs (65 mg  total) into the skin every 12 (twelve) hours.     hydrocortisone 2.5 % lotion  Apply topically 2 (two) times daily.     levothyroxine 100 MCG tablet  Commonly known as:  SYNTHROID, LEVOTHROID  Take 100 mcg by mouth daily before breakfast.     lisinopril 20 MG tablet  Commonly known as:  PRINIVIL,ZESTRIL  Take 20 mg by mouth daily.     metFORMIN 1000 MG tablet  Commonly known as:  GLUCOPHAGE  Take 1,000 mg by mouth 2 (two) times daily with a meal.     methylPREDNISolone 4 MG Tbpk tablet  Commonly known as:  MEDROL DOSEPAK  Medrol dose pak.  Take as  directed.     omeprazole 20 MG tablet  Commonly known as:  PRILOSEC OTC  Take 20 mg by mouth daily as needed (acid reflux). Reported on 06/14/2015     ondansetron 8 MG tablet  Commonly known as:  ZOFRAN  Take 1 tablet (8 mg total) by mouth 2 (two) times daily as needed (Nausea or vomiting).     traMADol 50 MG tablet  Commonly known as:  ULTRAM  Take 1 tablet (50 mg total) by mouth every 6 (six) hours as needed.     warfarin 5 MG tablet  Commonly known as:  COUMADIN  Take 1 tablet (5 mg total) by mouth daily.         PHYSICAL EXAMINATION  Oncology Vitals 06/14/2015 06/12/2015  Height 169 cm 169 cm  Weight 68.221 kg 66.679 kg  Weight (lbs) 150 lbs 6 oz 147 lbs  BMI (kg/m2) 23.91 kg/m2 23.37 kg/m2  Temp 98.9 98.3  Pulse 94 83  Resp 17 18  SpO2 98 97  BSA (m2) 1.79 m2 1.77 m2   BP Readings from Last 2 Encounters:  06/14/15 118/57  06/12/15 127/70    Physical Exam  Constitutional: She is oriented to person, place, and time and well-developed, well-nourished, and in no distress.  HENT:  Head: Normocephalic and atraumatic.  Mouth/Throat: Oropharynx is clear and moist.  Eyes: Conjunctivae and EOM are normal. Pupils are equal, round, and reactive to light. Right eye exhibits no discharge. Left eye exhibits no discharge. No scleral icterus.  Neck: Normal range of motion.  Pulmonary/Chest: Effort normal. No respiratory distress.  Musculoskeletal: Normal range of motion.  Neurological: She is alert and oriented to person, place, and time. Gait normal.  Skin: Skin is warm and dry. Rash noted. There is erythema. No pallor.  Rash to abdomen and thighs.  Also has some mild erythema around eyes as well.  Psychiatric: Affect normal.    LABORATORY DATA:. Appointment on 06/14/2015  Component Date Value Ref Range Status  . Protime 06/14/2015 18.0* 10.6 - 13.4 Seconds Final  . INR 06/14/2015 1.50* 2.00 - 3.50 Final   Comment: INR is useful only to assess adequacy of anticoagulation  with coumadin when comparing results from different labs. It should not be used to estimate bleeding risk or presence/abscense of coagulopathy in patients not on coumadin. Expected INR ranges for  nontherapeutic patients is 0.88 - 1.12.   Marland Kitchen Lovenox 06/14/2015 YES   Final  Appointment on 06/12/2015  Component Date Value Ref Range Status  . WBC 06/12/2015 3.3* 3.9 - 10.3 10e3/uL Final  . NEUT# 06/12/2015 1.3* 1.5 - 6.5 10e3/uL Final  . HGB 06/12/2015 10.5* 11.6 - 15.9 g/dL Final  . HCT 06/12/2015 32.8* 34.8 - 46.6 % Final  . Platelets 06/12/2015 180  145 - 400 10e3/uL Final  .  MCV 06/12/2015 90.8  79.5 - 101.0 fL Final  . MCH 06/12/2015 29.0  25.1 - 34.0 pg Final  . MCHC 06/12/2015 31.9  31.5 - 36.0 g/dL Final  . RBC 06/12/2015 3.62* 3.70 - 5.45 10e6/uL Final  . RDW 06/12/2015 14.3  11.2 - 14.5 % Final  . lymph# 06/12/2015 1.5  0.9 - 3.3 10e3/uL Final  . MONO# 06/12/2015 0.3  0.1 - 0.9 10e3/uL Final  . Eosinophils Absolute 06/12/2015 0.0  0.0 - 0.5 10e3/uL Final  . Basophils Absolute 06/12/2015 0.0  0.0 - 0.1 10e3/uL Final  . NEUT% 06/12/2015 41.1  38.4 - 76.8 % Final  . LYMPH% 06/12/2015 46.8  14.0 - 49.7 % Final  . MONO% 06/12/2015 10.6  0.0 - 14.0 % Final  . EOS% 06/12/2015 0.9  0.0 - 7.0 % Final  . BASO% 06/12/2015 0.6  0.0 - 2.0 % Final  . Sodium 06/12/2015 140  136 - 145 mEq/L Final  . Potassium 06/12/2015 3.8  3.5 - 5.1 mEq/L Final  . Chloride 06/12/2015 103  98 - 109 mEq/L Final  . CO2 06/12/2015 22  22 - 29 mEq/L Final  . Glucose 06/12/2015 183* 70 - 140 mg/dl Final   Glucose reference range is for nonfasting patients. Fasting glucose reference range is 70- 100.  Marland Kitchen BUN 06/12/2015 16.6  7.0 - 26.0 mg/dL Final  . Creatinine 06/12/2015 0.8  0.6 - 1.1 mg/dL Final  . Total Bilirubin 06/12/2015 <0.30  0.20 - 1.20 mg/dL Final  . Alkaline Phosphatase 06/12/2015 61  40 - 150 U/L Final  . AST 06/12/2015 20  5 - 34 U/L Final  . ALT 06/12/2015 15  0 - 55 U/L Final  . Total Protein  06/12/2015 7.2  6.4 - 8.3 g/dL Final  . Albumin 06/12/2015 3.2* 3.5 - 5.0 g/dL Final  . Calcium 06/12/2015 10.3  8.4 - 10.4 mg/dL Final  . Anion Gap 06/12/2015 15* 3 - 11 mEq/L Final  . EGFR 06/12/2015 66* >90 ml/min/1.73 m2 Final   eGFR is calculated using the CKD-EPI Creatinine Equation (2009)     RADIOGRAPHIC STUDIES: No results found.  ASSESSMENT/PLAN:    Rash Patient was evaluated in the emergency department on Friday evening, 06/08/2015 for left lower extremity erythema and edema.  There was questionable that time, patient was suffering from a DVT or cellulitis; so patient was initiated with Lovenox and Keflex antibiotics.  She returned to the hospital on Saturday, 06/09/2015 to undergo a Doppler ultrasound.  Ultrasound did reveal a positive left lower extremity DVT.  Patient states that she had taken only to Keflex antibiotic tablets, total prior to discontinuing-after she was afforded that she did have a DVT.  She has continued with Lovenox injections twice daily.  She also begin Coumadin 5 mg daily.  On Saturday, 06/09/2015 as well.  Patient began developing a pruritic rash to her abdomen and her thighs.  On Saturday afternoon.  Patient has been taking Benadryl with only minimal effectiveness.  She's also tried some hydrocortisone cream to the rash.  Exam today reveals some scattered pink/red rash to lower abdomen and thighs.  Also, patient has only trace erythema to left lower extremity.  There is no warmth, and only minimal edema.  There is no calf tenderness.  Also, there was some mild erythema to bilateral periorbital areas.  Patient denied any throat swelling or scratchiness.  Reviewed all findings with Dr. Burr Medico; and agreed that the differential diagnosis for rash could be either the Keflex antibiotic  for the Lovenox.  Since it would be inappropriate to discontinue the Lovenox until the INR is therapeutic.-Decision was made to treat rash with Benadryl/Pepcid, and Medrol  Dosepak.  Patient was advised to start the dose pack in the morning since it may make her restless at night.  Will Place Keflex antibiotics on patient's allergy list.  Patient was advised to go directly to the emergency department for any worsening allergy symptoms whatsoever.    Pancreatic cancer [facility]) Patient received cycle 1, day 1 of her gemcitabine only chemotherapy on 06/05/2015.  She received cycle 1, day 8 of the same chemotherapy on 06/12/2015.  She will need to be scheduled for labs, recheck with this symptom management clinic, and her next cycle of chemotherapy on 06/19/2015.  Long term current use of anticoagulant therapy Patient has been diagnosed with a left lower extremity DVT.  She was initiated with Lovenox and Coumadin over the weekend.  INR on recheck today was 1.5.  Since patient is still not therapeutic with her Coumadin; patient will need to continue her Lovenox injections.  The plan is to recheck her labs on 06/19/2015.   Patient stated understanding of all instructions; and was in agreement with this plan of care. The patient knows to call the clinic with any problems, questions or concerns.   Review/collaboration with Dr. Burr Medico regarding all aspects of patient's visit today.   Total time spent with patient was 25 minutes;  with greater than 75 percent of that time spent in face to face counseling regarding patient's symptoms,  and coordination of care and follow up.  Disclaimer:This dictation was prepared with Dragon/digital dictation along with [employer]. Any transcriptional errors that result from this process are unintentional.  Drue Second, NP 06/17/2015

## 2015-06-18 ENCOUNTER — Telehealth: Payer: Self-pay | Admitting: Nurse Practitioner

## 2015-06-18 ENCOUNTER — Encounter: Payer: Medicare Other | Admitting: Nurse Practitioner

## 2015-06-18 ENCOUNTER — Other Ambulatory Visit: Payer: Self-pay | Admitting: Nurse Practitioner

## 2015-06-18 NOTE — Telephone Encounter (Signed)
S/w pt confirming labs and NP/CB added before infusion on 02/14  per 02/13 POF... KJ

## 2015-06-19 ENCOUNTER — Other Ambulatory Visit (HOSPITAL_COMMUNITY)
Admission: AD | Admit: 2015-06-19 | Discharge: 2015-06-19 | Disposition: A | Discharge status: Home / Self Care | Payer: Medicare Other | Source: Ambulatory Visit | Attending: Hematology | Admitting: Hematology

## 2015-06-19 ENCOUNTER — Ambulatory Visit (HOSPITAL_BASED_OUTPATIENT_CLINIC_OR_DEPARTMENT_OTHER): Payer: Medicare Other

## 2015-06-19 ENCOUNTER — Other Ambulatory Visit (HOSPITAL_BASED_OUTPATIENT_CLINIC_OR_DEPARTMENT_OTHER): Payer: Medicare Other

## 2015-06-19 ENCOUNTER — Encounter: Payer: Self-pay | Admitting: Nurse Practitioner

## 2015-06-19 ENCOUNTER — Telehealth: Payer: Self-pay | Admitting: *Deleted

## 2015-06-19 ENCOUNTER — Telehealth: Payer: Self-pay | Admitting: Hematology

## 2015-06-19 ENCOUNTER — Ambulatory Visit (HOSPITAL_BASED_OUTPATIENT_CLINIC_OR_DEPARTMENT_OTHER): Payer: Medicare Other | Admitting: Nurse Practitioner

## 2015-06-19 ENCOUNTER — Other Ambulatory Visit: Payer: Self-pay | Admitting: Nurse Practitioner

## 2015-06-19 VITALS — BP 135/67 | HR 74 | Temp 97.6°F | Resp 18 | Ht 66.5 in | Wt 146.8 lb

## 2015-06-19 DIAGNOSIS — Z5111 Encounter for antineoplastic chemotherapy: Secondary | ICD-10-CM | POA: Diagnosis not present

## 2015-06-19 DIAGNOSIS — Z86718 Personal history of other venous thrombosis and embolism: Secondary | ICD-10-CM | POA: Insufficient documentation

## 2015-06-19 DIAGNOSIS — C259 Malignant neoplasm of pancreas, unspecified: Secondary | ICD-10-CM

## 2015-06-19 DIAGNOSIS — Z7901 Long term (current) use of anticoagulants: Secondary | ICD-10-CM

## 2015-06-19 DIAGNOSIS — E039 Hypothyroidism, unspecified: Secondary | ICD-10-CM

## 2015-06-19 DIAGNOSIS — Z5181 Encounter for therapeutic drug level monitoring: Secondary | ICD-10-CM | POA: Diagnosis present

## 2015-06-19 DIAGNOSIS — C250 Malignant neoplasm of head of pancreas: Secondary | ICD-10-CM

## 2015-06-19 DIAGNOSIS — I82492 Acute embolism and thrombosis of other specified deep vein of left lower extremity: Secondary | ICD-10-CM | POA: Diagnosis not present

## 2015-06-19 DIAGNOSIS — C251 Malignant neoplasm of body of pancreas: Secondary | ICD-10-CM

## 2015-06-19 DIAGNOSIS — R21 Rash and other nonspecific skin eruption: Secondary | ICD-10-CM

## 2015-06-19 LAB — CBC WITH DIFFERENTIAL/PLATELET
BASO%: 0.2 % (ref 0.0–2.0)
Basophils Absolute: 0 10*3/uL (ref 0.0–0.1)
EOS ABS: 0 10*3/uL (ref 0.0–0.5)
EOS%: 0.2 % (ref 0.0–7.0)
HCT: 33.3 % — ABNORMAL LOW (ref 34.8–46.6)
HGB: 10.9 g/dL — ABNORMAL LOW (ref 11.6–15.9)
LYMPH%: 17.6 % (ref 14.0–49.7)
MCH: 29.6 pg (ref 25.1–34.0)
MCHC: 32.9 g/dL (ref 31.5–36.0)
MCV: 90.2 fL (ref 79.5–101.0)
MONO#: 0.4 10*3/uL (ref 0.1–0.9)
MONO%: 7.5 % (ref 0.0–14.0)
NEUT%: 74.5 % (ref 38.4–76.8)
NEUTROS ABS: 4.1 10*3/uL (ref 1.5–6.5)
PLATELETS: 166 10*3/uL (ref 145–400)
RBC: 3.69 10*6/uL — AB (ref 3.70–5.45)
RDW: 14 % (ref 11.2–14.5)
WBC: 5.5 10*3/uL (ref 3.9–10.3)
lymph#: 1 10*3/uL (ref 0.9–3.3)

## 2015-06-19 LAB — PROTIME-INR
INR: 4.24 — ABNORMAL HIGH (ref 0.00–1.49)
Prothrombin Time: 39.7 seconds — ABNORMAL HIGH (ref 11.6–15.2)

## 2015-06-19 LAB — COMPREHENSIVE METABOLIC PANEL
ALBUMIN: 3.2 g/dL — AB (ref 3.5–5.0)
ALK PHOS: 60 U/L (ref 40–150)
ALT: 38 U/L (ref 0–55)
AST: 30 U/L (ref 5–34)
Anion Gap: 12 mEq/L — ABNORMAL HIGH (ref 3–11)
BILIRUBIN TOTAL: 0.31 mg/dL (ref 0.20–1.20)
BUN: 18 mg/dL (ref 7.0–26.0)
CO2: 22 mEq/L (ref 22–29)
CREATININE: 0.9 mg/dL (ref 0.6–1.1)
Calcium: 9.7 mg/dL (ref 8.4–10.4)
Chloride: 102 mEq/L (ref 98–109)
EGFR: 58 mL/min/{1.73_m2} — ABNORMAL LOW (ref >90)
GLUCOSE: 288 mg/dL — AB (ref 70–140)
Potassium: 3.9 mEq/L (ref 3.5–5.1)
SODIUM: 136 meq/L (ref 136–145)
TOTAL PROTEIN: 6.9 g/dL (ref 6.4–8.3)

## 2015-06-19 LAB — TECHNOLOGIST REVIEW

## 2015-06-19 MED ORDER — SODIUM CHLORIDE 0.9 % IV SOLN
850.0000 mg/m2 | Freq: Once | INTRAVENOUS | Status: AC
  Administered 2015-06-19: 1520 mg via INTRAVENOUS
  Filled 2015-06-19: qty 39.98

## 2015-06-19 MED ORDER — SODIUM CHLORIDE 0.9 % IV SOLN
Freq: Once | INTRAVENOUS | Status: AC
  Administered 2015-06-19: 14:00:00 via INTRAVENOUS

## 2015-06-19 MED ORDER — WARFARIN SODIUM 4 MG PO TABS: 4.0000 mg | ORAL_TABLET | Freq: Every day | ORAL | Status: DC

## 2015-06-19 MED ORDER — HYDROCORTISONE 2.5 % EX LOTN: TOPICAL_LOTION | Freq: Two times a day (BID) | CUTANEOUS | Status: DC

## 2015-06-19 NOTE — Assessment & Plan Note (Signed)
Patient has been diagnosed with a DVT; has been taking Lovenox injections on a twice-weekly basis; and also has been taking Coumadin 5 mg on a daily basis.  INR check today is supratherapeutic at 4.2.  Patient was advised to discontinue any further Lovenox injections.  Also, patient was advised to hold any Coumadin today; and to decrease tomorrow to  Coumadin 4 mg tablets on a daily basis.  Patient will return next week for INR check only.

## 2015-06-19 NOTE — Telephone Encounter (Signed)
per pot daughter req to sch pm appts or infusion-sent MW email to sch trmt-daughter will look on MY CHART to get updated copy of avs

## 2015-06-19 NOTE — Assessment & Plan Note (Signed)
Patient presents to the [facility] today to receive cycle 1, day 15 of her gemcitabine chemotherapy regimen.  Blood counts are all within normal limits today.  Patient will need to be scheduled for an INR check.  Only next week.  She will need to be scheduled for labs, visit, and chemotherapy again on 07/03/2015.

## 2015-06-19 NOTE — Telephone Encounter (Signed)
Per staff message I have rescheduled appts to later in the day

## 2015-06-19 NOTE — Assessment & Plan Note (Signed)
Patient was evaluated in the emergency department on Friday evening, 06/08/2015 for left lower extremity erythema and edema.  There was questionable that time, patient was suffering from a DVT or cellulitis; so patient was initiated with Lovenox and Keflex antibiotics.  She returned to the hospital on Saturday, 06/09/2015 to undergo a Doppler ultrasound.  Ultrasound did reveal a positive left lower extremity DVT.  Patient states that she had taken only to Keflex antibiotic tablets, total prior to discontinuing-after she was afforded that she did have a DVT.  She has continued with Lovenox injections twice daily.  She also begin Coumadin 5 mg daily.  On Saturday, 06/09/2015 as well.  Patient began developing a pruritic rash to her abdomen and her thighs.  On Saturday afternoon.  Patient has been taking Benadryl with only minimal effectiveness.  She's also tried some hydrocortisone cream to the rash.  Exam today reveals some scattered pink/red rash to lower abdomen and thighs.  Also, patient has only trace erythema to left lower extremity.  There is no warmth, and only minimal edema.  There is no calf tenderness.  Also, there was some mild erythema to bilateral periorbital areas.  Patient denied any throat swelling or scratchiness.  Reviewed all findings with Dr. Burr Medico; and agreed that the differential diagnosis for rash could be either the Keflex antibiotic for the Lovenox.  Since it would be inappropriate to discontinue the Lovenox until the INR is therapeutic.-Decision was made to treat rash with Benadryl/Pepcid, and Medrol Dosepak.  Patient was advised to start the dose pack in the morning since it may make her restless at night.  Will Place Keflex antibiotics on patient's allergy list.  Patient was advised to go directly to the emergency department for any worsening allergy symptoms whatsoever. ________________________________________________  Update: Patient in for recheck of her rash.  Patient  states that her rash is slowly resolving; but she no longer has any pruritus.  She continues with intermittent Benadryl and Pepcid; and has just completed her Medrol Dosepak.  Exam today reveals resolving scattered rash to her chest wall area in her abdomen.  She also has some rash that is resolving to the top for thighs as well.  Once again reviewed with both patient and her family that the likely culprit for onset of rash was the Keflex antibiotics.  However, cannot completely eliminate the possibility that the Lovenox injections were causing the rash.  Patient was advised today that she can discontinue all further Lovenox injections; since her Coumadin level is actually supratherapeutic.

## 2015-06-19 NOTE — Progress Notes (Signed)
SYMPTOM MANAGEMENT CLINIC   HPI: Nicole Sherman 82 y.o. female diagnosed with pancreatic cancer.  Currently undergoing gemcitabine chemotherapy regimen.  Patient presents to the [facility] today for recheck prior to receiving cycle 1, day 15 of the gemcitabine chemotherapy regimen.  Patient states that her rash is slowly resolving.  She states that all pruritus has resolved.  Patient continues to take Lovenox injections twice daily; and continues with Coumadin 5 mg on a daily basis for previously diagnosed DVT.  Patient denies any other new symptoms whatsoever.  She denies any recent fevers or chills.   HPI  ROS  Past Medical History  Diagnosis Date  . Hypertension   . Hypothyroidism   . Diabetes mellitus without complication ([place])   . Arthritis     osteoarthritis-hands wrist  . Diverticulosis     showing on CT of abdomen 05-16-15  . Cancer [facility])     pancreatic mass- dx. adenocarcinoma" CT abdomen 05-16-15    Past Surgical History  Procedure Laterality Date  . Joint replacement Right   . Shoulder arthroscopy w/ rotator cuff repair Left   . Shoulder open rotator cuff repair Right   . Tubal ligation    . Appendectomy      open  . Ankle surgery Right   . Eye surgery      macular hole surgery repair  . Cataract extraction Right   . Eus N/A 05/28/2015    Procedure: UPPER ENDOSCOPIC ULTRASOUND (EUS) LINEAR;  Surgeon: Arta Silence, MD;  Location: [facility];  Service: Endoscopy;  Laterality: N/A;    has Pancreatic cancer ([place]); Rash; and Long term current use of anticoagulant therapy on her problem list.    is allergic to keflex and aspirin.    Medication List       This list is accurate as of: 06/19/15  5:03 PM.  Always use your most recent med list.               hydrocortisone 2.5 % lotion  Apply topically 2 (two) times daily.     levothyroxine 100 MCG tablet  Commonly known as:  SYNTHROID, LEVOTHROID  Take 100 mcg by mouth daily before breakfast.      lisinopril 20 MG tablet  Commonly known as:  PRINIVIL,ZESTRIL  Take 20 mg by mouth daily.     metFORMIN 1000 MG tablet  Commonly known as:  GLUCOPHAGE  Take 1,000 mg by mouth 2 (two) times daily with a meal.     methylPREDNISolone 4 MG Tbpk tablet  Commonly known as:  MEDROL DOSEPAK  Medrol dose pak.  Take as directed.     omeprazole 20 MG tablet  Commonly known as:  PRILOSEC OTC  Take 20 mg by mouth daily as needed (acid reflux). Reported on 06/14/2015     ondansetron 8 MG tablet  Commonly known as:  ZOFRAN  Take 1 tablet (8 mg total) by mouth 2 (two) times daily as needed (Nausea or vomiting).     traMADol 50 MG tablet  Commonly known as:  ULTRAM  Take 1 tablet (50 mg total) by mouth every 6 (six) hours as needed.     warfarin 4 MG tablet  Commonly known as:  COUMADIN  Take 1 tablet (4 mg total) by mouth daily.         PHYSICAL EXAMINATION  Oncology Vitals 06/19/2015 06/14/2015  Height 169 cm 169 cm  Weight 66.588 kg 68.221 kg  Weight (lbs) 146 lbs 13 oz 150 lbs  6 oz  BMI (kg/m2) 23.34 kg/m2 23.91 kg/m2  Temp 97.6 98.9  Pulse 74 94  Resp 18 17  SpO2 95 98  BSA (m2) 1.77 m2 1.79 m2   BP Readings from Last 2 Encounters:  06/19/15 135/67  06/14/15 118/57    Physical Exam  Constitutional: She is oriented to person, place, and time and well-developed, well-nourished, and in no distress.  HENT:  Head: Normocephalic and atraumatic.  Eyes: Conjunctivae and EOM are normal. Pupils are equal, round, and reactive to light. Right eye exhibits no discharge. Left eye exhibits no discharge. No scleral icterus.  Neck: Normal range of motion.  Pulmonary/Chest: Effort normal. No respiratory distress.  Musculoskeletal: Normal range of motion.  Neurological: She is alert and oriented to person, place, and time. Gait normal.  Skin: Skin is warm and dry. Rash noted. No erythema. No pallor.  Scattered, resolving rash to anterior chest wall, abdomen, and upper thighs.  Patient  also has some healing bruises to her abdomen from her previous Lovenox injections.  Also, patient has what appears to be either scar tissue or healing hematoma to the right lower abdomen at previous surgical site.  This area is nontender.  Psychiatric: Affect normal.  Nursing note and vitals reviewed.   LABORATORY DATA:. Hospital Outpatient Visit on 06/19/2015  Component Date Value Ref Range Status  . Prothrombin Time 06/19/2015 39.7* 11.6 - 15.2 seconds Final  . INR 06/19/2015 4.24* 0.00 - 1.49 Final  Appointment on 06/19/2015  Component Date Value Ref Range Status  . WBC 06/19/2015 5.5  3.9 - 10.3 10e3/uL Final  . NEUT# 06/19/2015 4.1  1.5 - 6.5 10e3/uL Final  . HGB 06/19/2015 10.9* 11.6 - 15.9 g/dL Final  . HCT 06/19/2015 33.3* 34.8 - 46.6 % Final  . Platelets 06/19/2015 166  145 - 400 10e3/uL Final  . MCV 06/19/2015 90.2  79.5 - 101.0 fL Final  . MCH 06/19/2015 29.6  25.1 - 34.0 pg Final  . MCHC 06/19/2015 32.9  31.5 - 36.0 g/dL Final  . RBC 06/19/2015 3.69* 3.70 - 5.45 10e6/uL Final  . RDW 06/19/2015 14.0  11.2 - 14.5 % Final  . lymph# 06/19/2015 1.0  0.9 - 3.3 10e3/uL Final  . MONO# 06/19/2015 0.4  0.1 - 0.9 10e3/uL Final  . Eosinophils Absolute 06/19/2015 0.0  0.0 - 0.5 10e3/uL Final  . Basophils Absolute 06/19/2015 0.0  0.0 - 0.1 10e3/uL Final  . NEUT% 06/19/2015 74.5  38.4 - 76.8 % Final  . LYMPH% 06/19/2015 17.6  14.0 - 49.7 % Final  . MONO% 06/19/2015 7.5  0.0 - 14.0 % Final  . EOS% 06/19/2015 0.2  0.0 - 7.0 % Final  . BASO% 06/19/2015 0.2  0.0 - 2.0 % Final  . Protime 06/19/2015 Sent out for confirmation. See results in EPIC.  10.6 - 13.4 Seconds Final  . INR 06/19/2015 Sent out for confirmation. See results in EPIC.  2.00 - 3.50 Final   Comment: INR is useful only to assess adequacy of anticoagulation with coumadin when comparing results from different labs. It should not be used to estimate bleeding risk or presence/abscense of coagulopathy in patients not on  coumadin. Expected INR ranges for  nontherapeutic patients is 0.88 - 1.12.   Marland Kitchen Lovenox 06/19/2015 No   Final  . Sodium 06/19/2015 136  136 - 145 mEq/L Final  . Potassium 06/19/2015 3.9  3.5 - 5.1 mEq/L Final  . Chloride 06/19/2015 102  98 - 109 mEq/L Final  . CO2  06/19/2015 22  22 - 29 mEq/L Final  . Glucose 06/19/2015 288* 70 - 140 mg/dl Final   Glucose reference range is for nonfasting patients. Fasting glucose reference range is 70- 100.  Marland Kitchen BUN 06/19/2015 18.0  7.0 - 26.0 mg/dL Final  . Creatinine 06/19/2015 0.9  0.6 - 1.1 mg/dL Final  . Total Bilirubin 06/19/2015 0.31  0.20 - 1.20 mg/dL Final  . Alkaline Phosphatase 06/19/2015 60  40 - 150 U/L Final  . AST 06/19/2015 30  5 - 34 U/L Final  . ALT 06/19/2015 38  0 - 55 U/L Final  . Total Protein 06/19/2015 6.9  6.4 - 8.3 g/dL Final  . Albumin 06/19/2015 3.2* 3.5 - 5.0 g/dL Final  . Calcium 06/19/2015 9.7  8.4 - 10.4 mg/dL Final  . Anion Gap 06/19/2015 12* 3 - 11 mEq/L Final  . EGFR 06/19/2015 58* >90 ml/min/1.73 m2 Final   eGFR is calculated using the CKD-EPI Creatinine Equation (2009)  . Technologist Review 06/19/2015 Few Metas and Myelos, 1% NRBC   Final     RADIOGRAPHIC STUDIES: No results found.  ASSESSMENT/PLAN:    Rash Patient was evaluated in the emergency department on Friday evening, 06/08/2015 for left lower extremity erythema and edema.  There was questionable that time, patient was suffering from a DVT or cellulitis; so patient was initiated with Lovenox and Keflex antibiotics.  She returned to the hospital on Saturday, 06/09/2015 to undergo a Doppler ultrasound.  Ultrasound did reveal a positive left lower extremity DVT.  Patient states that she had taken only to Keflex antibiotic tablets, total prior to discontinuing-after she was afforded that she did have a DVT.  She has continued with Lovenox injections twice daily.  She also begin Coumadin 5 mg daily.  On Saturday, 06/09/2015 as well.  Patient began developing a  pruritic rash to her abdomen and her thighs.  On Saturday afternoon.  Patient has been taking Benadryl with only minimal effectiveness.  She's also tried some hydrocortisone cream to the rash.  Exam today reveals some scattered pink/red rash to lower abdomen and thighs.  Also, patient has only trace erythema to left lower extremity.  There is no warmth, and only minimal edema.  There is no calf tenderness.  Also, there was some mild erythema to bilateral periorbital areas.  Patient denied any throat swelling or scratchiness.  Reviewed all findings with Dr. Burr Medico; and agreed that the differential diagnosis for rash could be either the Keflex antibiotic for the Lovenox.  Since it would be inappropriate to discontinue the Lovenox until the INR is therapeutic.-Decision was made to treat rash with Benadryl/Pepcid, and Medrol Dosepak.  Patient was advised to start the dose pack in the morning since it may make her restless at night.  Will Place Keflex antibiotics on patient's allergy list.  Patient was advised to go directly to the emergency department for any worsening allergy symptoms whatsoever. ________________________________________________  Update: Patient in for recheck of her rash.  Patient states that her rash is slowly resolving; but she no longer has any pruritus.  She continues with intermittent Benadryl and Pepcid; and has just completed her Medrol Dosepak.  Exam today reveals resolving scattered rash to her chest wall area in her abdomen.  She also has some rash that is resolving to the top for thighs as well.  Once again reviewed with both patient and her family that the likely culprit for onset of rash was the Keflex antibiotics.  However, cannot completely eliminate the possibility  that the Lovenox injections were causing the rash.  Patient was advised today that she can discontinue all further Lovenox injections; since her Coumadin level is actually  supratherapeutic.        Pancreatic cancer [facility]) Patient presents to the [facility] today to receive cycle 1, day 15 of her gemcitabine chemotherapy regimen.  Blood counts are all within normal limits today.  Patient will need to be scheduled for an INR check.  Only next week.  She will need to be scheduled for labs, visit, and chemotherapy again on 07/03/2015.    Long term current use of anticoagulant therapy Patient has been diagnosed with a DVT; has been taking Lovenox injections on a twice-weekly basis; and also has been taking Coumadin 5 mg on a daily basis.  INR check today is supratherapeutic at 4.2.  Patient was advised to discontinue any further Lovenox injections.  Also, patient was advised to hold any Coumadin today; and to decrease tomorrow to  Coumadin 4 mg tablets on a daily basis.  Patient will return next week for INR check only.    Patient stated understanding of all instructions; and was in agreement with this plan of care. The patient knows to call the clinic with any problems, questions or concerns.   Review/collaboration with Dr. Burr Medico regarding all aspects of patient's visit today.   Total time spent with patient was 25 minutes;  with greater than 75 percent of that time spent in face to face counseling regarding patient's symptoms,  and coordination of care and follow up.  Disclaimer:This dictation was prepared with Dragon/digital dictation along with [employer]. Any transcriptional errors that result from this process are unintentional.  Drue Second, NP 06/19/2015

## 2015-06-20 ENCOUNTER — Telehealth: Payer: Self-pay | Admitting: Hematology

## 2015-06-20 NOTE — Telephone Encounter (Signed)
Spoke with patient to inform her of lab only appointment 2/21 and lab/ov on 2/28 por 2/14 pof

## 2015-06-21 ENCOUNTER — Telehealth: Payer: Self-pay | Admitting: Nurse Practitioner

## 2015-06-21 ENCOUNTER — Other Ambulatory Visit: Payer: Self-pay | Admitting: Nurse Practitioner

## 2015-06-21 ENCOUNTER — Telehealth: Payer: Self-pay | Admitting: *Deleted

## 2015-06-21 DIAGNOSIS — C250 Malignant neoplasm of head of pancreas: Secondary | ICD-10-CM

## 2015-06-21 MED ORDER — METHYLPREDNISOLONE 4 MG PO TBPK: ORAL_TABLET | ORAL | Status: DC

## 2015-06-21 NOTE — Telephone Encounter (Signed)
[facility] NP Cyndee will call pt and call in steroid for her. I will see her back next week. Thanks.  [name] 06/21/2015

## 2015-06-21 NOTE — Telephone Encounter (Signed)
Pt daughter called to report the rash has returned exactly as it was before. Pt states that the rash is very itchy and irritating. Pt has been using benadryl and pepcid with no relief. Pt would like to go back on steroid since that was the only thing that cleared it up. Please advise if pt will need [facility] appt.

## 2015-06-21 NOTE — Telephone Encounter (Signed)
Nurse had received call from patient earlier this morning; stating that her rash continues.  Patient is requesting a refill of the Medrol Dosepak for treatment of the rash.  Patient denies any other new allergic-type symptoms whatsoever.  Since patient only took 2 doses of Keflex antibiotics greater than a week ago; and completed the Lovenox injections on Tuesday, 06/19/2015-will need to consider the possibility that patient is having a drug rash to the gemcitabine chemotherapy instead.  Patient was given a refill of the Medrol Dosepak; and advised that would further evaluate when she returns to the [facility] for follow-up visit next week.  Also, patient was advised to go directly to the emergency department for any worsening allergic-type symptoms whatsoever.

## 2015-06-25 ENCOUNTER — Telehealth: Payer: Self-pay | Admitting: *Deleted

## 2015-06-25 NOTE — Telephone Encounter (Signed)
Pt called to report she is feeling fantastic. She would like permission to go to [place] this [place]. She states it would be about a 4 hour trip. She is on her second round of steroids and feels great. Please call back to advise pt.

## 2015-06-25 NOTE — Telephone Encounter (Signed)
Nicole Sherman, could you call her back and let her know that's fine with me and hope she enjoy her family reunion.   [name]  06/25/2015

## 2015-06-25 NOTE — Telephone Encounter (Signed)
Spoke to pt and relayed message per Dr. Burr Medico.

## 2015-06-26 ENCOUNTER — Telehealth: Payer: Self-pay | Admitting: *Deleted

## 2015-06-26 ENCOUNTER — Other Ambulatory Visit (HOSPITAL_BASED_OUTPATIENT_CLINIC_OR_DEPARTMENT_OTHER): Payer: Medicare Other

## 2015-06-26 ENCOUNTER — Other Ambulatory Visit: Payer: Self-pay | Admitting: *Deleted

## 2015-06-26 DIAGNOSIS — Z7901 Long term (current) use of anticoagulants: Secondary | ICD-10-CM

## 2015-06-26 DIAGNOSIS — C259 Malignant neoplasm of pancreas, unspecified: Secondary | ICD-10-CM

## 2015-06-26 LAB — PROTIME-INR
INR: 3.4 (ref 2.00–3.50)
Protime: 40.8 Seconds — ABNORMAL HIGH (ref 10.6–13.4)

## 2015-06-26 NOTE — Telephone Encounter (Signed)
Called pt & informed to hold coumadin today & tomorrow & restart at 4 mg daily on thurs & repeat PT/INR on 06/13/15 per Selena Lesser NP.

## 2015-06-30 ENCOUNTER — Telehealth: Payer: Self-pay | Admitting: Hematology

## 2015-06-30 NOTE — Telephone Encounter (Signed)
Due to YF out moved 2/28 f/u to CB. Left message for patient re new time for 2/28 @ 2 pm.

## 2015-07-01 ENCOUNTER — Other Ambulatory Visit: Payer: Self-pay | Admitting: Hematology

## 2015-07-03 ENCOUNTER — Ambulatory Visit (HOSPITAL_BASED_OUTPATIENT_CLINIC_OR_DEPARTMENT_OTHER): Payer: Medicare Other

## 2015-07-03 ENCOUNTER — Ambulatory Visit (HOSPITAL_BASED_OUTPATIENT_CLINIC_OR_DEPARTMENT_OTHER): Payer: Medicare Other | Admitting: Nurse Practitioner

## 2015-07-03 ENCOUNTER — Telehealth: Payer: Self-pay | Admitting: Nurse Practitioner

## 2015-07-03 ENCOUNTER — Encounter: Payer: Self-pay | Admitting: Nurse Practitioner

## 2015-07-03 ENCOUNTER — Other Ambulatory Visit (HOSPITAL_BASED_OUTPATIENT_CLINIC_OR_DEPARTMENT_OTHER): Payer: Medicare Other

## 2015-07-03 ENCOUNTER — Ambulatory Visit: Payer: Medicare Other

## 2015-07-03 VITALS — BP 115/54 | HR 87 | Temp 98.5°F | Resp 18 | Ht 66.5 in | Wt 144.0 lb

## 2015-07-03 DIAGNOSIS — Z5111 Encounter for antineoplastic chemotherapy: Secondary | ICD-10-CM | POA: Diagnosis not present

## 2015-07-03 DIAGNOSIS — R21 Rash and other nonspecific skin eruption: Secondary | ICD-10-CM

## 2015-07-03 DIAGNOSIS — C251 Malignant neoplasm of body of pancreas: Secondary | ICD-10-CM

## 2015-07-03 DIAGNOSIS — C250 Malignant neoplasm of head of pancreas: Secondary | ICD-10-CM

## 2015-07-03 DIAGNOSIS — E039 Hypothyroidism, unspecified: Secondary | ICD-10-CM | POA: Diagnosis not present

## 2015-07-03 DIAGNOSIS — Z7901 Long term (current) use of anticoagulants: Secondary | ICD-10-CM | POA: Diagnosis not present

## 2015-07-03 DIAGNOSIS — C259 Malignant neoplasm of pancreas, unspecified: Secondary | ICD-10-CM

## 2015-07-03 DIAGNOSIS — I82492 Acute embolism and thrombosis of other specified deep vein of left lower extremity: Secondary | ICD-10-CM | POA: Diagnosis not present

## 2015-07-03 DIAGNOSIS — R5383 Other fatigue: Secondary | ICD-10-CM

## 2015-07-03 LAB — CBC WITH DIFFERENTIAL/PLATELET
BASO%: 0.9 % (ref 0.0–2.0)
Basophils Absolute: 0.1 10*3/uL (ref 0.0–0.1)
EOS ABS: 0.1 10*3/uL (ref 0.0–0.5)
EOS%: 1.3 % (ref 0.0–7.0)
HEMATOCRIT: 30.8 % — AB (ref 34.8–46.6)
HEMOGLOBIN: 10 g/dL — AB (ref 11.6–15.9)
LYMPH%: 18.6 % (ref 14.0–49.7)
MCH: 29.8 pg (ref 25.1–34.0)
MCHC: 32.6 g/dL (ref 31.5–36.0)
MCV: 91.6 fL (ref 79.5–101.0)
MONO#: 0.9 10*3/uL (ref 0.1–0.9)
MONO%: 15.3 % — AB (ref 0.0–14.0)
NEUT#: 4 10*3/uL (ref 1.5–6.5)
NEUT%: 63.9 % (ref 38.4–76.8)
Platelets: 199 10*3/uL (ref 145–400)
RBC: 3.36 10*6/uL — ABNORMAL LOW (ref 3.70–5.45)
RDW: 15.3 % — ABNORMAL HIGH (ref 11.2–14.5)
WBC: 6.2 10*3/uL (ref 3.9–10.3)
lymph#: 1.1 10*3/uL (ref 0.9–3.3)

## 2015-07-03 LAB — COMPREHENSIVE METABOLIC PANEL
ALBUMIN: 3 g/dL — AB (ref 3.5–5.0)
ALK PHOS: 75 U/L (ref 40–150)
ALT: 11 U/L (ref 0–55)
AST: 9 U/L (ref 5–34)
Anion Gap: 11 mEq/L (ref 3–11)
BUN: 15 mg/dL (ref 7.0–26.0)
CALCIUM: 8.9 mg/dL (ref 8.4–10.4)
CHLORIDE: 106 meq/L (ref 98–109)
CO2: 20 mEq/L — ABNORMAL LOW (ref 22–29)
Creatinine: 0.9 mg/dL (ref 0.6–1.1)
EGFR: 60 mL/min/{1.73_m2} — AB (ref >90)
Glucose: 283 mg/dl — ABNORMAL HIGH (ref 70–140)
POTASSIUM: 3.9 meq/L (ref 3.5–5.1)
Sodium: 137 mEq/L (ref 136–145)
Total Bilirubin: 0.31 mg/dL (ref 0.20–1.20)
Total Protein: 6.2 g/dL — ABNORMAL LOW (ref 6.4–8.3)

## 2015-07-03 LAB — PROTIME-INR
INR: 2.6 (ref 2.00–3.50)
Protime: 31.2 Seconds — ABNORMAL HIGH (ref 10.6–13.4)

## 2015-07-03 MED ORDER — SODIUM CHLORIDE 0.9 % IV SOLN
Freq: Once | INTRAVENOUS | Status: AC
  Administered 2015-07-03: 16:00:00 via INTRAVENOUS

## 2015-07-03 MED ORDER — SODIUM CHLORIDE 0.9 % IV SOLN
850.0000 mg/m2 | Freq: Once | INTRAVENOUS | Status: AC
  Administered 2015-07-03: 1520 mg via INTRAVENOUS
  Filled 2015-07-03: qty 39.98

## 2015-07-03 MED ORDER — PROCHLORPERAZINE MALEATE 10 MG PO TABS
10.0000 mg | ORAL_TABLET | Freq: Once | ORAL | Status: AC
  Administered 2015-07-03: 10 mg via ORAL

## 2015-07-03 MED ORDER — PROCHLORPERAZINE MALEATE 10 MG PO TABS
ORAL_TABLET | ORAL | Status: AC
  Filled 2015-07-03: qty 1

## 2015-07-03 NOTE — Telephone Encounter (Signed)
pt to follow sch as directed

## 2015-07-03 NOTE — Assessment & Plan Note (Signed)
Patient is happy to report that her generalized rash has essentially resolved.  It was presumed that the rash was associated with her chemotherapy.  Patient took Benadryl/Pepcid; and also required 2 rounds of Medrol dose pack for treatment of the rash.  Exam today reveals all rash has resolved.  Advised patient to start taking Benadryl and Pepcid again if her rash returns.

## 2015-07-03 NOTE — Patient Instructions (Signed)
[facility] Discharge Instructions for Patients Receiving Chemotherapy  Today you received the following chemotherapy agents Gemzar  To help prevent nausea and vomiting after your treatment, we encourage you to take your nausea medication as directed.    If you develop nausea and vomiting that is not controlled by your nausea medication, call the clinic.   BELOW ARE SYMPTOMS THAT SHOULD BE REPORTED IMMEDIATELY:  *FEVER GREATER THAN 100.5 F  *CHILLS WITH OR WITHOUT FEVER  NAUSEA AND VOMITING THAT IS NOT CONTROLLED WITH YOUR NAUSEA MEDICATION  *UNUSUAL SHORTNESS OF BREATH  *UNUSUAL BRUISING OR BLEEDING  TENDERNESS IN MOUTH AND THROAT WITH OR WITHOUT PRESENCE OF ULCERS  *URINARY PROBLEMS  *BOWEL PROBLEMS  UNUSUAL RASH Items with * indicate a potential emergency and should be followed up as soon as possible.  Feel free to call the clinic you have any questions or concerns. The clinic phone number is [phone].  Please show the CHEMO ALERT CARD at check-in to the Emergency Department and triage nurse.   

## 2015-07-03 NOTE — Telephone Encounter (Signed)
per pof to sch pt appt-pt seems to think seeing MD i 3 weeks ina  row is incorrect-sch per pt req-pt will get updated copy off MY CHART

## 2015-07-03 NOTE — Progress Notes (Signed)
SYMPTOM MANAGEMENT CLINIC   HPI: Nicole Sherman 82 y.o. female diagnosed with pancreatic cancer.  Currently undergoing gemcitabine chemotherapy regimen.  Patient presented to the [facility] today to receive her next cycle of gemcitabine chemotherapy regimen.  She states that her rash has resolved.  She continues to complain of some mild fatigue.  She denies any other new symptoms.  She denies any recent fevers or chills.   HPI  ROS  Past Medical History  Diagnosis Date  . Hypertension   . Hypothyroidism   . Diabetes mellitus without complication ([place])   . Arthritis     osteoarthritis-hands wrist  . Diverticulosis     showing on CT of abdomen 05-16-15  . Cancer [facility])     pancreatic mass- dx. adenocarcinoma" CT abdomen 05-16-15    Past Surgical History  Procedure Laterality Date  . Joint replacement Right   . Shoulder arthroscopy w/ rotator cuff repair Left   . Shoulder open rotator cuff repair Right   . Tubal ligation    . Appendectomy      open  . Ankle surgery Right   . Eye surgery      macular hole surgery repair  . Cataract extraction Right   . Eus N/A 05/28/2015    Procedure: UPPER ENDOSCOPIC ULTRASOUND (EUS) LINEAR;  Surgeon: Arta Silence, MD;  Location: [facility];  Service: Endoscopy;  Laterality: N/A;    has Pancreatic cancer ([place]); Rash; and Long term current use of anticoagulant therapy on her problem list.    is allergic to keflex and aspirin.    Medication List       This list is accurate as of: 07/03/15  4:29 PM.  Always use your most recent med list.               hydrocortisone 2.5 % lotion  Apply topically 2 (two) times daily.     levothyroxine 100 MCG tablet  Commonly known as:  SYNTHROID, LEVOTHROID  Take 100 mcg by mouth daily before breakfast.     lisinopril 20 MG tablet  Commonly known as:  PRINIVIL,ZESTRIL  Take 20 mg by mouth daily.     metFORMIN 1000 MG tablet  Commonly known as:  GLUCOPHAGE  Take 1,000 mg by mouth 2  (two) times daily with a meal.     omeprazole 20 MG tablet  Commonly known as:  PRILOSEC OTC  Take 20 mg by mouth daily as needed (acid reflux). Reported on 07/03/2015     ondansetron 8 MG tablet  Commonly known as:  ZOFRAN  Take 1 tablet (8 mg total) by mouth 2 (two) times daily as needed (Nausea or vomiting).     traMADol 50 MG tablet  Commonly known as:  ULTRAM  Take 1 tablet (50 mg total) by mouth every 6 (six) hours as needed.     warfarin 4 MG tablet  Commonly known as:  COUMADIN  Take 1 tablet (4 mg total) by mouth daily.         PHYSICAL EXAMINATION  Oncology Vitals 07/03/2015 06/19/2015  Height 169 cm 169 cm  Weight 65.318 kg 66.588 kg  Weight (lbs) 144 lbs 146 lbs 13 oz  BMI (kg/m2) 22.89 kg/m2 23.34 kg/m2  Temp 98.5 97.6  Pulse 87 74  Resp 18 18  SpO2 98 95  BSA (m2) 1.75 m2 1.77 m2   BP Readings from Last 2 Encounters:  07/03/15 115/54  06/19/15 135/67    Physical Exam  Constitutional: She is  oriented to person, place, and time and well-developed, well-nourished, and in no distress.  HENT:  Head: Normocephalic and atraumatic.  Mouth/Throat: Oropharynx is clear and moist.  Eyes: Conjunctivae and EOM are normal. Pupils are equal, round, and reactive to light. Right eye exhibits no discharge. Left eye exhibits no discharge. No scleral icterus.  Neck: Normal range of motion.  Pulmonary/Chest: Effort normal. No respiratory distress.  Musculoskeletal: Normal range of motion. She exhibits no edema or tenderness.  Neurological: She is alert and oriented to person, place, and time. Gait normal.  Skin: Skin is warm and dry. No rash noted. No erythema. No pallor.  Psychiatric: Affect normal.  Nursing note and vitals reviewed.   LABORATORY DATA:. Appointment on 07/03/2015  Component Date Value Ref Range Status  . WBC 07/03/2015 6.2  3.9 - 10.3 10e3/uL Final  . NEUT# 07/03/2015 4.0  1.5 - 6.5 10e3/uL Final  . HGB 07/03/2015 10.0* 11.6 - 15.9 g/dL Final  . HCT  07/03/2015 30.8* 34.8 - 46.6 % Final  . Platelets 07/03/2015 199  145 - 400 10e3/uL Final  . MCV 07/03/2015 91.6  79.5 - 101.0 fL Final  . MCH 07/03/2015 29.8  25.1 - 34.0 pg Final  . MCHC 07/03/2015 32.6  31.5 - 36.0 g/dL Final  . RBC 07/03/2015 3.36* 3.70 - 5.45 10e6/uL Final  . RDW 07/03/2015 15.3* 11.2 - 14.5 % Final  . lymph# 07/03/2015 1.1  0.9 - 3.3 10e3/uL Final  . MONO# 07/03/2015 0.9  0.1 - 0.9 10e3/uL Final  . Eosinophils Absolute 07/03/2015 0.1  0.0 - 0.5 10e3/uL Final  . Basophils Absolute 07/03/2015 0.1  0.0 - 0.1 10e3/uL Final  . NEUT% 07/03/2015 63.9  38.4 - 76.8 % Final  . LYMPH% 07/03/2015 18.6  14.0 - 49.7 % Final  . MONO% 07/03/2015 15.3* 0.0 - 14.0 % Final  . EOS% 07/03/2015 1.3  0.0 - 7.0 % Final  . BASO% 07/03/2015 0.9  0.0 - 2.0 % Final  . Sodium 07/03/2015 137  136 - 145 mEq/L Final  . Potassium 07/03/2015 3.9  3.5 - 5.1 mEq/L Final  . Chloride 07/03/2015 106  98 - 109 mEq/L Final  . CO2 07/03/2015 20* 22 - 29 mEq/L Final  . Glucose 07/03/2015 283* 70 - 140 mg/dl Final   Glucose reference range is for nonfasting patients. Fasting glucose reference range is 70- 100.  Marland Kitchen BUN 07/03/2015 15.0  7.0 - 26.0 mg/dL Final  . Creatinine 07/03/2015 0.9  0.6 - 1.1 mg/dL Final  . Total Bilirubin 07/03/2015 0.31  0.20 - 1.20 mg/dL Final  . Alkaline Phosphatase 07/03/2015 75  40 - 150 U/L Final  . AST 07/03/2015 9  5 - 34 U/L Final  . ALT 07/03/2015 11  0 - 55 U/L Final  . Total Protein 07/03/2015 6.2* 6.4 - 8.3 g/dL Final  . Albumin 07/03/2015 3.0* 3.5 - 5.0 g/dL Final  . Calcium 07/03/2015 8.9  8.4 - 10.4 mg/dL Final  . Anion Gap 07/03/2015 11  3 - 11 mEq/L Final  . EGFR 07/03/2015 60* >90 ml/min/1.73 m2 Final   eGFR is calculated using the CKD-EPI Creatinine Equation (2009)  . Protime 07/03/2015 31.2* 10.6 - 13.4 Seconds Final  . INR 07/03/2015 2.60  2.00 - 3.50 Final   Comment: INR is useful only to assess adequacy of anticoagulation with coumadin when comparing  results from different labs. It should not be used to estimate bleeding risk or presence/abscense of coagulopathy in patients not on coumadin. Expected  INR ranges for  nontherapeutic patients is 0.88 - 1.12.   Marland Kitchen Lovenox 07/03/2015 No   Final     RADIOGRAPHIC STUDIES: No results found.  ASSESSMENT/PLAN:    Rash Patient is happy to report that her generalized rash has essentially resolved.  It was presumed that the rash was associated with her chemotherapy.  Patient took Benadryl/Pepcid; and also required 2 rounds of Medrol dose pack for treatment of the rash.  Exam today reveals all rash has resolved.  Advised patient to start taking Benadryl and Pepcid again if her rash returns.  Pancreatic cancer [facility]) Patient presented to the [facility] today for cycle 2, day 1 of her gemcitabine chemotherapy regimen.  Patient states she's been doing fairly well recently; with the exception of some mild fatigue.  She denies any new symptoms whatsoever.  She denies any recent fevers or chills.  Blood counts obtained today were within normal limits.  CA 19-9 tumor marker is pending results.  Patient will proceed today with cycle 2, day 1 of her gemcitabine chemotherapy as planned.  Patient will return for labs, visit, and her next cycle of chemotherapy on 07/10/2015.  Also, patient and her family are questioning when the next restaging scan will be scheduled.  Advised patient and her family would review with Dr. Burr Medico; and let them know.  Long term current use of anticoagulant therapy Patient has a history of DVT; and continues to take Coumadin 4 mg on a daily basis.  INR today was 2.6.  Patient was advised to continue taking the Coumadin as previously directed.  Patient stated understanding of all instructions; and was in agreement with this plan of care. The patient knows to call the clinic with any problems, questions or concerns.   Review/collaboration with Dr. Burr Medico regarding all aspects of  patient's visit today.   Total time spent with patient was 25 minutes;  with greater than 75 percent of that time spent in face to face counseling regarding patient's symptoms,  and coordination of care and follow up.  Disclaimer:This dictation was prepared with Dragon/digital dictation along with [employer]. Any transcriptional errors that result from this process are unintentional.  Drue Second, NP 07/03/2015

## 2015-07-03 NOTE — Assessment & Plan Note (Signed)
Patient has a history of DVT; and continues to take Coumadin 4 mg on a daily basis.  INR today was 2.6.  Patient was advised to continue taking the Coumadin as previously directed.

## 2015-07-03 NOTE — Assessment & Plan Note (Signed)
Patient presented to the [facility] today for cycle 2, day 1 of her gemcitabine chemotherapy regimen.  Patient states she's been doing fairly well recently; with the exception of some mild fatigue.  She denies any new symptoms whatsoever.  She denies any recent fevers or chills.  Blood counts obtained today were within normal limits.  CA 19-9 tumor marker is pending results.  Patient will proceed today with cycle 2, day 1 of her gemcitabine chemotherapy as planned.  Patient will return for labs, visit, and her next cycle of chemotherapy on 07/10/2015.  Also, patient and her family are questioning when the next restaging scan will be scheduled.  Advised patient and her family would review with Dr. Burr Medico; and let them know.

## 2015-07-04 LAB — CANCER ANTIGEN 19-9: CAN 19-9: 227 U/mL — AB (ref 0–35)

## 2015-07-09 ENCOUNTER — Ambulatory Visit (HOSPITAL_BASED_OUTPATIENT_CLINIC_OR_DEPARTMENT_OTHER): Payer: Medicare Other

## 2015-07-09 ENCOUNTER — Ambulatory Visit (HOSPITAL_BASED_OUTPATIENT_CLINIC_OR_DEPARTMENT_OTHER): Payer: Medicare Other | Admitting: Hematology

## 2015-07-09 ENCOUNTER — Other Ambulatory Visit: Payer: Self-pay | Admitting: *Deleted

## 2015-07-09 ENCOUNTER — Telehealth: Payer: Self-pay | Admitting: Hematology

## 2015-07-09 ENCOUNTER — Telehealth: Payer: Self-pay | Admitting: *Deleted

## 2015-07-09 ENCOUNTER — Encounter: Payer: Self-pay | Admitting: Hematology

## 2015-07-09 VITALS — BP 100/51 | HR 86 | Temp 98.8°F | Resp 18 | Ht 66.5 in | Wt 143.5 lb

## 2015-07-09 DIAGNOSIS — R110 Nausea: Secondary | ICD-10-CM

## 2015-07-09 DIAGNOSIS — R197 Diarrhea, unspecified: Secondary | ICD-10-CM | POA: Diagnosis not present

## 2015-07-09 DIAGNOSIS — C250 Malignant neoplasm of head of pancreas: Secondary | ICD-10-CM

## 2015-07-09 DIAGNOSIS — E860 Dehydration: Secondary | ICD-10-CM

## 2015-07-09 DIAGNOSIS — Z5189 Encounter for other specified aftercare: Secondary | ICD-10-CM | POA: Diagnosis not present

## 2015-07-09 DIAGNOSIS — I824Z2 Acute embolism and thrombosis of unspecified deep veins of left distal lower extremity: Secondary | ICD-10-CM | POA: Diagnosis not present

## 2015-07-09 DIAGNOSIS — R21 Rash and other nonspecific skin eruption: Secondary | ICD-10-CM | POA: Diagnosis not present

## 2015-07-09 DIAGNOSIS — E039 Hypothyroidism, unspecified: Secondary | ICD-10-CM

## 2015-07-09 DIAGNOSIS — C258 Malignant neoplasm of overlapping sites of pancreas: Secondary | ICD-10-CM

## 2015-07-09 DIAGNOSIS — Z86718 Personal history of other venous thrombosis and embolism: Secondary | ICD-10-CM | POA: Insufficient documentation

## 2015-07-09 LAB — CBC WITH DIFFERENTIAL/PLATELET
BASO%: 1 % (ref 0.0–2.0)
Basophils Absolute: 0 10*3/uL (ref 0.0–0.1)
EOS ABS: 0 10*3/uL (ref 0.0–0.5)
EOS%: 1.2 % (ref 0.0–7.0)
HCT: 28.8 % — ABNORMAL LOW (ref 34.8–46.6)
HEMOGLOBIN: 9.4 g/dL — AB (ref 11.6–15.9)
LYMPH%: 20 % (ref 14.0–49.7)
MCH: 30.2 pg (ref 25.1–34.0)
MCHC: 32.7 g/dL (ref 31.5–36.0)
MCV: 92.3 fL (ref 79.5–101.0)
MONO#: 0.4 10*3/uL (ref 0.1–0.9)
MONO%: 15.1 % — AB (ref 0.0–14.0)
NEUT%: 62.7 % (ref 38.4–76.8)
NEUTROS ABS: 1.8 10*3/uL (ref 1.5–6.5)
Platelets: 383 10*3/uL (ref 145–400)
RBC: 3.12 10*6/uL — ABNORMAL LOW (ref 3.70–5.45)
RDW: 15.3 % — AB (ref 11.2–14.5)
WBC: 2.9 10*3/uL — AB (ref 3.9–10.3)
lymph#: 0.6 10*3/uL — ABNORMAL LOW (ref 0.9–3.3)

## 2015-07-09 LAB — COMPREHENSIVE METABOLIC PANEL
ALBUMIN: 2.5 g/dL — AB (ref 3.5–5.0)
ALT: 11 U/L (ref 0–55)
AST: 14 U/L (ref 5–34)
Alkaline Phosphatase: 78 U/L (ref 40–150)
Anion Gap: 11 mEq/L (ref 3–11)
BUN: 14.6 mg/dL (ref 7.0–26.0)
CO2: 22 meq/L (ref 22–29)
Calcium: 9.3 mg/dL (ref 8.4–10.4)
Chloride: 102 mEq/L (ref 98–109)
Creatinine: 0.8 mg/dL (ref 0.6–1.1)
EGFR: 67 mL/min/{1.73_m2} — AB (ref >90)
Glucose: 205 mg/dl — ABNORMAL HIGH (ref 70–140)
Potassium: 4.1 mEq/L (ref 3.5–5.1)
SODIUM: 134 meq/L — AB (ref 136–145)
TOTAL PROTEIN: 6.4 g/dL (ref 6.4–8.3)
Total Bilirubin: 0.39 mg/dL (ref 0.20–1.20)

## 2015-07-09 MED ORDER — SODIUM CHLORIDE 0.9 % IV SOLN
INTRAVENOUS | Status: DC
  Administered 2015-07-09: 13:00:00 via INTRAVENOUS

## 2015-07-09 MED ORDER — CAPECITABINE 500 MG PO TABS: 850.0000 mg/m2 | ORAL_TABLET | Freq: Two times a day (BID) | ORAL | Status: DC

## 2015-07-09 NOTE — Progress Notes (Signed)
[place]  [phone] [phone]  Clinic Follow Up Note   Patient Care Team: Hulan Fess, MD as PCP - General (Family Medicine) [name], MD as Consulting Physician (Hematology) Arta Silence, MD as Consulting Physician (Gastroenterology) 07/09/2015  CHIEF COMPLAINTS:  Follow up unresectable pancreatic adenocarcinoma    OTHER RELATED ISSUES 1. Left LE DVT diagnosed on 06/09/2015, started on lovenox and bridged to coumadin   Oncology History   Pancreatic cancer [facility])   Staging form: Pancreas, AJCC 7th Edition     Clinical stage from 05/27/2014: Stage IIB (T2, N1, M0) - Signed by [name], MD on 05/31/2015       Pancreatic cancer ([place])   05/16/2015 Imaging CT chest, abdomen and pelvis with contrast showed a bulky mass at pancreatic neck/body, tumor encases the celiac and proximal branches and a cruise the main portal vein, peripancreatic adenopathy. no distant mets.    05/28/2015 Initial Biopsy Pancreatic mass needle biopsy showed well differentiated adenocarcinoma   05/31/2015 Initial Diagnosis Pancreatic cancer ([place])   05/31/2015 Tumor Marker CA19.9 =375   06/05/2015 -  Chemotherapy weekly gemcitabine 1000mg /m2, dose reduced to 850mg /m2 on week 2 due to tolerance issue    HISTORY OF PRESENTING ILLNESS:  Nicole Sherman 82 y.o. female is here because of her recent abnormal CT scan, which showed a pink vaginal mass, highly suspicious for pancreatic cancer.  She has been haivng epigastric pain after meal for 4-5 months. She also reports left side pain at night when she sleeps on left side, mild back pain, the pain is worse lately, lasts longer especially afte rdinner, it about 5/10, appetite is lower than before, she lost aobut 10-20lbs in the past 5 months. No other complains, she had loose BM 1-2 time BM for 8-10 months, no hematochezia or melena. She was evaluated by her primary care physician Dr. Rex Kras, abdominal ultrasound was negative on  05/04/2015. She underwent a CT abdomen and pelvis with contrast on 05/16/2015, which showed a 4 x 1 cm mass in pancreatic body and a neck, tumor encases the distal celiac and proximal branches and occludes the main portal vein. No distant metastasis on the CT scan. She was referred to [place] for further workup.  She has good energy level, she is able to do all house work. She has intermittent dizziness from blood pressure meds. No other complains.   CURRENT THERAPY: weekly gemcitabine 1000 mg/m, dose reduced to 850 mg/m on week 2 due to tolerance issue  INTERIM HISTORY: Sure returns for follow-up. She is accompanied her husband and 2 daughters. She had a good week when she was off chemotherapy 2 weeks ago, no significant abdominal pain, had a good appetite and energy level, and went to beach with her family. She started cycle 2 chemotherapy will week ago, and developed fatigue, taste change, last appetite to 3 days later, had a diarrhea 5-6 times this morning, has eaten very little for the past few days. No dizziness, no fever or chills, she complains of intermittent epigastric pain, 5-7/10, and a skin rash recurred in the breasts, left axilla and upper abdomen area. Mild itchiness. She feels quite fatigued today., came in with a wheelchair.   MEDICAL HISTORY:  Past Medical History  Diagnosis Date  . Hypertension   . Hypothyroidism   . Diabetes mellitus without complication ([place])   . Arthritis     osteoarthritis-hands wrist  . Diverticulosis     showing on CT of abdomen 05-16-15  . Cancer (  [place])     pancreatic mass- dx. adenocarcinoma" CT abdomen 05-16-15    SURGICAL HISTORY: Past Surgical History  Procedure Laterality Date  . Joint replacement Right   . Shoulder arthroscopy w/ rotator cuff repair Left   . Shoulder open rotator cuff repair Right   . Tubal ligation    . Appendectomy      open  . Ankle surgery Right   . Eye surgery      macular hole surgery repair  . Cataract extraction  Right   . Eus N/A 05/28/2015    Procedure: UPPER ENDOSCOPIC ULTRASOUND (EUS) LINEAR;  Surgeon: Arta Silence, MD;  Location: [facility] END[facility];  Service: Endoscopy;  Laterality: N/A;    SOCIAL HISTORY: Social History   Social History  . Marital Status: Married    Spouse Name: N/A  . Number of Children: 2 daughters   . Years of Education: N/A   Occupational History  . Not on file.   Social History Main Topics  . Smoking status: Former Smoker -- 1.00 packs/day for 30 years    Types: Cigarettes    Quit date: 05/21/1986  . Smokeless tobacco: Not on file  . Alcohol Use: No  . Drug Use: No  . Sexual Activity: Not on file   Other Topics Concern  . Not on file   Social History Narrative    FAMILY HISTORY: Family History  Problem Relation Age of Onset  . Stroke Mother   . Cancer Sister     lung cancer     ALLERGIES:  is allergic to keflex and aspirin.  MEDICATIONS:  Current Outpatient Prescriptions  Medication Sig Dispense Refill  . levothyroxine (SYNTHROID, LEVOTHROID) 100 MCG tablet Take 100 mcg by mouth daily before breakfast.     . lisinopril (PRINIVIL,ZESTRIL) 20 MG tablet Take 20 mg by mouth daily.     . metFORMIN (GLUCOPHAGE) 1000 MG tablet Take 1,000 mg by mouth 2 (two) times daily with a meal.     . omeprazole (PRILOSEC OTC) 20 MG tablet Take 20 mg by mouth daily as needed (acid reflux). Reported on 07/03/2015    . ondansetron (ZOFRAN) 8 MG tablet Take 1 tablet (8 mg total) by mouth 2 (two) times daily as needed (Nausea or vomiting). 30 tablet 1  . traMADol (ULTRAM) 50 MG tablet Take 1 tablet (50 mg total) by mouth every 6 (six) hours as needed. (Patient taking differently: Take 25-50 mg by mouth every 6 (six) hours as needed for moderate pain. ) 30 tablet 0  . warfarin (COUMADIN) 4 MG tablet Take 1 tablet (4 mg total) by mouth daily. 30 tablet 1  . capecitabine (XELODA) 500 MG tablet Take 3 tablets (1,500 mg total) by mouth 2 (two) times daily after a meal. 2 weeks on  and 1 week off 84 tablet 0  . hydrocortisone 2.5 % lotion Apply topically 2 (two) times daily. (Patient not taking: Reported on 07/03/2015) 59 mL 0   No current facility-administered medications for this visit.    REVIEW OF SYSTEMS:   Constitutional: Denies fevers, chills or abnormal night sweats Eyes: Denies blurriness of vision, double vision or watery eyes Ears, nose, mouth, throat, and face: Denies mucositis or sore throat Respiratory: Denies cough, dyspnea or wheezes Cardiovascular: Denies palpitation, chest discomfort or lower extremity swelling Gastrointestinal:  Denies nausea, heartburn or change in bowel habits Skin: Denies abnormal skin rashes Lymphatics: Denies new lymphadenopathy or easy bruising Neurological:Denies numbness, tingling or new weaknesses Behavioral/Psych: Mood is stable, no new  changes  All other systems were reviewed with the patient and are negative.  PHYSICAL EXAMINATION: ECOG PERFORMANCE STATUS: 2-3  Filed Vitals:   07/09/15 1108  BP: 100/51  Pulse: 86  Temp: 98.8 F (37.1 C)  Resp: 18   Filed Weights   07/09/15 1108  Weight: 143 lb 8 oz (65.091 kg)    GENERAL:alert, no distress and comfortable SKIN: skin color, texture, turgor are normal, mild scatter skin rashes involving both breasts and upper agdomen EYES: normal, conjunctiva are pink and non-injected, sclera clear OROPHARYNX:no exudate, no erythema and lips, buccal mucosa, and tongue normal  NECK: supple, thyroid normal size, non-tender, without nodularity LYMPH:  no palpable lymphadenopathy in the cervical, axillary or inguinal LUNGS: clear to auscultation and percussion with normal breathing effort HEART: regular rate & rhythm and no murmurs and no lower extremity edema ABDOMEN:abdomen soft, non-tender and normal bowel sounds Musculoskeletal:no cyanosis of digits and no clubbing  PSYCH: alert & oriented x 3 with fluent speech NEURO: no focal motor/sensory deficits  LABORATORY DATA:    I have reviewed the data as listed  CBC Latest Ref Rng 07/09/2015 07/03/2015 06/19/2015  WBC 3.9 - 10.3 10e3/uL 2.9(L) 6.2 5.5  Hemoglobin 11.6 - 15.9 g/dL 9.4(L) 10.0(L) 10.9(L)  Hematocrit 34.8 - 46.6 % 28.8(L) 30.8(L) 33.3(L)  Platelets 145 - 400 10e3/uL 383 199 166   CMP Latest Ref Rng 07/09/2015 07/03/2015 06/19/2015  Glucose 70 - 140 mg/dl 205(H) 283(H) 288(H)  BUN 7.0 - 26.0 mg/dL 14.6 15.0 18.0  Creatinine 0.6 - 1.1 mg/dL 0.8 0.9 0.9  Sodium 136 - 145 mEq/L 134(L) 137 136  Potassium 3.5 - 5.1 mEq/L 4.1 3.9 3.9  CO2 22 - 29 mEq/L 22 20(L) 22  Calcium 8.4 - 10.4 mg/dL 9.3 8.9 9.7  Total Protein 6.4 - 8.3 g/dL 6.4 6.2(L) 6.9  Total Bilirubin 0.20 - 1.20 mg/dL 0.39 0.31 0.31  Alkaline Phos 40 - 150 U/L 78 75 60  AST 5 - 34 U/L 14 9 30   ALT 0 - 55 U/L 11 11 38   CA19.9 u/ML 05/31/2015: 751 07/03/2015: 227  PATHOLOGY REPORT  Diagnosis 05/28/2015 FINE NEEDLE ASPIRATION: NEEDLE ASPIRATION, PANCREAS BODY (SPECIMEN 1 OF 1 COLLECTED 05/28/15): WELL DIFFERENTIATED ADENOCARCINOMA. Preliminary Diagnosis Intraoperative Diagnosis: Adequate (JDP)   RADIOGRAPHIC STUDIES: I have personally reviewed the radiological images as listed and agreed with the findings in the report. No results found.  ASSESSMENT & PLAN:  82 year old Caucasian female, presented with intermittent abdominal pain, weight loss, and a CT finding of a pancreatic mass.  1. Pancreatic body/head adenocarcinoma, well differentiated, cT2N1M0, stage IIB, unresectable -I reviewed her CT chest results, which was negative for metastatic disease. -The biopsy results was reviewed with her and her family members in detail. -Her case was discussed in our GI tumor Board yesterday, Dr. Barry Dienes feels this is not resectable disease. -I reviewed the nature history of pancreatic cancer, which is aggressive. Her disease is incurable at this stage, and the goal of therapy is palliative and prolong her life. -I recommend palliative systemic  chemotherapy -she has been tolerating first line chemo gemcitabine poorly, with diarrhea, poor appetite, and skin rash. I recommend her to change to second line chemotherapy Xeloda, 850 milligram per square meter, twice daily, 2 weeks on and one-week off. --Chemotherapy consent: Side effects including but does not not limited to, fatigue, nausea, vomiting, diarrhea, hair loss, neuropathy, fluid retention, renal and kidney dysfunction, neutropenic fever, needed for blood transfusion, bleeding, coronary artery spasm and heart attack,  were discussed with patient in great detail. She agrees to proceed. -alternatively, observation and palliative care alone is also very reasonable given her advanced age and limited tolerance to chemotherapy -Her tumor marker CA 19.9 has dropped significantly since she started chemotherapy, probably indicating response to chemotherapy. I'll obtain a restaging CT scan next week before we start her on Xeloda. -Due to the interaction of Xeloda and Coumadin, I'll decrease her Coumadin dose when she is on Xeloda and monitor her INR closely.  2. Left LE DVT -Probably provoked by her underlying malignancy and chemotherapy -I recommend anticoagulation indefinitely, giving her incurable malignancy, if no contraindications such as bleeding, occasions. -She is on coumadin 4mg  daily, INR therapeutic. She is tolerating well -Due to the interaction of Coumadin and Xeloda, I'll decrease her Coumadin to 3 mg daily when she is on Xeloda, and continue 4 mg daily when she is off Xeloda, and monitor her INR closely  3. Fatigue, nausea and diarrhea -secondary to chemo, she has recovered well -We reviewed the management of nausea and diarrhea with her and her family members again. -I encouraged her to have nutritional supplement, and be physically as active as she can   4.HTN, DM, hypothyroidism  -follow up with PCP  -We will monitor her blood pressure and blood glucose closely during her  therapy, which may be affected by dehydration, and steroids as pre-meds.   5. Skin rash -probably related to gemcitabine, previously responded to steroids -mild today, will observe for now since I will stop gemcitabine  Plan -stop gemcitabine  -I will send prescription Xeloda 1500mg  bid (2 week on, 1 week off) to [place] today, plan to start after her next visit with me  -IVF with NS 1L today -follow up with [facility] in 3 days with IVF -CT CAP next week and I will see her back after CT   All questions were answered. The patient knows to call the clinic with any problems, questions or concerns.  I spent 30 minutes counseling the patient face to face. The total time spent in the appointment was 40 minutes and more than 50% was on counseling.    [name], MD 07/09/2015

## 2015-07-09 NOTE — Telephone Encounter (Signed)
Daughter called with concerns about mothers condition. Pt is not eating or drinking. She is having difficulty staying awake to have conversations, eat or drink. Pt has new abd pain complaints. She has also complained about rash returning. Sent POF for lab and [facility] at 1030/11

## 2015-07-09 NOTE — Patient Instructions (Signed)

## 2015-07-09 NOTE — Telephone Encounter (Signed)
Gave and printed appt sched and avs for pt for March. °

## 2015-07-10 ENCOUNTER — Other Ambulatory Visit: Payer: Medicare Other

## 2015-07-10 ENCOUNTER — Ambulatory Visit: Payer: Medicare Other

## 2015-07-10 ENCOUNTER — Ambulatory Visit: Payer: Medicare Other | Admitting: Hematology

## 2015-07-12 ENCOUNTER — Telehealth: Payer: Self-pay | Admitting: *Deleted

## 2015-07-12 ENCOUNTER — Other Ambulatory Visit: Payer: Medicare Other

## 2015-07-12 ENCOUNTER — Ambulatory Visit: Payer: Medicare Other | Admitting: Nurse Practitioner

## 2015-07-12 ENCOUNTER — Telehealth: Payer: Self-pay | Admitting: Hematology

## 2015-07-12 NOTE — Telephone Encounter (Signed)
returned call and cx appt per pt.

## 2015-07-12 NOTE — Telephone Encounter (Signed)
Called pt to see how she was doing since she cancelled her appt for today.  She reports that she felt good yest & today & rash is gone & she didn't feel the need to come in.  Reminded pt of app 07/19/15.  She referred to the chemo & stated it was poison & she thinks she is allergic to it.

## 2015-07-17 ENCOUNTER — Ambulatory Visit: Payer: Medicare Other

## 2015-07-17 ENCOUNTER — Ambulatory Visit (HOSPITAL_COMMUNITY)
Admission: RE | Admit: 2015-07-17 | Discharge: 2015-07-17 | Disposition: A | Discharge status: Home / Self Care | Payer: Medicare Other | Source: Ambulatory Visit | Attending: Hematology | Admitting: Hematology

## 2015-07-17 ENCOUNTER — Encounter (HOSPITAL_COMMUNITY): Payer: Self-pay

## 2015-07-17 ENCOUNTER — Other Ambulatory Visit: Payer: Medicare Other

## 2015-07-17 DIAGNOSIS — N281 Cyst of kidney, acquired: Secondary | ICD-10-CM | POA: Diagnosis not present

## 2015-07-17 DIAGNOSIS — K869 Disease of pancreas, unspecified: Secondary | ICD-10-CM | POA: Diagnosis not present

## 2015-07-17 DIAGNOSIS — K7689 Other specified diseases of liver: Secondary | ICD-10-CM | POA: Insufficient documentation

## 2015-07-17 DIAGNOSIS — K5730 Diverticulosis of large intestine without perforation or abscess without bleeding: Secondary | ICD-10-CM | POA: Diagnosis not present

## 2015-07-17 DIAGNOSIS — C250 Malignant neoplasm of head of pancreas: Secondary | ICD-10-CM | POA: Insufficient documentation

## 2015-07-17 MED ORDER — IOHEXOL 300 MG/ML  SOLN
100.0000 mL | Freq: Once | INTRAMUSCULAR | Status: AC | PRN
  Administered 2015-07-17: 100 mL via INTRAVENOUS

## 2015-07-19 ENCOUNTER — Other Ambulatory Visit: Payer: Medicare Other

## 2015-07-19 ENCOUNTER — Ambulatory Visit (HOSPITAL_BASED_OUTPATIENT_CLINIC_OR_DEPARTMENT_OTHER): Payer: Medicare Other | Admitting: Hematology

## 2015-07-19 ENCOUNTER — Encounter: Payer: Self-pay | Admitting: Hematology

## 2015-07-19 ENCOUNTER — Telehealth: Payer: Self-pay | Admitting: Hematology

## 2015-07-19 ENCOUNTER — Other Ambulatory Visit (HOSPITAL_BASED_OUTPATIENT_CLINIC_OR_DEPARTMENT_OTHER): Payer: Medicare Other

## 2015-07-19 VITALS — BP 113/58 | HR 83 | Temp 97.6°F | Resp 18 | Ht 66.5 in | Wt 145.2 lb

## 2015-07-19 DIAGNOSIS — E039 Hypothyroidism, unspecified: Secondary | ICD-10-CM | POA: Diagnosis not present

## 2015-07-19 DIAGNOSIS — I10 Essential (primary) hypertension: Secondary | ICD-10-CM | POA: Insufficient documentation

## 2015-07-19 DIAGNOSIS — E119 Type 2 diabetes mellitus without complications: Secondary | ICD-10-CM | POA: Insufficient documentation

## 2015-07-19 DIAGNOSIS — I824Z2 Acute embolism and thrombosis of unspecified deep veins of left distal lower extremity: Secondary | ICD-10-CM

## 2015-07-19 DIAGNOSIS — C251 Malignant neoplasm of body of pancreas: Secondary | ICD-10-CM

## 2015-07-19 DIAGNOSIS — C250 Malignant neoplasm of head of pancreas: Secondary | ICD-10-CM

## 2015-07-19 DIAGNOSIS — C258 Malignant neoplasm of overlapping sites of pancreas: Secondary | ICD-10-CM | POA: Diagnosis not present

## 2015-07-19 DIAGNOSIS — R21 Rash and other nonspecific skin eruption: Secondary | ICD-10-CM

## 2015-07-19 LAB — CBC WITH DIFFERENTIAL/PLATELET
BASO%: 1.1 % (ref 0.0–2.0)
Basophils Absolute: 0.1 10*3/uL (ref 0.0–0.1)
EOS%: 1.1 % (ref 0.0–7.0)
Eosinophils Absolute: 0.1 10*3/uL (ref 0.0–0.5)
HCT: 28.7 % — ABNORMAL LOW (ref 34.8–46.6)
HGB: 9.1 g/dL — ABNORMAL LOW (ref 11.6–15.9)
LYMPH%: 35.1 % (ref 14.0–49.7)
MCH: 30 pg (ref 25.1–34.0)
MCHC: 31.7 g/dL (ref 31.5–36.0)
MCV: 94.7 fL (ref 79.5–101.0)
MONO#: 1.1 10*3/uL — ABNORMAL HIGH (ref 0.1–0.9)
MONO%: 13 % (ref 0.0–14.0)
NEUT#: 4.1 10*3/uL (ref 1.5–6.5)
NEUT%: 49.7 % (ref 38.4–76.8)
Platelets: 241 10*3/uL (ref 145–400)
RBC: 3.03 10*6/uL — AB (ref 3.70–5.45)
RDW: 18.1 % — ABNORMAL HIGH (ref 11.2–14.5)
WBC: 8.2 10*3/uL (ref 3.9–10.3)
lymph#: 2.9 10*3/uL (ref 0.9–3.3)

## 2015-07-19 LAB — COMPREHENSIVE METABOLIC PANEL
ALT: 10 U/L (ref 0–55)
AST: 12 U/L (ref 5–34)
Albumin: 2.9 g/dL — ABNORMAL LOW (ref 3.5–5.0)
Alkaline Phosphatase: 69 U/L (ref 40–150)
Anion Gap: 13 mEq/L — ABNORMAL HIGH (ref 3–11)
BUN: 16.1 mg/dL (ref 7.0–26.0)
CO2: 21 meq/L — AB (ref 22–29)
Calcium: 9 mg/dL (ref 8.4–10.4)
Chloride: 108 mEq/L (ref 98–109)
Creatinine: 0.9 mg/dL (ref 0.6–1.1)
EGFR: 61 mL/min/{1.73_m2} — AB (ref >90)
GLUCOSE: 130 mg/dL (ref 70–140)
POTASSIUM: 3.7 meq/L (ref 3.5–5.1)
SODIUM: 142 meq/L (ref 136–145)
Total Bilirubin: <0.3 mg/dL (ref 0.20–1.20)
Total Protein: 6.4 g/dL (ref 6.4–8.3)

## 2015-07-19 LAB — PROTIME-INR
INR: 4.7 — ABNORMAL HIGH (ref 2.00–3.50)
Protime: 56.4 Seconds — ABNORMAL HIGH (ref 10.6–13.4)

## 2015-07-19 MED ORDER — WARFARIN SODIUM 3 MG PO TABS: 3.0000 mg | ORAL_TABLET | Freq: Every day | ORAL | Status: DC

## 2015-07-19 NOTE — Progress Notes (Signed)
[name]  [phone] [phone]  Clinic Follow Up Note   Patient Care Team: Nicole Fess, MD as PCP - General (Family Medicine) Nicole Merle, MD as Consulting Physician (Hematology) Nicole Silence, MD as Consulting Physician (Gastroenterology) 07/19/2015  CHIEF COMPLAINTS:  Follow up unresectable pancreatic adenocarcinoma    OTHER RELATED ISSUES 1. Left LE DVT diagnosed on 06/09/2015, started on lovenox and bridged to coumadin   Oncology History   Pancreatic cancer [facility])   Staging form: Pancreas, AJCC 7th Edition     Clinical stage from 05/27/2014: Stage IIB (T2, N1, M0) - Signed by Nicole Merle, MD on 05/31/2015       Pancreatic cancer ([place])   05/16/2015 Imaging CT chest, abdomen and pelvis with contrast showed a bulky mass at pancreatic neck/body, tumor encases the celiac and proximal branches and a cruise the main portal vein, peripancreatic adenopathy. no distant mets.    05/28/2015 Initial Biopsy Pancreatic mass needle biopsy showed well differentiated adenocarcinoma   05/31/2015 Initial Diagnosis Pancreatic cancer ([place])   05/31/2015 Tumor Marker CA19.9 =375   06/05/2015 -  Chemotherapy weekly gemcitabine 1000mg /m2, dose reduced to 850mg /m2 on week 2 due to tolerance issue    HISTORY OF PRESENTING ILLNESS:  Nicole Sherman 82 y.o. female is here because of her recent abnormal CT scan, which showed a pink vaginal mass, highly suspicious for pancreatic cancer.  She has been haivng epigastric pain after meal for 4-5 months. She also reports left side pain at night when she sleeps on left side, mild back pain, the pain is worse lately, lasts longer especially afte rdinner, it about 5/10, appetite is lower than before, she lost aobut 10-20lbs in the past 5 months. No other complains, she had loose BM 1-2 time BM for 8-10 months, no hematochezia or melena. She was evaluated by her primary care physician Dr. Rex Sherman, abdominal ultrasound was negative on  05/04/2015. She underwent a CT abdomen and pelvis with contrast on 05/16/2015, which showed a 4 x 1 cm mass in pancreatic body and a neck, tumor encases the distal celiac and proximal branches and occludes the main portal vein. No distant metastasis on the CT scan. She was referred to [place] for further workup.  She has good energy level, she is able to do all house work. She has intermittent dizziness from blood pressure meds. No other complains.   CURRENT THERAPY: weekly gemcitabine 1000 mg/m, dose reduced to 850 mg/m on week 2 due to tolerance issue  INTERIM HISTORY: Sure returns for follow-up. She is accompanied her husband and 2 daughters. She has recovered well  From chemotherapy.  She feels much better overall this week, has more energy, is eating well, no nausea, abdominal pain or other complaints.  His skin rash has reslved  MEDICAL HISTORY:  Past Medical History  Diagnosis Date  . Hypertension   . Hypothyroidism   . Diabetes mellitus without complication ([place])   . Arthritis     osteoarthritis-hands wrist  . Diverticulosis     showing on CT of abdomen 05-16-15  . Cancer [facility])     pancreatic mass- dx. adenocarcinoma" CT abdomen 05-16-15    SURGICAL HISTORY: Past Surgical History  Procedure Laterality Date  . Joint replacement Right   . Shoulder arthroscopy w/ rotator cuff repair Left   . Shoulder open rotator cuff repair Right   . Tubal ligation    . Appendectomy      open  . Ankle surgery Right   .  Eye surgery      macular hole surgery repair  . Cataract extraction Right   . Eus N/A 05/28/2015    Procedure: UPPER ENDOSCOPIC ULTRASOUND (EUS) LINEAR;  Surgeon: Nicole Silence, MD;  Location: [facility];  Service: Endoscopy;  Laterality: N/A;    SOCIAL HISTORY: Social History   Social History  . Marital Status: Married    Spouse Name: N/A  . Number of Children: 2 daughters   . Years of Education: N/A   Occupational History  . Not on file.   Social History Main  Topics  . Smoking status: Former Smoker -- 1.00 packs/day for 30 years    Types: Cigarettes    Quit date: 05/21/1986  . Smokeless tobacco: Not on file  . Alcohol Use: No  . Drug Use: No  . Sexual Activity: Not on file   Other Topics Concern  . Not on file   Social History Narrative    FAMILY HISTORY: Family History  Problem Relation Age of Onset  . Stroke Mother   . Cancer Sister     lung cancer     ALLERGIES:  is allergic to keflex and aspirin.  MEDICATIONS:  Current Outpatient Prescriptions  Medication Sig Dispense Refill  . levothyroxine (SYNTHROID, LEVOTHROID) 100 MCG tablet Take 100 mcg by mouth daily before breakfast.     . lisinopril (PRINIVIL,ZESTRIL) 20 MG tablet Take 20 mg by mouth daily.     . metFORMIN (GLUCOPHAGE) 1000 MG tablet Take 1,000 mg by mouth 2 (two) times daily with a meal.     . capecitabine (XELODA) 500 MG tablet Take 3 tablets (1,500 mg total) by mouth 2 (two) times daily after a meal. 2 weeks on and 1 week off (Patient not taking: Reported on 07/19/2015) 84 tablet 0  . omeprazole (PRILOSEC OTC) 20 MG tablet Take 20 mg by mouth daily as needed (acid reflux). Reported on 07/19/2015    . ondansetron (ZOFRAN) 8 MG tablet Take 1 tablet (8 mg total) by mouth 2 (two) times daily as needed (Nausea or vomiting). (Patient not taking: Reported on 07/19/2015) 30 tablet 1  . traMADol (ULTRAM) 50 MG tablet Take 1 tablet (50 mg total) by mouth every 6 (six) hours as needed. (Patient not taking: Reported on 07/19/2015) 30 tablet 0  . warfarin (COUMADIN) 3 MG tablet Take 1 tablet (3 mg total) by mouth daily. 30 tablet 1   No current facility-administered medications for this visit.    REVIEW OF SYSTEMS:   Constitutional: Denies fevers, chills or abnormal night sweats Eyes: Denies blurriness of vision, double vision or watery eyes Ears, nose, mouth, throat, and face: Denies mucositis or sore throat Respiratory: Denies cough, dyspnea or wheezes Cardiovascular: Denies  palpitation, chest discomfort or lower extremity swelling Gastrointestinal:  Denies nausea, heartburn or change in bowel habits Skin: Denies abnormal skin rashes Lymphatics: Denies new lymphadenopathy or easy bruising Neurological:Denies numbness, tingling or new weaknesses Behavioral/Psych: Mood is stable, no new changes  All other systems were reviewed with the patient and are negative.  PHYSICAL EXAMINATION: ECOG PERFORMANCE STATUS: 1-2  Filed Vitals:   07/19/15 1406  BP: 113/58  Pulse: 83  Temp: 97.6 F (36.4 C)  Resp: 18   Filed Weights   07/19/15 1406  Weight: 145 lb 3.2 oz (65.862 kg)    GENERAL:alert, no distress and comfortable SKIN: skin color, texture, turgor are normal, mild scatter skin rashes involving both breasts and upper agdomen EYES: normal, conjunctiva are pink and non-injected, sclera clear OROPHARYNX:no  exudate, no erythema and lips, buccal mucosa, and tongue normal  NECK: supple, thyroid normal size, non-tender, without nodularity LYMPH:  no palpable lymphadenopathy in the cervical, axillary or inguinal LUNGS: clear to auscultation and percussion with normal breathing effort HEART: regular rate & rhythm and no murmurs and no lower extremity edema ABDOMEN:abdomen soft, non-tender and normal bowel sounds Musculoskeletal:no cyanosis of digits and no clubbing  PSYCH: alert & oriented x 3 with fluent speech NEURO: no focal motor/sensory deficits  LABORATORY DATA:  I have reviewed the data as listed  CBC Latest Ref Rng 07/19/2015 07/09/2015 07/03/2015  WBC 3.9 - 10.3 10e3/uL 8.2 2.9(L) 6.2  Hemoglobin 11.6 - 15.9 g/dL 9.1(L) 9.4(L) 10.0(L)  Hematocrit 34.8 - 46.6 % 28.7(L) 28.8(L) 30.8(L)  Platelets 145 - 400 10e3/uL 241 383 199   CMP Latest Ref Rng 07/19/2015 07/09/2015 07/03/2015  Glucose 70 - 140 mg/dl 130 205(H) 283(H)  BUN 7.0 - 26.0 mg/dL 16.1 14.6 15.0  Creatinine 0.6 - 1.1 mg/dL 0.9 0.8 0.9  Sodium 136 - 145 mEq/L 142 134(L) 137  Potassium 3.5 - 5.1  mEq/L 3.7 4.1 3.9  CO2 22 - 29 mEq/L 21(L) 22 20(L)  Calcium 8.4 - 10.4 mg/dL 9.0 9.3 8.9  Total Protein 6.4 - 8.3 g/dL 6.4 6.4 6.2(L)  Total Bilirubin 0.20 - 1.20 mg/dL <0.30 0.39 0.31  Alkaline Phos 40 - 150 U/L 69 78 75  AST 5 - 34 U/L 12 14 9   ALT 0 - 55 U/L 10 11 11    CA19.9 u/ML 05/31/2015: 751 07/03/2015: 227  PATHOLOGY REPORT  Diagnosis 05/28/2015 FINE NEEDLE ASPIRATION: NEEDLE ASPIRATION, PANCREAS BODY (SPECIMEN 1 OF 1 COLLECTED 05/28/15): WELL DIFFERENTIATED ADENOCARCINOMA. Preliminary Diagnosis Intraoperative Diagnosis: Adequate (JDP)   RADIOGRAPHIC STUDIES: I have personally reviewed the radiological images as listed and agreed with the findings in the report. Ct Chest W Contrast  07/19/2015  ADDENDUM REPORT: 07/19/2015 14:39 ADDENDUM: Correction: The actual mass involving the pancreatic neck and body has slightly decreased in size compared with the prior study, now measuring 3.7 x 2.8 cm. There is adjacent persistent dilatation of the pancreatic duct in the pancreatic tail. There is persistent vascular encasement with chronic venous occlusion as described. Electronically Signed   By: Richardean Sale M.D.   On: 07/19/2015 14:39  07/19/2015  CLINICAL DATA:  Restaging pancreatic cancer diagnosed 2 months ago. Chemotherapy on hold. Previous appendectomy. EXAM: CT CHEST, ABDOMEN, AND PELVIS WITH CONTRAST TECHNIQUE: Multidetector CT imaging of the chest, abdomen and pelvis was performed following the standard protocol during bolus administration of intravenous contrast. CONTRAST:  136mL OMNIPAQUE IOHEXOL 300 MG/ML  SOLN COMPARISON:  Chest CT 05/30/2015.  Abdominal pelvic CT 05/16/2015. FINDINGS: CT CHEST Mediastinum/Nodes: There are no enlarged mediastinal, hilar or axillary lymph nodes. Minimal thyroid tissue noted. The heart size is normal. There is no pericardial effusion. Stable atherosclerosis of the aorta, great vessels and coronary arteries. Lungs/Pleura: There is no pleural  effusion. Moderate emphysema. The lungs are clear. There are no suspicious pulmonary nodules. Musculoskeletal/Chest wall: No chest wall mass or suspicious osseous findings. CT ABDOMEN AND PELVIS FINDINGS Hepatobiliary: No suspicious hepatic findings demonstrated. There are probable transient perfusion defects in the dome of the right hepatic lobe as well as more inferiorly. There is a stable 2.2 cm cyst inferiorly in the right hepatic lobe. Stable extrahepatic biliary dilatation. The gallbladder appears unremarkable without surrounding inflammation. Pancreas: Again demonstrated is a bulky hypovascular mass involving the pancreatic neck and body, measuring up to 4.8 x 2.8 x  2.3 cm. The pancreatic tail is atrophied. This mass encases the celiac trunk. There is chronic venous occlusion at the confluence of the splenic and superior mesenteric veins. There are multiple collateral vessels reconstitute in the portal vein. Spleen: Normal in size without focal abnormality. Adrenals/Urinary Tract: Both adrenal glands appear normal. There are no suspicious renal findings. There are renal cortical and parapelvic cysts. No hydronephrosis or urinary tract calculus. The bladder appears unremarkable. Stomach/Bowel: No evidence of bowel wall thickening, distention or surrounding inflammatory change. There are moderate diverticular changes of the sigmoid colon. Vascular/Lymphatic: There are no enlarged abdominal or pelvic lymph nodes. Stable aortic and branch vessel atherosclerosis. Reproductive: Unremarkable. Other: No ascites or peritoneal nodularity. There are several soft tissue nodules within the subcutaneous fat of the anterior abdominal wall, likely injection granulomas. Musculoskeletal: No acute or significant osseous findings. Mild lumbar spondylosis appears unchanged. IMPRESSION: 1. Stable chest CT without evidence of metastatic disease. 2. The bulky hypovascular mass involving the pancreatic neck and body has significantly  changed since the baseline study of 2 months ago. This lesion appears unresectable with arterial encasement. There is chronic venous occlusion with collaterals. 3. No discrete peripancreatic lymph nodes seen on the current examination. No distant metastases identified. 4. Stable extrahepatic biliary dilatation, bilateral renal cysts, hepatic cyst, colonic diverticulosis and atherosclerosis. Electronically Signed: By: Richardean Sale M.D. On: 07/17/2015 16:13   Ct Abdomen Pelvis W Contrast  07/19/2015  ADDENDUM REPORT: 07/19/2015 14:39 ADDENDUM: Correction: The actual mass involving the pancreatic neck and body has slightly decreased in size compared with the prior study, now measuring 3.7 x 2.8 cm. There is adjacent persistent dilatation of the pancreatic duct in the pancreatic tail. There is persistent vascular encasement with chronic venous occlusion as described. Electronically Signed   By: Richardean Sale M.D.   On: 07/19/2015 14:39  07/19/2015  CLINICAL DATA:  Restaging pancreatic cancer diagnosed 2 months ago. Chemotherapy on hold. Previous appendectomy. EXAM: CT CHEST, ABDOMEN, AND PELVIS WITH CONTRAST TECHNIQUE: Multidetector CT imaging of the chest, abdomen and pelvis was performed following the standard protocol during bolus administration of intravenous contrast. CONTRAST:  170mL OMNIPAQUE IOHEXOL 300 MG/ML  SOLN COMPARISON:  Chest CT 05/30/2015.  Abdominal pelvic CT 05/16/2015. FINDINGS: CT CHEST Mediastinum/Nodes: There are no enlarged mediastinal, hilar or axillary lymph nodes. Minimal thyroid tissue noted. The heart size is normal. There is no pericardial effusion. Stable atherosclerosis of the aorta, great vessels and coronary arteries. Lungs/Pleura: There is no pleural effusion. Moderate emphysema. The lungs are clear. There are no suspicious pulmonary nodules. Musculoskeletal/Chest wall: No chest wall mass or suspicious osseous findings. CT ABDOMEN AND PELVIS FINDINGS Hepatobiliary: No  suspicious hepatic findings demonstrated. There are probable transient perfusion defects in the dome of the right hepatic lobe as well as more inferiorly. There is a stable 2.2 cm cyst inferiorly in the right hepatic lobe. Stable extrahepatic biliary dilatation. The gallbladder appears unremarkable without surrounding inflammation. Pancreas: Again demonstrated is a bulky hypovascular mass involving the pancreatic neck and body, measuring up to 4.8 x 2.8 x 2.3 cm. The pancreatic tail is atrophied. This mass encases the celiac trunk. There is chronic venous occlusion at the confluence of the splenic and superior mesenteric veins. There are multiple collateral vessels reconstitute in the portal vein. Spleen: Normal in size without focal abnormality. Adrenals/Urinary Tract: Both adrenal glands appear normal. There are no suspicious renal findings. There are renal cortical and parapelvic cysts. No hydronephrosis or urinary tract calculus. The bladder appears unremarkable. Stomach/Bowel:  No evidence of bowel wall thickening, distention or surrounding inflammatory change. There are moderate diverticular changes of the sigmoid colon. Vascular/Lymphatic: There are no enlarged abdominal or pelvic lymph nodes. Stable aortic and branch vessel atherosclerosis. Reproductive: Unremarkable. Other: No ascites or peritoneal nodularity. There are several soft tissue nodules within the subcutaneous fat of the anterior abdominal wall, likely injection granulomas. Musculoskeletal: No acute or significant osseous findings. Mild lumbar spondylosis appears unchanged. IMPRESSION: 1. Stable chest CT without evidence of metastatic disease. 2. The bulky hypovascular mass involving the pancreatic neck and body has significantly changed since the baseline study of 2 months ago. This lesion appears unresectable with arterial encasement. There is chronic venous occlusion with collaterals. 3. No discrete peripancreatic lymph nodes seen on the  current examination. No distant metastases identified. 4. Stable extrahepatic biliary dilatation, bilateral renal cysts, hepatic cyst, colonic diverticulosis and atherosclerosis. Electronically Signed: By: Richardean Sale M.D. On: 07/17/2015 16:13    ASSESSMENT & PLAN:  82 year old Caucasian female, presented with intermittent abdominal pain, weight loss, and a CT finding of a pancreatic mass.  1. Pancreatic body/head adenocarcinoma, well differentiated, cT2N1M0, stage IIB, unresectable -I reviewed her CT chest results, which was negative for metastatic disease. -The biopsy results was reviewed with her and her family members in detail. -Her case was discussed in our GI tumor Board yesterday, Dr. Barry Dienes feels this is not resectable disease. -I reviewed the nature history of pancreatic cancer, which is aggressive. Her disease is incurable at this stage, and the goal of therapy is palliative and prolong her life. -I recommend palliative systemic chemotherapy  -she has been tolerating first line chemo gemcitabine poorly, with diarrhea, poor appetite, and skin rash, and she does not wish to continue. - I reviewed her restaging CT scan from 3/16, which showed stable pancreatic tumor, no liver or other metastasis  -Her tumor marker CA 19.9 has dropped significantly since she started chemotherapy,  Consistent with response to chemotherapy.  - I discussed second line therapy with 5-fu or Xelda.  Alternatively supportive care alone  Would be very reasonable given her poor tolerance to chemotherapy and advanced age -After a lengthy discussion with patient and her family members, under her daughte's encouragement, she agrees to try Xeloda -Chemotherapy consent: Side effects including but does not not limited to, fatigue, nausea, vomiting, diarrhea, hair loss, neuropathy, fluid retention, renal and kidney dysfunction, neutropenic fever, needed for blood transfusion, bleeding,  Coronary artery spasm and heart  attack,were discussed with patient in great detail. She agrees to proceed. - she does have high co-pay ($460) for Xeloda,   And unfortunately no financial assistance programs available -Due to the interaction of Xeloda and Coumadin, I'll decrease her Coumadin dose when she is on Xeloda and monitor her INR closely.  2. Left LE DVT -Probably provoked by her underlying malignancy and chemotherapy -I recommend anticoagulation indefinitely, giving her incurable malignancy, if no contraindications such as bleeding, occasions. -She is on coumadin 4mg  daily, INR 4.7 today, no bleeding  - I recommend her decease Coumadin to 3 mg daily, skip coumadin tonight  -Due to the interaction of Coumadin and Xeloda, I'll decrease her Coumadin to 2 mg daily when she is on Xeloda, and continue 3 mg daily when she is off Xeloda, and monitor her INR closely  3. Fatigue, nausea and diarrhea -secondary to chemo, she has recovered well -We reviewed the management of nausea and diarrhea with her and her family members again. -I encouraged her to have nutritional supplement, and  be physically as active as she can   4.HTN, DM, hypothyroidism  -follow up with PCP  -We will monitor her blood pressure and blood glucose closely during her therapy, which may be affected by dehydration, and steroids as pre-meds.   5. Skin rash -probably related to gemcitabine, previously responded to steroids -resolved now   Plan -stop gemcitabine  -she will try Xeloda, will start in one week. 1000mg  bid for the first week, if tolerates well, increase to 1500mg  bid for second week, then off for one week -see above coumadin dose adjustment   -RTC in 2 weeks with lab   All questions were answered. The patient knows to call the clinic with any problems, questions or concerns.  I spent 30 minutes counseling the patient face to face. The total time spent in the appointment was 40 minutes and more than 50% was on counseling.    Nicole Merle,  MD 07/19/2015

## 2015-07-19 NOTE — Telephone Encounter (Signed)
gave copy of avs

## 2015-07-20 ENCOUNTER — Encounter: Payer: Self-pay | Admitting: Pharmacist

## 2015-07-20 LAB — CANCER ANTIGEN 19-9: CA 19-9: 159 U/mL — ABNORMAL HIGH (ref 0–35)

## 2015-07-20 NOTE — Progress Notes (Signed)
Oral Chemotherapy Pharmacist Encounter   I spoke with patient for overview of new oral chemotherapy medication: Xeloda. Pt is doing well. The prescriptions have been sent to the [facility] for benefit analysis and approval. Rx approved through Medicare part B but copay is $460. No assistance available. Pt ok with copayment for now. Will try to continue to look for outside funding.   Counseled patient on administration, dosing, side effects, safe handling, and monitoring. Side effects include but not limited to: Diarrhea, hand foot syndrome, fatigue, mouth sores.  Nicole Sherman voiced understanding and appreciation.   All questions answered.  Will follow up in 1-2 weeks for adherence and toxicity management.   Thank you,  Montel Clock, PharmD, [facility]

## 2015-07-24 ENCOUNTER — Other Ambulatory Visit: Payer: Self-pay | Admitting: Hematology

## 2015-07-24 ENCOUNTER — Telehealth: Payer: Self-pay | Admitting: *Deleted

## 2015-07-24 NOTE — Telephone Encounter (Signed)
I called her back, and spoke with her daughter Jackelyn Poling, pt would like to postpone her chemo for a month, she wants to celebrate her 82th birthday on [birth year], then she will be ready to start afterwards. She has some constipation, I reviewed constipation management with Debbie.  I will change her next appointment to the week of 4/17. POF sent.   [name]  07/24/2015

## 2015-07-24 NOTE — Telephone Encounter (Signed)
Received vm call from Nicole Sherman stating that pt has changed her mind & wants to wait a couple of weeks to begin chemo pills.  Call back # [phone].  Message to Dr Burr Medico.

## 2015-07-25 ENCOUNTER — Telehealth: Payer: Self-pay | Admitting: *Deleted

## 2015-07-25 ENCOUNTER — Telehealth: Payer: Self-pay | Admitting: Hematology

## 2015-07-25 ENCOUNTER — Other Ambulatory Visit: Payer: Self-pay | Admitting: Hematology

## 2015-07-25 NOTE — Telephone Encounter (Signed)
"  I have a terrible problem with constipation.  No BM since Saturday so yesterday I took two stool softeners and three dulcolax.  My bowels emptied well with soft stool but my abdomen continues to be very sore.  Not passing any gas.  No nausea but no appetite.  If I eat, it hurts.  Trying to drink but not getting in 64 oz.  I am to begin Xeloda after August 23, 2015.  Return number [phone].

## 2015-07-25 NOTE — Telephone Encounter (Signed)
I called pt back. She had bad epigastric pain this morning, it got much better after she took 2 tylenol. She feels fine this afternoon. We reviewed constipation management, and I encourage her to take tramadol as needed, tylenol no more than 4 tab daily, and prolisec before breakfast. She voiced good uderstanding and knows to call us if her pain gets worse.   I will schedule her a lab appointment next week to check her INR, POF sent.   [name]  07/25/2015

## 2015-07-25 NOTE — Telephone Encounter (Signed)
Per 3/21 pof moved 3/30 lab/fu to week of 4/17. Spoke with patient re change and new appointment for lab/fu 4/20 @ 12:45 pm.

## 2015-07-26 ENCOUNTER — Telehealth: Payer: Self-pay | Admitting: Hematology

## 2015-07-26 NOTE — Telephone Encounter (Signed)
per pf to sch pt appt-cld & spoke to pt and gave pt time & date of appt

## 2015-07-30 ENCOUNTER — Encounter: Payer: Self-pay | Admitting: Hematology

## 2015-07-30 NOTE — Progress Notes (Signed)
Pt's daughter [name] called to inquire about [employer] card. I returned her phone call and gave her the number to [employer]. She will contact them to have them re-issue a card.She also had some questions concerning copays. I advised her that the patient is still responsible for specialist office visit copays and [employer] will cover the cost of the chemo drugs and leave her a balance of $5 and that they had been paying. [name] also wanted to know if [employer] would cover the chemo pill at the pharmacy. I advised her that [facility] would be able to answer specific questions about what is covered and how much she has remaining. [name] thanked me and said she would contact [employer].

## 2015-07-31 ENCOUNTER — Other Ambulatory Visit: Payer: Self-pay | Admitting: Hematology

## 2015-07-31 ENCOUNTER — Telehealth: Payer: Self-pay | Admitting: *Deleted

## 2015-07-31 MED ORDER — TRAMADOL HCL 50 MG PO TABS: 50.0000 mg | ORAL_TABLET | Freq: Four times a day (QID) | ORAL | Status: DC | PRN

## 2015-07-31 NOTE — Telephone Encounter (Signed)
Patient would like a refill on her Tramadol. Patient will be here on 08/02/15 to pick it up. Message forwarded to RN Thu.

## 2015-08-01 ENCOUNTER — Telehealth: Payer: Self-pay | Admitting: Pharmacist

## 2015-08-01 NOTE — Telephone Encounter (Signed)
ORAL CHEMOTHERAPY ENCOUNTER  Called patient for follow up on Xeloda. Left VM for her to call back and let us know how she is doing with this drug. Note:  Copay is high, no funds were available at time RX processed. May need to look for funds in the future.   Theone Murdoch, PharmD

## 2015-08-02 ENCOUNTER — Other Ambulatory Visit (HOSPITAL_BASED_OUTPATIENT_CLINIC_OR_DEPARTMENT_OTHER): Payer: Medicare Other

## 2015-08-02 ENCOUNTER — Ambulatory Visit: Payer: Medicare Other | Admitting: Hematology

## 2015-08-02 ENCOUNTER — Other Ambulatory Visit: Payer: Medicare Other

## 2015-08-02 ENCOUNTER — Other Ambulatory Visit: Payer: Self-pay | Admitting: Hematology

## 2015-08-02 DIAGNOSIS — C250 Malignant neoplasm of head of pancreas: Secondary | ICD-10-CM

## 2015-08-02 DIAGNOSIS — I824Z2 Acute embolism and thrombosis of unspecified deep veins of left distal lower extremity: Secondary | ICD-10-CM | POA: Diagnosis not present

## 2015-08-02 LAB — CBC WITH DIFFERENTIAL/PLATELET
BASO%: 0.1 % (ref 0.0–2.0)
BASOS ABS: 0 10*3/uL (ref 0.0–0.1)
EOS ABS: 0.2 10*3/uL (ref 0.0–0.5)
EOS%: 3.1 % (ref 0.0–7.0)
HEMATOCRIT: 31 % — AB (ref 34.8–46.6)
HGB: 9.8 g/dL — ABNORMAL LOW (ref 11.6–15.9)
LYMPH#: 1.9 10*3/uL (ref 0.9–3.3)
LYMPH%: 29.5 % (ref 14.0–49.7)
MCH: 29.9 pg (ref 25.1–34.0)
MCHC: 31.6 g/dL (ref 31.5–36.0)
MCV: 94.4 fL (ref 79.5–101.0)
MONO#: 0.6 10*3/uL (ref 0.1–0.9)
MONO%: 9.8 % (ref 0.0–14.0)
NEUT#: 3.8 10*3/uL (ref 1.5–6.5)
NEUT%: 57.5 % (ref 38.4–76.8)
PLATELETS: 249 10*3/uL (ref 145–400)
RBC: 3.29 10*6/uL — ABNORMAL LOW (ref 3.70–5.45)
RDW: 20.9 % — ABNORMAL HIGH (ref 11.2–14.5)
WBC: 6.6 10*3/uL (ref 3.9–10.3)

## 2015-08-02 LAB — COMPREHENSIVE METABOLIC PANEL
ALBUMIN: 3.1 g/dL — AB (ref 3.5–5.0)
ALK PHOS: 67 U/L (ref 40–150)
ALT: <9 U/L (ref 0–55)
AST: 12 U/L (ref 5–34)
Anion Gap: 10 mEq/L (ref 3–11)
BUN: 15.8 mg/dL (ref 7.0–26.0)
CO2: 24 mEq/L (ref 22–29)
Calcium: 9.5 mg/dL (ref 8.4–10.4)
Chloride: 107 mEq/L (ref 98–109)
Creatinine: 0.8 mg/dL (ref 0.6–1.1)
EGFR: 70 mL/min/{1.73_m2} — ABNORMAL LOW (ref >90)
GLUCOSE: 142 mg/dL — AB (ref 70–140)
POTASSIUM: 4.2 meq/L (ref 3.5–5.1)
SODIUM: 140 meq/L (ref 136–145)
TOTAL PROTEIN: 6.9 g/dL (ref 6.4–8.3)
Total Bilirubin: 0.37 mg/dL (ref 0.20–1.20)

## 2015-08-02 LAB — PROTIME-INR
INR: 3.1 (ref 2.00–3.50)
Protime: 37.2 Seconds — ABNORMAL HIGH (ref 10.6–13.4)

## 2015-08-02 MED ORDER — TRAMADOL HCL 50 MG PO TABS: 50.0000 mg | ORAL_TABLET | Freq: Four times a day (QID) | ORAL | Status: DC | PRN

## 2015-08-23 ENCOUNTER — Encounter: Payer: Self-pay | Admitting: Hematology

## 2015-08-23 ENCOUNTER — Telehealth: Payer: Self-pay | Admitting: Hematology

## 2015-08-23 ENCOUNTER — Ambulatory Visit (HOSPITAL_BASED_OUTPATIENT_CLINIC_OR_DEPARTMENT_OTHER): Payer: Medicare Other | Admitting: Hematology

## 2015-08-23 ENCOUNTER — Other Ambulatory Visit (HOSPITAL_BASED_OUTPATIENT_CLINIC_OR_DEPARTMENT_OTHER): Payer: Medicare Other

## 2015-08-23 VITALS — BP 121/62 | HR 89 | Temp 98.3°F | Resp 18 | Ht 66.5 in | Wt 137.2 lb

## 2015-08-23 DIAGNOSIS — E039 Hypothyroidism, unspecified: Secondary | ICD-10-CM

## 2015-08-23 DIAGNOSIS — E119 Type 2 diabetes mellitus without complications: Secondary | ICD-10-CM | POA: Diagnosis not present

## 2015-08-23 DIAGNOSIS — I824Z2 Acute embolism and thrombosis of unspecified deep veins of left distal lower extremity: Secondary | ICD-10-CM

## 2015-08-23 DIAGNOSIS — C250 Malignant neoplasm of head of pancreas: Secondary | ICD-10-CM

## 2015-08-23 DIAGNOSIS — C251 Malignant neoplasm of body of pancreas: Secondary | ICD-10-CM

## 2015-08-23 DIAGNOSIS — I10 Essential (primary) hypertension: Secondary | ICD-10-CM | POA: Diagnosis not present

## 2015-08-23 LAB — CBC WITH DIFFERENTIAL/PLATELET
BASO%: 1.3 % (ref 0.0–2.0)
Basophils Absolute: 0.1 10*3/uL (ref 0.0–0.1)
EOS%: 2.5 % (ref 0.0–7.0)
Eosinophils Absolute: 0.2 10*3/uL (ref 0.0–0.5)
HEMATOCRIT: 32.3 % — AB (ref 34.8–46.6)
HEMOGLOBIN: 10.2 g/dL — AB (ref 11.6–15.9)
LYMPH#: 2.6 10*3/uL (ref 0.9–3.3)
LYMPH%: 40.2 % (ref 14.0–49.7)
MCH: 29.6 pg (ref 25.1–34.0)
MCHC: 31.6 g/dL (ref 31.5–36.0)
MCV: 93.8 fL (ref 79.5–101.0)
MONO#: 0.7 10*3/uL (ref 0.1–0.9)
MONO%: 10.4 % (ref 0.0–14.0)
NEUT#: 2.9 10*3/uL (ref 1.5–6.5)
NEUT%: 45.6 % (ref 38.4–76.8)
Platelets: 273 10*3/uL (ref 145–400)
RBC: 3.44 10*6/uL — ABNORMAL LOW (ref 3.70–5.45)
RDW: 19.6 % — AB (ref 11.2–14.5)
WBC: 6.4 10*3/uL (ref 3.9–10.3)

## 2015-08-23 LAB — COMPREHENSIVE METABOLIC PANEL
AST: 13 U/L (ref 5–34)
Albumin: 3.2 g/dL — ABNORMAL LOW (ref 3.5–5.0)
Alkaline Phosphatase: 76 U/L (ref 40–150)
Anion Gap: 14 mEq/L — ABNORMAL HIGH (ref 3–11)
BUN: 13.8 mg/dL (ref 7.0–26.0)
CALCIUM: 9.8 mg/dL (ref 8.4–10.4)
CHLORIDE: 106 meq/L (ref 98–109)
CO2: 19 mEq/L — ABNORMAL LOW (ref 22–29)
CREATININE: 0.8 mg/dL (ref 0.6–1.1)
EGFR: 69 mL/min/{1.73_m2} — ABNORMAL LOW (ref >90)
Glucose: 136 mg/dl (ref 70–140)
Potassium: 4.4 mEq/L (ref 3.5–5.1)
Sodium: 139 mEq/L (ref 136–145)
Total Bilirubin: 0.37 mg/dL (ref 0.20–1.20)
Total Protein: 7.4 g/dL (ref 6.4–8.3)

## 2015-08-23 LAB — PROTIME-INR
INR: 4.6 — ABNORMAL HIGH (ref 2.00–3.50)
PROTIME: 55.2 s — AB (ref 10.6–13.4)

## 2015-08-23 MED ORDER — WARFARIN SODIUM 1 MG PO TABS: 1.0000 mg | ORAL_TABLET | Freq: Every day | ORAL | Status: DC

## 2015-08-23 MED ORDER — TRAMADOL HCL 50 MG PO TABS: 50.0000 mg | ORAL_TABLET | Freq: Four times a day (QID) | ORAL | Status: DC | PRN

## 2015-08-23 MED FILL — CAPECITABINE 500 MG TABLET: 500 | 21 days supply | Qty: 84 | Fill #0

## 2015-08-23 NOTE — Telephone Encounter (Signed)
Gave and printed appt sched and avs for pt for may °

## 2015-08-23 NOTE — Progress Notes (Signed)
Patient's daughter came during my lunch to inquire about other financial assistance for copays due to [facility] being out of funds for her mother's dx. Lenise assisted family with applying via telephone to [place]. When returning from lunch, I took over. I gave the NPI to the Cancer [facility] representative and that is all that was needed from myself. Daughter states she was informed that her mom would be approved based on the information she provided but they would receive a packet in the mail with instructions on what to return to [facility]. All of the detailed information was given directly to the daughter.

## 2015-08-23 NOTE — Progress Notes (Signed)
[place]  [phone] [phone]  Clinic Follow Up Note   Patient Care Team: Hulan Fess, MD as PCP - General (Family Medicine) [name], MD as Consulting Physician (Hematology) Arta Silence, MD as Consulting Physician (Gastroenterology) 08/23/2015  CHIEF COMPLAINTS:  Follow up unresectable pancreatic adenocarcinoma    OTHER RELATED ISSUES 1. Left LE DVT diagnosed on 06/09/2015, started on lovenox and bridged to coumadin   Oncology History   Pancreatic cancer [facility])   Staging form: Pancreas, AJCC 7th Edition     Clinical stage from 05/27/2014: Stage IIB (T2, N1, M0) - Signed by [name], MD on 05/31/2015       Pancreatic cancer ([place])   05/16/2015 Imaging CT chest, abdomen and pelvis with contrast showed a bulky mass at pancreatic neck/body, tumor encases the celiac and proximal branches and a cruise the main portal vein, peripancreatic adenopathy. no distant mets.    05/28/2015 Initial Biopsy Pancreatic mass needle biopsy showed well differentiated adenocarcinoma   05/31/2015 Initial Diagnosis Pancreatic cancer ([place])   05/31/2015 Tumor Marker CA19.9 =375   06/05/2015 -  Chemotherapy weekly gemcitabine 1000mg /m2, dose reduced to 850mg /m2 on week 2 due to tolerance issue    HISTORY OF PRESENTING ILLNESS:  Nicole Sherman 82 y.o. female is here because of her recent abnormal CT scan, which showed a pink vaginal mass, highly suspicious for pancreatic cancer.  She has been haivng epigastric pain after meal for 4-5 months. She also reports left side pain at night when she sleeps on left side, mild back pain, the pain is worse lately, lasts longer especially afte rdinner, it about 5/10, appetite is lower than before, she lost aobut 10-20lbs in the past 5 months. No other complains, she had loose BM 1-2 time BM for 8-10 months, no hematochezia or melena. She was evaluated by her primary care physician Dr. Rex Kras, abdominal ultrasound was negative on  05/04/2015. She underwent a CT abdomen and pelvis with contrast on 05/16/2015, which showed a 4 x 1 cm mass in pancreatic body and a neck, tumor encases the distal celiac and proximal branches and occludes the main portal vein. No distant metastasis on the CT scan. She was referred to [place] for further workup.  She has good energy level, she is able to do all house work. She has intermittent dizziness from blood pressure meds. No other complains.   CURRENT THERAPY: weekly gemcitabine 1000 mg/m, dose reduced to 850 mg/m on week 2 due to tolerance issue  INTERIM HISTORY: Sure returns for follow-up. She is accompanied her husband and 2 daughters. She all has felt better lately, but still has intermittent abdominal pain, especially after meal. She also reports metal taste change, and low appetite overall, although she does likely certain food more than others. Her energy level is moderate, she is able to take care of herself, and goes out to eat with her husband. No other new complains.   MEDICAL HISTORY:  Past Medical History  Diagnosis Date  . Hypertension   . Hypothyroidism   . Diabetes mellitus without complication ([place])   . Arthritis     osteoarthritis-hands wrist  . Diverticulosis     showing on CT of abdomen 05-16-15  . Cancer [facility])     pancreatic mass- dx. adenocarcinoma" CT abdomen 05-16-15    SURGICAL HISTORY: Past Surgical History  Procedure Laterality Date  . Joint replacement Right   . Shoulder arthroscopy w/ rotator cuff repair Left   . Shoulder open rotator cuff  repair Right   . Tubal ligation    . Appendectomy      open  . Ankle surgery Right   . Eye surgery      macular hole surgery repair  . Cataract extraction Right   . Eus N/A 05/28/2015    Procedure: UPPER ENDOSCOPIC ULTRASOUND (EUS) LINEAR;  Surgeon: Arta Silence, MD;  Location: [facility] ENDOSCOPY;  Service: Endoscopy;  Laterality: N/A;    SOCIAL HISTORY: Social History   Social History  . Marital Status: Married     Spouse Name: N/A  . Number of Children: 2 daughters   . Years of Education: N/A   Occupational History  . Not on file.   Social History Main Topics  . Smoking status: Former Smoker -- 1.00 packs/day for 30 years    Types: Cigarettes    Quit date: 05/21/1986  . Smokeless tobacco: Not on file  . Alcohol Use: No  . Drug Use: No  . Sexual Activity: Not on file   Other Topics Concern  . Not on file   Social History Narrative    FAMILY HISTORY: Family History  Problem Relation Age of Onset  . Stroke Mother   . Cancer Sister     lung cancer     ALLERGIES:  is allergic to keflex and aspirin.  MEDICATIONS:  Current Outpatient Prescriptions  Medication Sig Dispense Refill  . levothyroxine (SYNTHROID, LEVOTHROID) 100 MCG tablet Take 100 mcg by mouth daily before breakfast.     . lisinopril (PRINIVIL,ZESTRIL) 20 MG tablet Take 20 mg by mouth daily.     . metFORMIN (GLUCOPHAGE) 1000 MG tablet Take 1,000 mg by mouth 2 (two) times daily with a meal.     . omeprazole (PRILOSEC OTC) 20 MG tablet Take 20 mg by mouth daily as needed (acid reflux). Reported on 07/19/2015    . traMADol (ULTRAM) 50 MG tablet Take 1 tablet (50 mg total) by mouth every 6 (six) hours as needed. 60 tablet 0  . capecitabine (XELODA) 500 MG tablet Take 3 tablets (1,500 mg total) by mouth 2 (two) times daily after a meal. 2 weeks on and 1 week off (Patient not taking: Reported on 07/19/2015) 84 tablet 0  . ondansetron (ZOFRAN) 8 MG tablet Take 1 tablet (8 mg total) by mouth 2 (two) times daily as needed (Nausea or vomiting). (Patient not taking: Reported on 07/19/2015) 30 tablet 1  . warfarin (COUMADIN) 1 MG tablet Take 1 tablet (1 mg total) by mouth daily. 60 tablet 0   No current facility-administered medications for this visit.    REVIEW OF SYSTEMS:   Constitutional: Denies fevers, chills or abnormal night sweats Eyes: Denies blurriness of vision, double vision or watery eyes Ears, nose, mouth, throat, and  face: Denies mucositis or sore throat Respiratory: Denies cough, dyspnea or wheezes Cardiovascular: Denies palpitation, chest discomfort or lower extremity swelling Gastrointestinal:  Denies nausea, heartburn or change in bowel habits Skin: Denies abnormal skin rashes Lymphatics: Denies new lymphadenopathy or easy bruising Neurological:Denies numbness, tingling or new weaknesses Behavioral/Psych: Mood is stable, no new changes  All other systems were reviewed with the patient and are negative.  PHYSICAL EXAMINATION: ECOG PERFORMANCE STATUS: 1-2  Filed Vitals:   08/23/15 1315  BP: 121/62  Pulse: 89  Temp: 98.3 F (36.8 C)  Resp: 18   Filed Weights   08/23/15 1315  Weight: 137 lb 3.2 oz (62.234 kg)    GENERAL:alert, no distress and comfortable SKIN: skin color, texture, turgor are normal,  mild scatter skin rashes involving both breasts and upper agdomen EYES: normal, conjunctiva are pink and non-injected, sclera clear OROPHARYNX:no exudate, no erythema and lips, buccal mucosa, and tongue normal  NECK: supple, thyroid normal size, non-tender, without nodularity LYMPH:  no palpable lymphadenopathy in the cervical, axillary or inguinal LUNGS: clear to auscultation and percussion with normal breathing effort HEART: regular rate & rhythm and no murmurs and no lower extremity edema ABDOMEN:abdomen soft, non-tender and normal bowel sounds Musculoskeletal:no cyanosis of digits and no clubbing  PSYCH: alert & oriented x 3 with fluent speech NEURO: no focal motor/sensory deficits  LABORATORY DATA:  I have reviewed the data as listed  CBC Latest Ref Rng 08/23/2015 08/02/2015 07/19/2015  WBC 3.9 - 10.3 10e3/uL 6.4 6.6 8.2  Hemoglobin 11.6 - 15.9 g/dL 10.2(L) 9.8(L) 9.1(L)  Hematocrit 34.8 - 46.6 % 32.3(L) 31.0(L) 28.7(L)  Platelets 145 - 400 10e3/uL 273 249 241   CMP Latest Ref Rng 08/23/2015 08/02/2015 07/19/2015  Glucose 70 - 140 mg/dl 136 142(H) 130  BUN 7.0 - 26.0 mg/dL 13.8 15.8  16.1  Creatinine 0.6 - 1.1 mg/dL 0.8 0.8 0.9  Sodium 136 - 145 mEq/L 139 140 142  Potassium 3.5 - 5.1 mEq/L 4.4 4.2 3.7  CO2 22 - 29 mEq/L 19(L) 24 21(L)  Calcium 8.4 - 10.4 mg/dL 9.8 9.5 9.0  Total Protein 6.4 - 8.3 g/dL 7.4 6.9 6.4  Total Bilirubin 0.20 - 1.20 mg/dL 0.37 0.37 <0.30  Alkaline Phos 40 - 150 U/L 76 67 69  AST 5 - 34 U/L 13 12 12   ALT 0 - 55 U/L <9 <9 10   Results for AHTZIRI, ALBARRAN (MRN [id number]) as of 08/23/2015 14:44  Ref. Range 07/19/2015 13:28 08/02/2015 12:05 08/23/2015 12:42  Protime Latest Ref Range: 10.6-13.4 Seconds 56.4 (H) 37.2 (H) 55.2 (H)  INR Latest Ref Range: 2.00-3.50  4.70 (H) 3.10 4.60 (H)   CA19.9 u/ML 05/31/2015: 751 07/03/2015: 227 07/19/2015: 159  PATHOLOGY REPORT  Diagnosis 05/28/2015 FINE NEEDLE ASPIRATION: NEEDLE ASPIRATION, PANCREAS BODY (SPECIMEN 1 OF 1 COLLECTED 05/28/15): WELL DIFFERENTIATED ADENOCARCINOMA. Preliminary Diagnosis Intraoperative Diagnosis: Adequate (JDP)   RADIOGRAPHIC STUDIES: I have personally reviewed the radiological images as listed and agreed with the findings in the report. No results found.  ASSESSMENT & PLAN:  82 year old Caucasian female, presented with intermittent abdominal pain, weight loss, and a CT finding of a pancreatic mass.  1. Pancreatic body/head adenocarcinoma, well differentiated, cT2N1M0, stage IIB, unresectable -I reviewed her CT chest results, which was negative for metastatic disease. -The biopsy results was reviewed with her and her family members in detail. -Her case was discussed in our GI tumor Board yesterday, Dr. Barry Dienes feels this is not resectable disease. -I reviewed the nature history of pancreatic cancer, which is aggressive. Her disease is incurable at this stage, and the goal of therapy is palliative and prolong her life. -I recommend palliative systemic chemotherapy  -she has been tolerating first line chemo gemcitabine poorly, with diarrhea, poor appetite, and skin rash, and she  does not wish to continue. - I reviewed her restaging CT scan from 3/16, which showed stable pancreatic tumor, no liver or other metastasis  -Her tumor marker CA 19.9 has dropped significantly since she started chemotherapy,  Consistent with response to chemotherapy.  - I discussed second line therapy with 5-fu or Xelda.  Alternatively supportive care alone  Would be very reasonable given her poor tolerance to chemotherapy and advanced age -she has decided to try Xeloda, starting next Monday.  I'll start her her on 1000mg  bid, 2 weeks on, one-week off for the first cycle. - she does have high co-pay ($460) for Xeloda, and unfortunately no financial assistance programs available. I encouraged her to talk to our financial office to see if there are any other financial assistance available. -Due to the interaction of Xeloda and Coumadin, I'll decrease her Coumadin dose when she is on Xeloda and monitor her INR closely.  2. Left LE DVT -Probably provoked by her underlying malignancy and chemotherapy -I recommend anticoagulation indefinitely, giving her incurable malignancy, if no contraindications such as bleeding, occasions. -She is on coumadin 3mg  daily, INR 4.6 today, no bleeding  -Due to the interaction of Coumadin and Xeloda, I'll decrease her Coumadin to 1 mg daily when she is on Xeloda, and 2 mg daily when she is off Xeloda, and monitor her INR closely  3. Fatigue, anorexia, and abdominal pain -improved some, she'll continue tramadol as needed. I refilled for her today -I encouraged her to have nutritional supplement, and be physically as active as she can   4.HTN, DM, hypothyroidism  -follow up with PCP  -We will monitor her blood pressure and blood glucose closely during her therapy.   Plan -she will try Xeloda, will start on 4/24. 1000mg  bid for 2 weeks, then 1 week off for the first cycle. If she tolerates well, can consider increased to 1500 mg for the second cycle -see above coumadin  dose adjustment   -RTC on 5/1 for follow up and lab   All questions were answered. The patient knows to call the clinic with any problems, questions or concerns.  I spent 25 minutes counseling the patient face to face. The total time spent in the appointment was 30 minutes and more than 50% was on counseling.    [name], MD 08/23/2015

## 2015-08-24 LAB — CANCER ANTIGEN 19-9: CAN 19-9: 304 U/mL — AB (ref 0–35)

## 2015-08-27 ENCOUNTER — Encounter: Payer: Self-pay | Admitting: Pharmacist

## 2015-08-27 NOTE — Progress Notes (Signed)
Oral Chemotherapy Pharmacist Encounter   I spoke with patient for overview of new oral chemotherapy medication: Xeloda. Pt is doing well. The prescriptions have been sent to the [facility] for benefit analysis and approval. Pt picked up medication from [place] [facility]. No assistance currently available and copay $460. Pt ok with payment now but will continue to seek assistance. Pt started medication today on 4/24.   Here dose will be lower with cycle 1 with only 2 tablets twice daily for 14 days followed by 1 week off with plans to increase to 3 tablets twice daily if tolerated.   Re-Counseled patient on administration, dosing, side effects, safe handling, and monitoring. Side effects include but not limited to: diarrhea, fatigue, nausea, hand/foot syndrome, and mucositis.  Ms. Casale voiced understanding and appreciation.   All questions answered.  Will follow up with patient regarding assistance opportunities. Will follow up in 1-2 weeks for adherence and toxicity management.   Thank you,  Montel Clock, PharmD, [facility]

## 2015-09-03 ENCOUNTER — Telehealth: Payer: Self-pay | Admitting: Hematology

## 2015-09-03 ENCOUNTER — Ambulatory Visit (HOSPITAL_BASED_OUTPATIENT_CLINIC_OR_DEPARTMENT_OTHER): Payer: Medicare Other | Admitting: Hematology

## 2015-09-03 ENCOUNTER — Encounter: Payer: Self-pay | Admitting: Hematology

## 2015-09-03 ENCOUNTER — Other Ambulatory Visit (HOSPITAL_BASED_OUTPATIENT_CLINIC_OR_DEPARTMENT_OTHER): Payer: Medicare Other

## 2015-09-03 VITALS — BP 114/64 | HR 88 | Temp 98.0°F | Resp 18 | Ht 66.5 in | Wt 134.3 lb

## 2015-09-03 DIAGNOSIS — I10 Essential (primary) hypertension: Secondary | ICD-10-CM | POA: Diagnosis not present

## 2015-09-03 DIAGNOSIS — I824Z2 Acute embolism and thrombosis of unspecified deep veins of left distal lower extremity: Secondary | ICD-10-CM | POA: Diagnosis not present

## 2015-09-03 DIAGNOSIS — C251 Malignant neoplasm of body of pancreas: Secondary | ICD-10-CM

## 2015-09-03 DIAGNOSIS — C250 Malignant neoplasm of head of pancreas: Secondary | ICD-10-CM | POA: Diagnosis not present

## 2015-09-03 DIAGNOSIS — E039 Hypothyroidism, unspecified: Secondary | ICD-10-CM | POA: Diagnosis not present

## 2015-09-03 DIAGNOSIS — E119 Type 2 diabetes mellitus without complications: Secondary | ICD-10-CM

## 2015-09-03 LAB — CBC WITH DIFFERENTIAL/PLATELET
BASO%: 0.9 % (ref 0.0–2.0)
Basophils Absolute: 0.1 10*3/uL (ref 0.0–0.1)
EOS%: 3.9 % (ref 0.0–7.0)
Eosinophils Absolute: 0.2 10*3/uL (ref 0.0–0.5)
HEMATOCRIT: 31.8 % — AB (ref 34.8–46.6)
HEMOGLOBIN: 10 g/dL — AB (ref 11.6–15.9)
LYMPH#: 2.4 10*3/uL (ref 0.9–3.3)
LYMPH%: 42.7 % (ref 14.0–49.7)
MCH: 29 pg (ref 25.1–34.0)
MCHC: 31.4 g/dL — AB (ref 31.5–36.0)
MCV: 92.4 fL (ref 79.5–101.0)
MONO#: 0.5 10*3/uL (ref 0.1–0.9)
MONO%: 9.5 % (ref 0.0–14.0)
NEUT%: 43 % (ref 38.4–76.8)
NEUTROS ABS: 2.5 10*3/uL (ref 1.5–6.5)
PLATELETS: 227 10*3/uL (ref 145–400)
RBC: 3.44 10*6/uL — ABNORMAL LOW (ref 3.70–5.45)
RDW: 20 % — ABNORMAL HIGH (ref 11.2–14.5)
WBC: 5.7 10*3/uL (ref 3.9–10.3)

## 2015-09-03 LAB — COMPREHENSIVE METABOLIC PANEL
ALBUMIN: 3.2 g/dL — AB (ref 3.5–5.0)
ALK PHOS: 67 U/L (ref 40–150)
ANION GAP: 11 meq/L (ref 3–11)
AST: 13 U/L (ref 5–34)
BILIRUBIN TOTAL: 0.42 mg/dL (ref 0.20–1.20)
BUN: 13.4 mg/dL (ref 7.0–26.0)
CALCIUM: 9.5 mg/dL (ref 8.4–10.4)
CO2: 22 mEq/L (ref 22–29)
CREATININE: 0.8 mg/dL (ref 0.6–1.1)
Chloride: 106 mEq/L (ref 98–109)
EGFR: 67 mL/min/{1.73_m2} — ABNORMAL LOW (ref >90)
Glucose: 147 mg/dl — ABNORMAL HIGH (ref 70–140)
Potassium: 3.7 mEq/L (ref 3.5–5.1)
Sodium: 138 mEq/L (ref 136–145)
TOTAL PROTEIN: 6.8 g/dL (ref 6.4–8.3)

## 2015-09-03 LAB — PROTIME-INR
INR: 1.6 — AB (ref 2.00–3.50)
PROTIME: 19.2 s — AB (ref 10.6–13.4)

## 2015-09-03 NOTE — Progress Notes (Signed)
[place]  [phone] [phone]  Clinic Follow Up Note   Patient Care Team: Hulan Fess, MD as PCP - General (Family Medicine) [name], MD as Consulting Physician (Hematology) Arta Silence, MD as Consulting Physician (Gastroenterology) 09/03/2015  CHIEF COMPLAINTS:  Follow up unresectable pancreatic adenocarcinoma    OTHER RELATED ISSUES 1. Left LE DVT diagnosed on 06/09/2015, started on lovenox and bridged to coumadin   Oncology History   Pancreatic cancer [facility])   Staging form: Pancreas, AJCC 7th Edition     Clinical stage from 05/27/2014: Stage IIB (T2, N1, M0) - Signed by [name], MD on 05/31/2015       Pancreatic cancer ([place])   05/16/2015 Imaging CT chest, abdomen and pelvis with contrast showed a bulky mass at pancreatic neck/body, tumor encases the celiac and proximal branches and a cruise the main portal vein, peripancreatic adenopathy. no distant mets.    05/28/2015 Initial Biopsy Pancreatic mass needle biopsy showed well differentiated adenocarcinoma   05/31/2015 Initial Diagnosis Pancreatic cancer ([place])   05/31/2015 Tumor Marker CA19.9 =375   06/05/2015 - 07/03/2015 Chemotherapy weekly gemcitabine 1000mg /m2, dose reduced to 850mg /m2 on week 2 due to tolerance issue   08/27/2015 -  Chemotherapy xeloda 1000mg  bid, 14 days on, 7 days off    HISTORY OF PRESENTING ILLNESS:  Nicole Sherman 82 y.o. female is here because of her recent abnormal CT scan, which showed a pink vaginal mass, highly suspicious for pancreatic cancer.  She has been haivng epigastric pain after meal for 4-5 months. She also reports left side pain at night when she sleeps on left side, mild back pain, the pain is worse lately, lasts longer especially afte rdinner, it about 5/10, appetite is lower than before, she lost aobut 10-20lbs in the past 5 months. No other complains, she had loose BM 1-2 time BM for 8-10 months, no hematochezia or melena. She was evaluated by her  primary care physician Dr. Rex Kras, abdominal ultrasound was negative on 05/04/2015. She underwent a CT abdomen and pelvis with contrast on 05/16/2015, which showed a 4 x 1 cm mass in pancreatic body and a neck, tumor encases the distal celiac and proximal branches and occludes the main portal vein. No distant metastasis on the CT scan. She was referred to [place] for further workup.  She has good energy level, she is able to do all house work. She has intermittent dizziness from blood pressure meds. No other complains.   CURRENT THERAPY: Xeloda 1000mg  bid, 14 days on, 7 days off, started on 08/27/2015  INTERIM HISTORY: Cailee returns for follow-up. She is accompanied her husband and 2 daughters. She started xeloda one week ago, tolerating moderately well overall, main complain is fatigue, she feels her energy is 50% down since she started her chemo pill. She has mild intermittent abdominal pain, she takes tramadol 2-3 tab a day. Her appetite is low to moderate, she has lost about 4 lbs in the past few weeks.    MEDICAL HISTORY:  Past Medical History  Diagnosis Date  . Hypertension   . Hypothyroidism   . Diabetes mellitus without complication ([place])   . Arthritis     osteoarthritis-hands wrist  . Diverticulosis     showing on CT of abdomen 05-16-15  . Cancer [facility])     pancreatic mass- dx. adenocarcinoma" CT abdomen 05-16-15    SURGICAL HISTORY: Past Surgical History  Procedure Laterality Date  . Joint replacement Right   . Shoulder arthroscopy w/ rotator  cuff repair Left   . Shoulder open rotator cuff repair Right   . Tubal ligation    . Appendectomy      open  . Ankle surgery Right   . Eye surgery      macular hole surgery repair  . Cataract extraction Right   . Eus N/A 05/28/2015    Procedure: UPPER ENDOSCOPIC ULTRASOUND (EUS) LINEAR;  Surgeon: Arta Silence, MD;  Location: [facility] ENDOSCOPY;  Service: Endoscopy;  Laterality: N/A;    SOCIAL HISTORY: Social History   Social History  .  Marital Status: Married    Spouse Name: N/A  . Number of Children: 2 daughters   . Years of Education: N/A   Occupational History  . Not on file.   Social History Main Topics  . Smoking status: Former Smoker -- 1.00 packs/day for 30 years    Types: Cigarettes    Quit date: 05/21/1986  . Smokeless tobacco: Not on file  . Alcohol Use: No  . Drug Use: No  . Sexual Activity: Not on file   Other Topics Concern  . Not on file   Social History Narrative    FAMILY HISTORY: Family History  Problem Relation Age of Onset  . Stroke Mother   . Cancer Sister     lung cancer     ALLERGIES:  is allergic to keflex and aspirin.  MEDICATIONS:  Current Outpatient Prescriptions  Medication Sig Dispense Refill  . capecitabine (XELODA) 500 MG tablet Take 3 tablets (1,500 mg total) by mouth 2 (two) times daily after a meal. 2 weeks on and 1 week off 84 tablet 0  . levothyroxine (SYNTHROID, LEVOTHROID) 100 MCG tablet Take 100 mcg by mouth daily before breakfast.     . lisinopril (PRINIVIL,ZESTRIL) 20 MG tablet Take 20 mg by mouth daily.     . metFORMIN (GLUCOPHAGE) 1000 MG tablet Take 1,000 mg by mouth 2 (two) times daily with a meal.     . omeprazole (PRILOSEC OTC) 20 MG tablet Take 20 mg by mouth daily as needed (acid reflux). Reported on 07/19/2015    . ondansetron (ZOFRAN) 8 MG tablet Take 1 tablet (8 mg total) by mouth 2 (two) times daily as needed (Nausea or vomiting). 30 tablet 1  . traMADol (ULTRAM) 50 MG tablet Take 1 tablet (50 mg total) by mouth every 6 (six) hours as needed. 60 tablet 0  . warfarin (COUMADIN) 1 MG tablet Take 1 tablet (1 mg total) by mouth daily. 60 tablet 0   No current facility-administered medications for this visit.    REVIEW OF SYSTEMS:   Constitutional: Denies fevers, chills or abnormal night sweats Eyes: Denies blurriness of vision, double vision or watery eyes Ears, nose, mouth, throat, and face: Denies mucositis or sore throat Respiratory: Denies cough,  dyspnea or wheezes Cardiovascular: Denies palpitation, chest discomfort or lower extremity swelling Gastrointestinal:  Denies nausea, heartburn or change in bowel habits Skin: Denies abnormal skin rashes Lymphatics: Denies new lymphadenopathy or easy bruising Neurological:Denies numbness, tingling or new weaknesses Behavioral/Psych: Mood is stable, no new changes  All other systems were reviewed with the patient and are negative.  PHYSICAL EXAMINATION: ECOG PERFORMANCE STATUS: 1-2  Filed Vitals:   09/03/15 1421  BP: 114/64  Pulse: 88  Temp: 98 F (36.7 C)  Resp: 18   Filed Weights   09/03/15 1421  Weight: 134 lb 4.8 oz (60.918 kg)    GENERAL:alert, no distress and comfortable SKIN: skin color, texture, turgor are normal, mild scatter  skin rashes involving both breasts and upper agdomen EYES: normal, conjunctiva are pink and non-injected, sclera clear OROPHARYNX:no exudate, no erythema and lips, buccal mucosa, and tongue normal  NECK: supple, thyroid normal size, non-tender, without nodularity LYMPH:  no palpable lymphadenopathy in the cervical, axillary or inguinal LUNGS: clear to auscultation and percussion with normal breathing effort HEART: regular rate & rhythm and no murmurs and no lower extremity edema ABDOMEN:abdomen soft, non-tender and normal bowel sounds Musculoskeletal:no cyanosis of digits and no clubbing  PSYCH: alert & oriented x 3 with fluent speech NEURO: no focal motor/sensory deficits  LABORATORY DATA:  I have reviewed the data as listed  CBC Latest Ref Rng 09/03/2015 08/23/2015 08/02/2015  WBC 3.9 - 10.3 10e3/uL 5.7 6.4 6.6  Hemoglobin 11.6 - 15.9 g/dL 10.0(L) 10.2(L) 9.8(L)  Hematocrit 34.8 - 46.6 % 31.8(L) 32.3(L) 31.0(L)  Platelets 145 - 400 10e3/uL 227 273 249   CMP Latest Ref Rng 09/03/2015 08/23/2015 08/02/2015  Glucose 70 - 140 mg/dl 147(H) 136 142(H)  BUN 7.0 - 26.0 mg/dL 13.4 13.8 15.8  Creatinine 0.6 - 1.1 mg/dL 0.8 0.8 0.8  Sodium 136 - 145  mEq/L 138 139 140  Potassium 3.5 - 5.1 mEq/L 3.7 4.4 4.2  CO2 22 - 29 mEq/L 22 19(L) 24  Calcium 8.4 - 10.4 mg/dL 9.5 9.8 9.5  Total Protein 6.4 - 8.3 g/dL 6.8 7.4 6.9  Total Bilirubin 0.20 - 1.20 mg/dL 0.42 0.37 0.37  Alkaline Phos 40 - 150 U/L 67 76 67  AST 5 - 34 U/L 13 13 12   ALT 0 - 55 U/L <9 <9 <9   PT/INR today: 19.2/1.6   CA19.9 u/ML 05/31/2015: 751 07/03/2015: 227 07/19/2015: 159 08/23/2015: 304  PATHOLOGY REPORT  Diagnosis 05/28/2015 FINE NEEDLE ASPIRATION: NEEDLE ASPIRATION, PANCREAS BODY (SPECIMEN 1 OF 1 COLLECTED 05/28/15): WELL DIFFERENTIATED ADENOCARCINOMA. Preliminary Diagnosis Intraoperative Diagnosis: Adequate ([facility])   RADIOGRAPHIC STUDIES: I have personally reviewed the radiological images as listed and agreed with the findings in the report. No results found.  ASSESSMENT & PLAN:  82 year old Caucasian female, presented with intermittent abdominal pain, weight loss, and a CT finding of a pancreatic mass.  1. Pancreatic body/head adenocarcinoma, well differentiated, cT2N1M0, stage IIB, unresectable -I reviewed her CT chest results, which was negative for metastatic disease. -The biopsy results was reviewed with her and her family members in detail. -Her case was discussed in our GI tumor Board yesterday, Dr. Barry Dienes feels this is not resectable disease. -I reviewed the nature history of pancreatic cancer, which is aggressive. Her disease is incurable at this stage, and the goal of therapy is palliative and prolong her life. -I recommend palliative systemic chemotherapy  -she has been tolerating first line chemo gemcitabine poorly, with diarrhea, poor appetite, and skin rash, and she does not wish to continue. - I reviewed her restaging CT scan from 3/16, which showed stable pancreatic tumor, no liver or other metastasis  -Her tumor marker CA 19.9 has dropped significantly since she started chemotherapy,  Consistent with response to chemotherapy.  - I discussed  second line therapy with 5-fu or Xelda.  Alternatively supportive care alone  Would be very reasonable given her poor tolerance to chemotherapy and advanced age -she has decided to try Xeloda, currently on first cycle, was main side effect of fatigue. She will continue -She will be receiving financial assistance for her Xeloda co-pay from next cycle -I'll see her back in about 2 weeks before she starts cycle 2.  2. Left LE DVT -Probably  provoked by her underlying malignancy and chemotherapy -I recommend anticoagulation indefinitely, giving her incurable malignancy, if no contraindications such as bleeding, occasions. -I have decreased her Coumadin dose due to her supratherapeutic INR 2 weeks ago, and potential interaction of Coumadin and Xeloda. -I'll change her code been dosed to the following all of when she is on Xeloda, Coumadin 1 mg on MWF, and 2 mg on the rest of week. When she is off Xeloda, she will take Coumadin 2 mg on MWF, and 3 mg on the rest of week    3. Fatigue, anorexia, and abdominal pain -improved some, she'll continue tramadol as needed.  -I encouraged her to have nutritional supplement, and be physically as active as she can  -I encouraged her to follow-up with our dietitian palpable, she declined at this point  4.HTN, DM, hypothyroidism  -follow up with PCP  -We will monitor her blood pressure and blood glucose closely during her therapy.   Plan -continue Xeloda, she will finish this cycle in 6 days, then have one week off -see above coumadin dose adjustment   -RTC on 5/12 for follow up and lab, before next cycle xeloda on 5/15   All questions were answered. The patient knows to call the clinic with any problems, questions or concerns.  I spent 25 minutes counseling the patient face to face. The total time spent in the appointment was 30 minutes and more than 50% was on counseling.    [name], MD 09/03/2015

## 2015-09-03 NOTE — Telephone Encounter (Signed)
per pof to sch pt appt-gave pt copy of avs °

## 2015-09-04 LAB — CANCER ANTIGEN 19-9: CAN 19-9: 309 U/mL — AB (ref 0–35)

## 2015-09-14 ENCOUNTER — Telehealth: Payer: Self-pay | Admitting: Hematology

## 2015-09-14 ENCOUNTER — Ambulatory Visit (HOSPITAL_BASED_OUTPATIENT_CLINIC_OR_DEPARTMENT_OTHER): Payer: Medicare Other | Admitting: Hematology

## 2015-09-14 ENCOUNTER — Encounter: Payer: Self-pay | Admitting: *Deleted

## 2015-09-14 ENCOUNTER — Other Ambulatory Visit (HOSPITAL_BASED_OUTPATIENT_CLINIC_OR_DEPARTMENT_OTHER): Payer: Medicare Other

## 2015-09-14 ENCOUNTER — Encounter: Payer: Self-pay | Admitting: Hematology

## 2015-09-14 VITALS — BP 111/66 | HR 95 | Temp 98.0°F | Resp 17 | Wt 132.7 lb

## 2015-09-14 DIAGNOSIS — E039 Hypothyroidism, unspecified: Secondary | ICD-10-CM

## 2015-09-14 DIAGNOSIS — I824Z2 Acute embolism and thrombosis of unspecified deep veins of left distal lower extremity: Secondary | ICD-10-CM | POA: Diagnosis not present

## 2015-09-14 DIAGNOSIS — C250 Malignant neoplasm of head of pancreas: Secondary | ICD-10-CM

## 2015-09-14 DIAGNOSIS — E119 Type 2 diabetes mellitus without complications: Secondary | ICD-10-CM

## 2015-09-14 DIAGNOSIS — I10 Essential (primary) hypertension: Secondary | ICD-10-CM | POA: Diagnosis not present

## 2015-09-14 DIAGNOSIS — C251 Malignant neoplasm of body of pancreas: Secondary | ICD-10-CM

## 2015-09-14 LAB — CBC WITH DIFFERENTIAL/PLATELET
BASO%: 0.2 % (ref 0.0–2.0)
Basophils Absolute: 0 10*3/uL (ref 0.0–0.1)
EOS ABS: 0.3 10*3/uL (ref 0.0–0.5)
EOS%: 5.9 % (ref 0.0–7.0)
HCT: 32.1 % — ABNORMAL LOW (ref 34.8–46.6)
HGB: 10.3 g/dL — ABNORMAL LOW (ref 11.6–15.9)
LYMPH%: 35.3 % (ref 14.0–49.7)
MCH: 30.6 pg (ref 25.1–34.0)
MCHC: 32.1 g/dL (ref 31.5–36.0)
MCV: 95.3 fL (ref 79.5–101.0)
MONO#: 0.7 10*3/uL (ref 0.1–0.9)
MONO%: 11.4 % (ref 0.0–14.0)
NEUT%: 47.2 % (ref 38.4–76.8)
NEUTROS ABS: 2.7 10*3/uL (ref 1.5–6.5)
PLATELETS: 195 10*3/uL (ref 145–400)
RBC: 3.37 10*6/uL — AB (ref 3.70–5.45)
RDW: 21.4 % — ABNORMAL HIGH (ref 11.2–14.5)
WBC: 5.8 10*3/uL (ref 3.9–10.3)
lymph#: 2 10*3/uL (ref 0.9–3.3)

## 2015-09-14 LAB — PROTIME-INR
INR: 1.9 — AB (ref 2.00–3.50)
Protime: 22.8 Seconds — ABNORMAL HIGH (ref 10.6–13.4)

## 2015-09-14 LAB — COMPREHENSIVE METABOLIC PANEL
ALT: <9 U/L (ref 0–55)
ANION GAP: 11 meq/L (ref 3–11)
AST: 8 U/L (ref 5–34)
Albumin: 3.3 g/dL — ABNORMAL LOW (ref 3.5–5.0)
Alkaline Phosphatase: 66 U/L (ref 40–150)
BUN: 18.7 mg/dL (ref 7.0–26.0)
CHLORIDE: 106 meq/L (ref 98–109)
CO2: 24 meq/L (ref 22–29)
Calcium: 10.1 mg/dL (ref 8.4–10.4)
Creatinine: 0.8 mg/dL (ref 0.6–1.1)
EGFR: 66 mL/min/{1.73_m2} — AB (ref >90)
GLUCOSE: 201 mg/dL — AB (ref 70–140)
POTASSIUM: 4 meq/L (ref 3.5–5.1)
SODIUM: 141 meq/L (ref 136–145)
Total Bilirubin: 0.39 mg/dL (ref 0.20–1.20)
Total Protein: 7 g/dL (ref 6.4–8.3)

## 2015-09-14 MED ORDER — TRAMADOL HCL 50 MG PO TABS: 50.0000 mg | ORAL_TABLET | Freq: Four times a day (QID) | ORAL | Status: DC | PRN

## 2015-09-14 MED ORDER — CAPECITABINE 500 MG PO TABS: 850.0000 mg/m2 | ORAL_TABLET | Freq: Two times a day (BID) | ORAL | Status: DC

## 2015-09-14 MED ORDER — WARFARIN SODIUM 2 MG PO TABS: 2.0000 mg | ORAL_TABLET | Freq: Every day | ORAL | Status: DC

## 2015-09-14 MED FILL — CAPECITABINE 500 MG TABLET: 500 | 21 days supply | Qty: 70 | Fill #0

## 2015-09-14 NOTE — Telephone Encounter (Signed)
Gave and pritned appt sched and avs fo rpt for May adn JUNE

## 2015-09-14 NOTE — Progress Notes (Signed)
Oncology Nurse Navigator Documentation  Oncology Nurse Navigator Flowsheets 09/14/2015  Navigator Location [facility]  Navigator Encounter Type Follow-up Appt  Abnormal Finding Date -  Treatment Initiated Date -  Patient Visit Type MedOnc  Treatment Phase Active Tx--oral Xeloda  Barriers/Navigation Needs No barriers at this time;No Questions;No Needs  Education -  Interventions Education Method--stressed importance of pushing po fluids and getting out of car often to stretch on her route to [place].  Referrals -  Coordination of Care -  Education Method Verbal  Support Groups/Services -  Acuity -  Time Spent with Patient -  Feeling well and tolerated last cycle of Xeloda well.

## 2015-09-14 NOTE — Progress Notes (Signed)
[name]  [phone] [phone]  Clinic Follow Up Note   Patient Care Team: Hulan Fess, MD as PCP - General (Family Medicine) [name], MD as Consulting Physician (Hematology) Arta Silence, MD as Consulting Physician (Gastroenterology) 09/14/2015  CHIEF COMPLAINTS:  Follow up unresectable pancreatic adenocarcinoma    OTHER RELATED ISSUES 1. Left LE DVT diagnosed on 06/09/2015, started on lovenox and bridged to coumadin   Oncology History   Pancreatic cancer [facility])   Staging form: Pancreas, AJCC 7th Edition     Clinical stage from 05/27/2014: Stage IIB (T2, N1, M0) - Signed by [name], MD on 05/31/2015       Pancreatic cancer ([place])   05/16/2015 Imaging CT chest, abdomen and pelvis with contrast showed a bulky mass at pancreatic neck/body, tumor encases the celiac and proximal branches and a cruise the main portal vein, peripancreatic adenopathy. no distant mets.    05/28/2015 Initial Biopsy Pancreatic mass needle biopsy showed well differentiated adenocarcinoma   05/31/2015 Initial Diagnosis Pancreatic cancer ([place])   05/31/2015 Tumor Marker CA19.9 =375   06/05/2015 - 07/03/2015 Chemotherapy weekly gemcitabine 1000mg /m2, dose reduced to 850mg /m2 on week 2 due to tolerance issue   08/27/2015 -  Chemotherapy xeloda 1000mg  bid, 14 days on, 7 days off    HISTORY OF PRESENTING ILLNESS:  Nicole Sherman 82 y.o. female is here because of her recent abnormal CT scan, which showed a pink vaginal mass, highly suspicious for pancreatic cancer.  She has been haivng epigastric pain after meal for 4-5 months. She also reports left side pain at night when she sleeps on left side, mild back pain, the pain is worse lately, lasts longer especially afte rdinner, it about 5/10, appetite is lower than before, she lost aobut 10-20lbs in the past 5 months. No other complains, she had loose BM 1-2 time BM for 8-10 months, no hematochezia or melena. She was evaluated by  her primary care physician Dr. Rex Kras, abdominal ultrasound was negative on 05/04/2015. She underwent a CT abdomen and pelvis with contrast on 05/16/2015, which showed a 4 x 1 cm mass in pancreatic body and a neck, tumor encases the distal celiac and proximal branches and occludes the main portal vein. No distant metastasis on the CT scan. She was referred to [place] for further workup.  She has good energy level, she is able to do all house work. She has intermittent dizziness from blood pressure meds. No other complains.   CURRENT THERAPY: Xeloda 1000mg  bid, 14 days on, 7 days off, started on 08/27/2015  INTERIM HISTORY: Nicole Sherman returns for follow-up. She is accompanied her husband and one daughter. She completed first cycle xeloda last Sunday. She overall tolerated very well, has slightly worse fatigue, but overall much better tolerated than intravenous chemotherapy. She still takes naps in the afternoon on some time, but able to tolerate her routine activities. Her abdominal pain stable, she takes tramadol twice a day, appetite is decent, her weight is stable. No other new complaints.   MEDICAL HISTORY:  Past Medical History  Diagnosis Date  . Hypertension   . Hypothyroidism   . Diabetes mellitus without complication ([place])   . Arthritis     osteoarthritis-hands wrist  . Diverticulosis     showing on CT of abdomen 05-16-15  . Cancer [facility])     pancreatic mass- dx. adenocarcinoma" CT abdomen 05-16-15    SURGICAL HISTORY: Past Surgical History  Procedure Laterality Date  . Joint replacement Right   .  Shoulder arthroscopy w/ rotator cuff repair Left   . Shoulder open rotator cuff repair Right   . Tubal ligation    . Appendectomy      open  . Ankle surgery Right   . Eye surgery      macular hole surgery repair  . Cataract extraction Right   . Eus N/A 05/28/2015    Procedure: UPPER ENDOSCOPIC ULTRASOUND (EUS) LINEAR;  Surgeon: Arta Silence, MD;  Location: [facility];  Service: Endoscopy;   Laterality: N/A;    SOCIAL HISTORY: Social History   Social History  . Marital Status: Married    Spouse Name: N/A  . Number of Children: 2 daughters   . Years of Education: N/A   Occupational History  . Not on file.   Social History Main Topics  . Smoking status: Former Smoker -- 1.00 packs/day for 30 years    Types: Cigarettes    Quit date: 05/21/1986  . Smokeless tobacco: Not on file  . Alcohol Use: No  . Drug Use: No  . Sexual Activity: Not on file   Other Topics Concern  . Not on file   Social History Narrative    FAMILY HISTORY: Family History  Problem Relation Age of Onset  . Stroke Mother   . Cancer Sister     lung cancer     ALLERGIES:  is allergic to keflex and aspirin.  MEDICATIONS:  Current Outpatient Prescriptions  Medication Sig Dispense Refill  . capecitabine (XELODA) 500 MG tablet Take 3 tablets (1,500 mg total) by mouth 2 (two) times daily after a meal. 2 weeks on and 1 week off 70 tablet 0  . levothyroxine (SYNTHROID, LEVOTHROID) 100 MCG tablet Take 100 mcg by mouth daily before breakfast.     . lisinopril (PRINIVIL,ZESTRIL) 20 MG tablet Take 20 mg by mouth daily.     . metFORMIN (GLUCOPHAGE) 1000 MG tablet Take 1,000 mg by mouth 2 (two) times daily with a meal.     . omeprazole (PRILOSEC OTC) 20 MG tablet Take 20 mg by mouth daily as needed (acid reflux). Reported on 07/19/2015    . ondansetron (ZOFRAN) 8 MG tablet Take 1 tablet (8 mg total) by mouth 2 (two) times daily as needed (Nausea or vomiting). 30 tablet 1  . traMADol (ULTRAM) 50 MG tablet Take 1 tablet (50 mg total) by mouth every 6 (six) hours as needed. 60 tablet 0  . warfarin (COUMADIN) 1 MG tablet Take 1 tablet (1 mg total) by mouth daily. (Patient taking differently: Take 1 mg by mouth daily. Take as directed) 60 tablet 0  . warfarin (COUMADIN) 2 MG tablet Take 1 tablet (2 mg total) by mouth daily. 30 tablet 1   No current facility-administered medications for this visit.     REVIEW OF SYSTEMS:   Constitutional: Denies fevers, chills or abnormal night sweats Eyes: Denies blurriness of vision, double vision or watery eyes Ears, nose, mouth, throat, and face: Denies mucositis or sore throat Respiratory: Denies cough, dyspnea or wheezes Cardiovascular: Denies palpitation, chest discomfort or lower extremity swelling Gastrointestinal:  Denies nausea, heartburn or change in bowel habits Skin: Denies abnormal skin rashes Lymphatics: Denies new lymphadenopathy or easy bruising Neurological:Denies numbness, tingling or new weaknesses Behavioral/Psych: Mood is stable, no new changes  All other systems were reviewed with the patient and are negative.  PHYSICAL EXAMINATION: ECOG PERFORMANCE STATUS: 1-2  Filed Vitals:   09/14/15 1031  BP: 111/66  Pulse: 95  Temp: 98 F (36.7 C)  Resp: 17   Filed Weights   09/14/15 1031  Weight: 132 lb 11.2 oz (60.192 kg)    GENERAL:alert, no distress and comfortable SKIN: skin color, texture, turgor are normal, mild scatter skin rashes involving both breasts and upper agdomen EYES: normal, conjunctiva are pink and non-injected, sclera clear OROPHARYNX:no exudate, no erythema and lips, buccal mucosa, and tongue normal  NECK: supple, thyroid normal size, non-tender, without nodularity LYMPH:  no palpable lymphadenopathy in the cervical, axillary or inguinal LUNGS: clear to auscultation and percussion with normal breathing effort HEART: regular rate & rhythm and no murmurs and no lower extremity edema ABDOMEN:abdomen soft, non-tender and normal bowel sounds Musculoskeletal:no cyanosis of digits and no clubbing  PSYCH: alert & oriented x 3 with fluent speech NEURO: no focal motor/sensory deficits  LABORATORY DATA:  I have reviewed the data as listed  CBC Latest Ref Rng 09/14/2015 09/03/2015 08/23/2015  WBC 3.9 - 10.3 10e3/uL 5.8 5.7 6.4  Hemoglobin 11.6 - 15.9 g/dL 10.3(L) 10.0(L) 10.2(L)  Hematocrit 34.8 - 46.6 %  32.1(L) 31.8(L) 32.3(L)  Platelets 145 - 400 10e3/uL 195 227 273   CMP Latest Ref Rng 09/14/2015 09/03/2015 08/23/2015  Glucose 70 - 140 mg/dl 201(H) 147(H) 136  BUN 7.0 - 26.0 mg/dL 18.7 13.4 13.8  Creatinine 0.6 - 1.1 mg/dL 0.8 0.8 0.8  Sodium 136 - 145 mEq/L 141 138 139  Potassium 3.5 - 5.1 mEq/L 4.0 3.7 4.4  CO2 22 - 29 mEq/L 24 22 19(L)  Calcium 8.4 - 10.4 mg/dL 10.1 9.5 9.8  Total Protein 6.4 - 8.3 g/dL 7.0 6.8 7.4  Total Bilirubin 0.20 - 1.20 mg/dL 0.39 0.42 0.37  Alkaline Phos 40 - 150 U/L 66 67 76  AST 5 - 34 U/L 8 13 13   ALT 0 - 55 U/L <9 <9 <9   PT/INR today: 22.8/1.9   CA19.9 u/ML 05/31/2015: 751 07/03/2015: 227 07/19/2015: 159 08/23/2015: 304 09/03/2015: 309  PATHOLOGY REPORT  Diagnosis 05/28/2015 FINE NEEDLE ASPIRATION: NEEDLE ASPIRATION, PANCREAS BODY (SPECIMEN 1 OF 1 COLLECTED 05/28/15): WELL DIFFERENTIATED ADENOCARCINOMA. Preliminary Diagnosis Intraoperative Diagnosis: Adequate ([facility])   RADIOGRAPHIC STUDIES: I have personally reviewed the radiological images as listed and agreed with the findings in the report. No results found.  ASSESSMENT & PLAN:  82 year old Caucasian female, presented with intermittent abdominal pain, weight loss, and a CT finding of a pancreatic mass.  1. Pancreatic body/head adenocarcinoma, well differentiated, cT2N1M0, stage IIB, unresectable -I previously reviewed her CT chest results, which was negative for metastatic disease. -The biopsy results was reviewed with her and her family members in detail. -Her case was discussed in our GI tumor Board yesterday, Dr. Barry Dienes feels this is not resectable disease. -I reviewed the nature history of pancreatic cancer, which is aggressive. Her disease is incurable at this stage, and the goal of therapy is palliative and prolong her life. -I recommend palliative systemic chemotherapy  -she tolerated first line chemo gemcitabine poorly, with diarrhea, poor appetite, and skin rash, and she does not wish  to continue. - I reviewed her restaging CT scan from 3/16, which showed stable pancreatic tumor, no liver or other metastasis  -Her tumor marker CA 19.9 has dropped significantly since she started chemotherapy, consistent with response to chemotherapy. It did when up again after she came off chemotherapy. -She is currently on second line chemotherapy with Xeloda, she tolerated the first cycle with dose reduction very well, and wish to continue. She now has financial assistance program to cover her high co-pay. -Lab results  reviewed with patient and her family, adequate for treatment. She will start cycle 2 next Monday, May 15. I recommend her to start to increase dose to 1500mg  in the morning, and 1000 mg in the evening. -I'll see her back in about 3 weeks before she starts cycle 3. Plan to start chem   2. Left LE DVT -Probably provoked by her underlying malignancy and chemotherapy -I recommend anticoagulation indefinitely, giving her incurable malignancy, if no contraindications such as bleeding, occasions. -I have decreased her Coumadin dose due to her supratherapeutic INR and potential interaction of Coumadin and Xeloda. -I'll change her coumadin dosed to the following all of when she is on Xeloda, Coumadin 1 mg on MWF, and 2 mg on the rest of week. When she is off Xeloda, she will take Coumadin 2 mg on MWF, and 3 mg on the rest of week. INR 1.9 today, we'll continue current dose.  3. Fatigue, anorexia, and abdominal pain -improved some, she'll continue tramadol as needed.  -I encouraged her to have nutritional supplement, and be physically as active as she can  -I encouraged her to follow-up with our dietitian palpable, she declined at this point  4.HTN, DM, hypothyroidism  -follow up with PCP  -We will monitor her blood pressure and blood glucose closely during her therapy.   Plan -start cycle 2 Xeloda with 1500mg  am, 1000mg  pm, 2 weeks on and 1 week off on 5/15, I [facility] in to [facility]. -Lab test with INR on May 22 -I refilled her coumadin and  tramadol today  -RTC in 3 weeks with lab   All questions were answered. The patient knows to call the clinic with any problems, questions or concerns.  I spent 25 minutes counseling the patient face to face. The total time spent in the appointment was 30 minutes and more than 50% was on counseling.    [name], MD 09/14/2015

## 2015-09-17 ENCOUNTER — Telehealth: Payer: Self-pay | Admitting: *Deleted

## 2015-09-17 NOTE — Telephone Encounter (Signed)
Received call from husband requesting a call from nurse.  Spoke with husband and was informed that pt started Xeloda this am - 1500 mg  BID.   Husband needed clarification of Coumadin dosage.  Per Jeneen Rinks, pt took Coumadin 2 mg  Fri 5/12 , and 3 mg on  Sat 5/13 and Sun 09/16/15.   Dr. Burr Medico notified. Spoke with Jeneen Rinks again and instructed him re:  Per Dr. Burr Medico, 1.  Take Coumadin 1 mg on  M W F  ;   2 mg  All  Other  Days  For  2  Weeks  While on Xeloda. When  Off  Xeloda , pt takes : 1.  Coumadin  2 mg  M W F   ;   3 mg  All  Other   Days. Pt to have lab rechecked on  09/24/15.   Jeneen Rinks voiced understanding.

## 2015-09-24 ENCOUNTER — Other Ambulatory Visit: Payer: Self-pay | Admitting: *Deleted

## 2015-09-24 ENCOUNTER — Ambulatory Visit (HOSPITAL_BASED_OUTPATIENT_CLINIC_OR_DEPARTMENT_OTHER): Payer: Medicare Other | Admitting: Nurse Practitioner

## 2015-09-24 ENCOUNTER — Ambulatory Visit (HOSPITAL_COMMUNITY)
Admission: RE | Admit: 2015-09-24 | Discharge: 2015-09-24 | Disposition: A | Discharge status: Home / Self Care | Payer: Medicare Other | Source: Ambulatory Visit | Attending: Nurse Practitioner | Admitting: Nurse Practitioner

## 2015-09-24 ENCOUNTER — Other Ambulatory Visit (HOSPITAL_BASED_OUTPATIENT_CLINIC_OR_DEPARTMENT_OTHER): Payer: Medicare Other

## 2015-09-24 ENCOUNTER — Telehealth: Payer: Self-pay | Admitting: *Deleted

## 2015-09-24 ENCOUNTER — Encounter (HOSPITAL_COMMUNITY): Payer: Self-pay

## 2015-09-24 VITALS — BP 98/86 | HR 103 | Temp 98.7°F | Resp 18 | Ht 66.5 in | Wt 131.2 lb

## 2015-09-24 DIAGNOSIS — I959 Hypotension, unspecified: Secondary | ICD-10-CM | POA: Diagnosis not present

## 2015-09-24 DIAGNOSIS — W19XXXA Unspecified fall, initial encounter: Secondary | ICD-10-CM | POA: Insufficient documentation

## 2015-09-24 DIAGNOSIS — Y92099 Unspecified place in other non-institutional residence as the place of occurrence of the external cause: Secondary | ICD-10-CM | POA: Diagnosis not present

## 2015-09-24 DIAGNOSIS — I824Z2 Acute embolism and thrombosis of unspecified deep veins of left distal lower extremity: Secondary | ICD-10-CM | POA: Diagnosis not present

## 2015-09-24 DIAGNOSIS — R609 Edema, unspecified: Secondary | ICD-10-CM | POA: Insufficient documentation

## 2015-09-24 DIAGNOSIS — C250 Malignant neoplasm of head of pancreas: Secondary | ICD-10-CM

## 2015-09-24 DIAGNOSIS — R42 Dizziness and giddiness: Secondary | ICD-10-CM

## 2015-09-24 DIAGNOSIS — C251 Malignant neoplasm of body of pancreas: Secondary | ICD-10-CM

## 2015-09-24 DIAGNOSIS — Y92009 Unspecified place in unspecified non-institutional (private) residence as the place of occurrence of the external cause: Secondary | ICD-10-CM

## 2015-09-24 DIAGNOSIS — Z7901 Long term (current) use of anticoagulants: Secondary | ICD-10-CM

## 2015-09-24 LAB — COMPREHENSIVE METABOLIC PANEL
ALBUMIN: 3.4 g/dL — AB (ref 3.5–5.0)
ALK PHOS: 72 U/L (ref 40–150)
ALT: 9 U/L (ref 0–55)
AST: 10 U/L (ref 5–34)
Anion Gap: 10 mEq/L (ref 3–11)
BILIRUBIN TOTAL: 0.41 mg/dL (ref 0.20–1.20)
BUN: 16.4 mg/dL (ref 7.0–26.0)
CALCIUM: 10 mg/dL (ref 8.4–10.4)
CO2: 22 mEq/L (ref 22–29)
CREATININE: 1 mg/dL (ref 0.6–1.1)
Chloride: 106 mEq/L (ref 98–109)
EGFR: 57 mL/min/{1.73_m2} — ABNORMAL LOW (ref >90)
Glucose: 261 mg/dl — ABNORMAL HIGH (ref 70–140)
Potassium: 5.4 mEq/L — ABNORMAL HIGH (ref 3.5–5.1)
Sodium: 139 mEq/L (ref 136–145)
TOTAL PROTEIN: 7 g/dL (ref 6.4–8.3)

## 2015-09-24 LAB — CBC WITH DIFFERENTIAL/PLATELET
BASO%: 0.9 % (ref 0.0–2.0)
Basophils Absolute: 0 10*3/uL (ref 0.0–0.1)
EOS%: 4.6 % (ref 0.0–7.0)
Eosinophils Absolute: 0.2 10*3/uL (ref 0.0–0.5)
HEMATOCRIT: 32 % — AB (ref 34.8–46.6)
HEMOGLOBIN: 10.1 g/dL — AB (ref 11.6–15.9)
LYMPH#: 1.9 10*3/uL (ref 0.9–3.3)
LYMPH%: 41.3 % (ref 14.0–49.7)
MCH: 30.3 pg (ref 25.1–34.0)
MCHC: 31.7 g/dL (ref 31.5–36.0)
MCV: 95.6 fL (ref 79.5–101.0)
MONO#: 0.5 10*3/uL (ref 0.1–0.9)
MONO%: 10.8 % (ref 0.0–14.0)
NEUT#: 2 10*3/uL (ref 1.5–6.5)
NEUT%: 42.4 % (ref 38.4–76.8)
Platelets: 201 10*3/uL (ref 145–400)
RBC: 3.35 10*6/uL — ABNORMAL LOW (ref 3.70–5.45)
RDW: 22.9 % — AB (ref 11.2–14.5)
WBC: 4.7 10*3/uL (ref 3.9–10.3)

## 2015-09-24 LAB — PROTIME-INR
INR: 4.4 — ABNORMAL HIGH (ref 2.00–3.50)
PROTIME: 52.8 s — AB (ref 10.6–13.4)

## 2015-09-24 NOTE — Telephone Encounter (Signed)
Call received from patient "asking to see Nicole Sherman to tell me it's okay or do an xray.  I fell Thursday (09-20-2015) getting out of the shower.  I hit the crown of my head.  No bleeding, just a knot that hurts when I touch it or lie on this area when in bed.  I threw up Friday morning's breakfast but no more n/v.  No visual changes no un-eveness to my face or smile, no trouble using my hands or feet.  My head is just still sore and hurts."  Advised she go to ED but "I do not think it's bad enough to go to the ED.  I just need something scheduled around my 2:00 pm lab appointment today with [place] or an xray."  return number [phone].

## 2015-09-24 NOTE — Telephone Encounter (Signed)
Additional lab orders for today- Hold Tube and PTINR. Pt is in route to [facility] now for evaluation. Per Maudie Mercury in the lab pt ok to check into lab appt early. Added [facility] appt.

## 2015-09-25 ENCOUNTER — Telehealth: Payer: Self-pay | Admitting: Nurse Practitioner

## 2015-09-25 ENCOUNTER — Encounter: Payer: Self-pay | Admitting: Nurse Practitioner

## 2015-09-25 DIAGNOSIS — W19XXXA Unspecified fall, initial encounter: Secondary | ICD-10-CM | POA: Insufficient documentation

## 2015-09-25 DIAGNOSIS — I959 Hypotension, unspecified: Secondary | ICD-10-CM | POA: Insufficient documentation

## 2015-09-25 DIAGNOSIS — Y92009 Unspecified place in unspecified non-institutional (private) residence as the place of occurrence of the external cause: Secondary | ICD-10-CM

## 2015-09-25 NOTE — Progress Notes (Signed)
SYMPTOM MANAGEMENT CLINIC    Chief Complaint: Fall  HPI:  Nicole Sherman 82 y.o. female diagnosed with pancreatic cancer.  Currently undergoing Xeloda oral therapy. Patient states she has been feeling increasingly tired/fatigued.  She states that she was in the shower; and attempted to lift her leg to wash it.  She fell out of the tub; hitting her head and face during the fall.  She denies any loss of consciousness.  Patient states that she experienced no neurological issues; until earlier this morning when she woke up with a mild headache.  She denies any vision changes, weakness, or other new issues.  She does admit to some tenderness to the top of her head where she hit it.  Exam today reveals a large healing bruise to the crown of her head.  Patient also has a healing bruise to the right side of her temple as well.  Questionable erythema between patient's eyes as well; but, no bruise.  Note-patient's blood pressure on initial check at the cancer center was 96/69.  Patient confirmed that she does take lisinopril 20 mg every day as directed for her history of hypertension.  Also, the patient's INR today was 4.4.  Advised patient that she could have become increasingly weak due to low blood pressure.  Will plan on holding patient's lisinopril for the next 2 days to see if this helps.  Patient was advised to check her blood pressure at home; and to keep a record of it to bring back to the [facility] when she returns this coming Wednesday, 09/26/2015.  Also, patient underwent a head CT for further evaluation following her fall; and head CT was negative for any acute findings.  Patient was also instructed to hold all of her Coumadin until her recheck this coming Wednesday as well.  Patient was advised to go directly to the emergency department for any worsening symptoms in the interim whatsoever.      Oncology History   Pancreatic cancer [facility])   Staging form: Pancreas, AJCC 7th  Edition     Clinical stage from 05/27/2014: Stage IIB (T2, N1, M0) - Signed by [name], MD on 05/31/2015       Pancreatic cancer ([place])   05/16/2015 Imaging CT chest, abdomen and pelvis with contrast showed a bulky mass at pancreatic neck/body, tumor encases the celiac and proximal branches and a cruise the main portal vein, peripancreatic adenopathy. no distant mets.    05/28/2015 Initial Biopsy Pancreatic mass needle biopsy showed well differentiated adenocarcinoma   05/31/2015 Initial Diagnosis Pancreatic cancer ([place])   05/31/2015 Tumor Marker CA19.9 =375   06/05/2015 - 07/03/2015 Chemotherapy weekly gemcitabine 1022m/m2, dose reduced to 8512mm2 on week 2 due to tolerance issue   08/27/2015 -  Chemotherapy xeloda 100071mid, 14 days on, 7 days off    Review of Systems  Constitutional: Positive for malaise/fatigue.  Skin:       Fall with bruising to the top of her head and to the right temple area.  Neurological: Positive for weakness and headaches.  All other systems reviewed and are negative.   Past Medical History  Diagnosis Date  . Hypertension   . Hypothyroidism   . [place] ([place] . Arthritis     osteoarthritis-hands wrist  . Diverticulosis     showing on CT of abdomen 05-16-15  [facility]   pancreatic mass- dx. adenocarcinoma" CT abdomen 05-16-15    Past Surgical History  Procedure Laterality Date  . Joint replacement Right   . Shoulder arthroscopy w/ rotator cuff repair Left   . Shoulder open rotator cuff repair Right   . Tubal ligation    . Appendectomy      open  . Ankle surgery Right   . Eye surgery      macular hole surgery repair  . Cataract extraction Right   . Eus N/A 05/28/2015    Procedure: UPPER ENDOSCOPIC ULTRASOUND (EUS) LINEAR;  Surgeon: Arta Silence, MD;  Location: [facility] ENDOSCOPY;  Service: Endoscopy;  Laterality: N/A;    has Pancreatic cancer ([place]); Long term current use of anticoagulant therapy; Acute deep vein  thrombosis (DVT) of distal vein of left lower extremity ([place]); Hypothyroidism; Hypertension; Diabetes mellitus without complication ([place]); Fall; and Hypotension on her problem list.    is allergic to keflex and aspirin.    Medication List       This list is accurate as of: 09/24/15 11:59 PM.  Always use your most recent med list.               capecitabine 500 MG tablet  Commonly known as:  XELODA  Take 3 tablets (1,500 mg total) by mouth 2 (two) times daily after a meal. 2 weeks on and 1 week off     levothyroxine 100 MCG tablet  Commonly known as:  SYNTHROID, LEVOTHROID  Take 100 mcg by mouth daily before breakfast.     lisinopril 20 MG tablet  Commonly known as:  PRINIVIL,ZESTRIL  Take 20 mg by mouth daily.     metFORMIN 1000 MG tablet  Commonly known as:  GLUCOPHAGE  Take 1,000 mg by mouth 2 (two) times daily with a meal.     omeprazole 20 MG tablet  Commonly known as:  PRILOSEC OTC  Take 20 mg by mouth daily as needed (acid reflux). Reported on 07/19/2015     ondansetron 8 MG tablet  Commonly known as:  ZOFRAN  Take 1 tablet (8 mg total) by mouth 2 (two) times daily as needed (Nausea or vomiting).     traMADol 50 MG tablet  Commonly known as:  ULTRAM  Take 1 tablet (50 mg total) by mouth every 6 (six) hours as needed.     warfarin 1 MG tablet  Commonly known as:  COUMADIN  Take 1 tablet (1 mg total) by mouth daily.     warfarin 2 MG tablet  Commonly known as:  COUMADIN  Take 1 tablet (2 mg total) by mouth daily.         PHYSICAL EXAMINATION  Oncology Vitals 09/24/2015 09/14/2015  Height 169 cm -  Weight 59.512 kg 60.192 kg  Weight (lbs) 131 lbs 3 oz 132 lbs 11 oz  BMI (kg/m2) 20.86 kg/m2 -  Temp 98.7 98  Pulse 103 95  Resp 18 17  SpO2 99 97  BSA (m2) 1.67 m2 -   BP Readings from Last 2 Encounters:  09/24/15 98/86  09/14/15 111/66    Physical Exam  Constitutional: She is oriented to person, place, and time. She appears malnourished. She appears  unhealthy. She appears cachectic.  HENT:  Head: Normocephalic.  Mouth/Throat: Oropharynx is clear and moist.  Exam today reveals a large healing bruise to the crown of her head.  Patient also has a healing bruise to the right side of her temple as well.  Questionable erythema between patient's eyes as well; but, no bruise.      Eyes: Conjunctivae and EOM are  normal. Pupils are equal, round, and reactive to light. Right eye exhibits no discharge. Left eye exhibits no discharge. No scleral icterus.  Neck: Normal range of motion. Neck supple. No JVD present. No tracheal deviation present. No thyromegaly present.  Cardiovascular: Normal rate, regular rhythm, normal heart sounds and intact distal pulses.   Pulmonary/Chest: Effort normal and breath sounds normal. No respiratory distress. She has no wheezes. She has no rales. She exhibits no tenderness.  Abdominal: Soft. Bowel sounds are normal. She exhibits no distension and no mass. There is no tenderness. There is no rebound and no guarding.  Musculoskeletal: Normal range of motion. She exhibits no edema or tenderness.  Lymphadenopathy:    She has no cervical adenopathy.  Neurological: She is alert and oriented to person, place, and time. Gait normal.  Skin: Skin is warm and dry. No rash noted. No erythema. No pallor.  Psychiatric: Affect normal.    LABORATORY DATA:. Appointment on 09/24/2015  Component Date Value Ref Range Status  . WBC 09/24/2015 4.7  3.9 - 10.3 10e3/uL Final  . NEUT# 09/24/2015 2.0  1.5 - 6.5 10e3/uL Final  . HGB 09/24/2015 10.1* 11.6 - 15.9 g/dL Final  . HCT 09/24/2015 32.0* 34.8 - 46.6 % Final  . Platelets 09/24/2015 201  145 - 400 10e3/uL Final  . MCV 09/24/2015 95.6  79.5 - 101.0 fL Final  . MCH 09/24/2015 30.3  25.1 - 34.0 pg Final  . MCHC 09/24/2015 31.7  31.5 - 36.0 g/dL Final  . RBC 09/24/2015 3.35* 3.70 - 5.45 10e6/uL Final  . RDW 09/24/2015 22.9* 11.2 - 14.5 % Final  . lymph# 09/24/2015 1.9  0.9 - 3.3  10e3/uL Final  . MONO# 09/24/2015 0.5  0.1 - 0.9 10e3/uL Final  . Eosinophils Absolute 09/24/2015 0.2  0.0 - 0.5 10e3/uL Final  . Basophils Absolute 09/24/2015 0.0  0.0 - 0.1 10e3/uL Final  . NEUT% 09/24/2015 42.4  38.4 - 76.8 % Final  . LYMPH% 09/24/2015 41.3  14.0 - 49.7 % Final  . MONO% 09/24/2015 10.8  0.0 - 14.0 % Final  . EOS% 09/24/2015 4.6  0.0 - 7.0 % Final  . BASO% 09/24/2015 0.9  0.0 - 2.0 % Final  . Sodium 09/24/2015 139  136 - 145 mEq/L Final  . Potassium 09/24/2015 5.4* 3.5 - 5.1 mEq/L Final  . Chloride 09/24/2015 106  98 - 109 mEq/L Final  . CO2 09/24/2015 22  22 - 29 mEq/L Final  . Glucose 09/24/2015 261* 70 - 140 mg/dl Final   Glucose reference range is for nonfasting patients. Fasting glucose reference range is 70- 100.  Marland Kitchen BUN 09/24/2015 16.4  7.0 - 26.0 mg/dL Final  . Creatinine 09/24/2015 1.0  0.6 - 1.1 mg/dL Final  . Total Bilirubin 09/24/2015 0.41  0.20 - 1.20 mg/dL Final  . Alkaline Phosphatase 09/24/2015 72  40 - 150 U/L Final  . AST 09/24/2015 10  5 - 34 U/L Final  . ALT 09/24/2015 9  0 - 55 U/L Final  . Total Protein 09/24/2015 7.0  6.4 - 8.3 g/dL Final  . Albumin 09/24/2015 3.4* 3.5 - 5.0 g/dL Final  . Calcium 09/24/2015 10.0  8.4 - 10.4 mg/dL Final  . Anion Gap 09/24/2015 10  3 - 11 mEq/L Final  . EGFR 09/24/2015 57* >90 ml/min/1.73 m2 Final   eGFR is calculated using the CKD-EPI Creatinine Equation (2009)  . Protime 09/24/2015 52.8* 10.6 - 13.4 Seconds Final  . INR 09/24/2015 4.40* 2.00 - 3.50 Final  Comment: INR is useful only to assess adequacy of anticoagulation with coumadin when comparing results from different labs. It should not be used to estimate bleeding risk or presence/abscense of coagulopathy in patients not on coumadin. Expected INR ranges for  nontherapeutic patients is 0.88 - 1.12.   Marland Kitchen Lovenox 09/24/2015 No   Final     RADIOGRAPHIC STUDIES: Ct Head Wo Contrast  09/24/2015  CLINICAL DATA:  82 year old hypertensive female with  headaches. Hit top of head during fall 09/20/2015. Pancreatic cancer diagnosed January 2017 post completion of chemotherapy. Initial encounter. EXAM: CT HEAD WITHOUT CONTRAST TECHNIQUE: Contiguous axial images were obtained from the base of the skull through the vertex without intravenous contrast. COMPARISON:  10/20/2015 head CT. FINDINGS: Soft tissue swelling posterior convexity without underlying fracture or intracranial hemorrhage. Global atrophy without hydrocephalus. No intracranial mass lesion noted on this unenhanced exam. No CT evidence of large acute infarct. Vascular calcifications. Post right lens replacement otherwise orbital structures unremarkable. Mastoid air cells, middle ear cavities and visualized paranasal sinuses are clear. IMPRESSION: Soft tissue swelling posterior convexity without underlying fracture or intracranial hemorrhage. Electronically Signed   By: Genia Del M.D.   On: 09/24/2015 14:46    ASSESSMENT/PLAN:    Pancreatic cancer [facility]) Patient has been taking Xeloda oral therapy as directed.  She started her cycle 2 of the Xeloda on Monday, 09/17/2015.  She has increased the Xeloda to 1500 mg in the morning and 1000 mg in the evening per directions.  She confirmed that she has been taking the Xeloda 14 days on and 7 days off with each cycle.  Patient will be scheduled for return for both labs and a symptom management clinic visit on Wednesday, 09/26/2015.  Patient is also scheduled for labs and a follow-up visit on 10/08/2015.  Long term current use of anticoagulant therapy Patient continues to take Coumadin as directed for a history of left leg DVT in the past.  Patient states that she takes Coumadin 1 mg on Mondays, Wednesdays, and Fridays and 2 mg on every other day of the week while taking her Xeloda oral therapy.  Patient takes Coumadin 2 mg on Monday, Wednesday, and Friday; alternating with 3 mg every other day for a week when she is not taking the Xeloda.  INR  today was 4.4.  Patient was instructed to hold all Coumadin until we can recheck her INR this coming Wednesday, 09/26/2015.  Fall Patient states she has been feeling increasingly tired/fatigued.  She states that she was in the shower; and attempted to lift her leg to wash it.  She fell out of the tub; hitting her head and face during the fall.  She denies any loss of consciousness.  Patient states that she experienced no neurological issues; until earlier this morning when she woke up with a mild headache.  She denies any vision changes, weakness, or other new issues.  She does admit to some tenderness to the top of her head where she hit it.  Exam today reveals a large healing bruise to the crown of her head.  Patient also has a healing bruise to the right side of her temple as well.  Questionable erythema between patient's eyes as well; but, no bruise.  Note-patient's blood pressure on initial check at the cancer center was 96/69.  Patient confirmed that she does take lisinopril 20 mg every day as directed for her history of hypertension.  Also, the patient's INR today was 4.4.  Advised patient that she could  have become increasingly weak due to low blood pressure.  Will plan on holding patient's lisinopril for the next 2 days to see if this helps.  Patient was advised to check her blood pressure at home; and to keep a record of it to bring back to the [place] when she returns this coming Wednesday, 09/26/2015.  Also, patient underwent a head CT for further evaluation following her fall; and head CT was negative for any acute findings.  Patient was also instructed to hold all of her Coumadin until her recheck this coming Wednesday as well.  Patient was advised to go directly to the emergency department for any worsening symptoms in the interim whatsoever.    Hypotension Blood pressure on initial check was 96/69.  Recheck of blood pressure revealed blood pressure 98/86.  Patient states  he has a history of hypertension; has been taking lisinopril 20 mg per day as directed.  Patient was complaining of increasing fatigue/weakness; and fell out of the shower at the end of last week.  Patient was advised that she may be experiencing some hypotension secondary to the lisinopril.  She was advised to hold the lisinopril for the next 2 days; and to keep a record of her blood pressure when she is at home.  Will recheck patient's blood pressure when she returns to the [place] this coming Wednesday, 09/26/2015.   Patient stated understanding of all instructions; and was in agreement with this plan of care. The patient knows to call the clinic with any problems, questions or concerns.   Total time spent with patient was 40 minutes;  with greater than 75 percent of that time spent in face to face counseling regarding patient's symptoms,  and coordination of care and follow up.  Disclaimer:This dictation was prepared with Dragon/digital dictation along with [employer]. Any transcriptional errors that result from this process are unintentional.  Drue Second, NP 09/25/2015

## 2015-09-25 NOTE — Assessment & Plan Note (Signed)
Patient continues to take Coumadin as directed for a history of left leg DVT in the past.  Patient states that she takes Coumadin 1 mg on Mondays, Wednesdays, and Fridays and 2 mg on every other day of the week while taking her Xeloda oral therapy.  Patient takes Coumadin 2 mg on Monday, Wednesday, and Friday; alternating with 3 mg every other day for a week when she is not taking the Xeloda.  INR today was 4.4.  Patient was instructed to hold all Coumadin until we can recheck her INR this coming Wednesday, 09/26/2015.

## 2015-09-25 NOTE — Assessment & Plan Note (Signed)
Patient has been taking Xeloda oral therapy as directed.  She started her cycle 2 of the Xeloda on Monday, 09/17/2015.  She has increased the Xeloda to 1500 mg in the morning and 1000 mg in the evening per directions.  She confirmed that she has been taking the Xeloda 14 days on and 7 days off with each cycle.  Patient will be scheduled for return for both labs and a symptom management clinic visit on Wednesday, 09/26/2015.  Patient is also scheduled for labs and a follow-up visit on 10/08/2015.

## 2015-09-25 NOTE — Telephone Encounter (Signed)
left msg confirming 5/24 apt

## 2015-09-25 NOTE — Assessment & Plan Note (Signed)
Patient states she has been feeling increasingly tired/fatigued.  She states that she was in the shower; and attempted to lift her leg to wash it.  She fell out of the tub; hitting her head and face during the fall.  She denies any loss of consciousness.  Patient states that she experienced no neurological issues; until earlier this morning when she woke up with a mild headache.  She denies any vision changes, weakness, or other new issues.  She does admit to some tenderness to the top of her head where she hit it.  Exam today reveals a large healing bruise to the crown of her head.  Patient also has a healing bruise to the right side of her temple as well.  Questionable erythema between patient's eyes as well; but, no bruise.  Note-patient's blood pressure on initial check at the cancer center was 96/69.  Patient confirmed that she does take lisinopril 20 mg every day as directed for her history of hypertension.  Also, the patient's INR today was 4.4.  Advised patient that she could have become increasingly weak due to low blood pressure.  Will plan on holding patient's lisinopril for the next 2 days to see if this helps.  Patient was advised to check her blood pressure at home; and to keep a record of it to bring back to the [facility] when she returns this coming Wednesday, 09/26/2015.  Also, patient underwent a head CT for further evaluation following her fall; and head CT was negative for any acute findings.  Patient was also instructed to hold all of her Coumadin until her recheck this coming Wednesday as well.  Patient was advised to go directly to the emergency department for any worsening symptoms in the interim whatsoever.

## 2015-09-25 NOTE — Assessment & Plan Note (Signed)
Blood pressure on initial check was 96/69.  Recheck of blood pressure revealed blood pressure 98/86.  Patient states he has a history of hypertension; has been taking lisinopril 20 mg per day as directed.  Patient was complaining of increasing fatigue/weakness; and fell out of the shower at the end of last week.  Patient was advised that she may be experiencing some hypotension secondary to the lisinopril.  She was advised to hold the lisinopril for the next 2 days; and to keep a record of her blood pressure when she is at home.  Will recheck patient's blood pressure when she returns to the [place] this coming Wednesday, 09/26/2015.

## 2015-09-26 ENCOUNTER — Encounter: Payer: Self-pay | Admitting: Nurse Practitioner

## 2015-09-26 ENCOUNTER — Other Ambulatory Visit: Payer: Self-pay | Admitting: Nurse Practitioner

## 2015-09-26 ENCOUNTER — Ambulatory Visit (HOSPITAL_BASED_OUTPATIENT_CLINIC_OR_DEPARTMENT_OTHER): Payer: Medicare Other | Admitting: Nurse Practitioner

## 2015-09-26 ENCOUNTER — Other Ambulatory Visit (HOSPITAL_BASED_OUTPATIENT_CLINIC_OR_DEPARTMENT_OTHER): Payer: Medicare Other

## 2015-09-26 VITALS — BP 131/83 | HR 85 | Temp 98.2°F | Resp 18 | Ht 66.5 in | Wt 132.0 lb

## 2015-09-26 DIAGNOSIS — I959 Hypotension, unspecified: Secondary | ICD-10-CM

## 2015-09-26 DIAGNOSIS — Y92009 Unspecified place in unspecified non-institutional (private) residence as the place of occurrence of the external cause: Secondary | ICD-10-CM

## 2015-09-26 DIAGNOSIS — Z9181 History of falling: Secondary | ICD-10-CM

## 2015-09-26 DIAGNOSIS — Z7901 Long term (current) use of anticoagulants: Secondary | ICD-10-CM

## 2015-09-26 DIAGNOSIS — W19XXXD Unspecified fall, subsequent encounter: Secondary | ICD-10-CM

## 2015-09-26 DIAGNOSIS — C251 Malignant neoplasm of body of pancreas: Secondary | ICD-10-CM

## 2015-09-26 LAB — PROTIME-INR
INR: 2.8 (ref 2.00–3.50)
PROTIME: 33.6 s — AB (ref 10.6–13.4)

## 2015-09-26 NOTE — Assessment & Plan Note (Signed)
Patient's INR when checked on Monday, 09/24/2015 was increased to 4.4.  Patient has been holding her Coumadin and over since.  Recheck of patient's INR today was 2.8.  Patient was advised to resume her Coumadin per previous directions.  Patient will return on 10/04/2015 for labs and a visit.  We will recheck INR at that time.

## 2015-09-26 NOTE — Assessment & Plan Note (Addendum)
Blood pressure on initial check was 96/69.  Recheck of blood pressure revealed blood pressure 98/86.  Patient states he has a history of hypertension; has been taking lisinopril 20 mg per day as directed.  Patient was complaining of increasing fatigue/weakness; and fell out of the shower at the end of last week.  Patient was advised that she may be experiencing some hypotension secondary to the lisinopril.  She was advised to hold the lisinopril for the next 2 days; and to keep a record of her blood pressure when she is at home.  Will recheck patient's blood pressure when she returns to the [place] this coming Wednesday, 09/26/2015. ____________________________________________  Update: Patient has been holding the lisinopril as directed; and blood pressure has improved to 131/83 with recheck today.  However, patient does report one episode of hypertension within the past 48 hours.  Advised patient to resume taking her lisinopril as previously directed; and to follow back up with her primary care physician Dr. Rex Kras for further instructions regarding her blood pressure medication.

## 2015-09-26 NOTE — Assessment & Plan Note (Signed)
Patient has been taking Xeloda oral therapy as directed.  She started her cycle 2 of the Xeloda on Monday, 09/17/2015.  She has increased the Xeloda to 1500 mg in the morning and 1000 mg in the evening per directions.  She confirmed that she has been taking the Xeloda 14 days on and 7 days off with each cycle.  Will change pt's next labs and visit to 10/04/15 for patient's convenience.

## 2015-09-26 NOTE — Assessment & Plan Note (Signed)
Patient states that the bruise to the crown of her head is slowly improving.  She states she no longer is having any headaches whatsoever.  She also denies any other neurological symptoms.  Exam today reveals healing bruising to the crown of patient's head and her right temple.

## 2015-09-26 NOTE — Progress Notes (Signed)
SYMPTOM MANAGEMENT CLINIC    Chief Complaint: Fall  HPI:  Nicole Sherman 82 y.o. female diagnosed with pancreatic cancer.  Currently undergoing Xeloda oral therapy. Patient states she has been feeling increasingly tired/fatigued.  She states that she was in the shower; and attempted to lift her leg to wash it.  She fell out of the tub; hitting her head and face during the fall.  She denies any loss of consciousness.  Patient states that she experienced no neurological issues; until earlier this morning when she woke up with a mild headache.  She denies any vision changes, weakness, or other new issues.  She does admit to some tenderness to the top of her head where she hit it.  Exam today reveals a large healing bruise to the crown of her head.  Patient also has a healing bruise to the right side of her temple as well.  Questionable erythema between patient's eyes as well; but, no bruise.  Note-patient's blood pressure on initial check at the cancer center was 96/69.  Patient confirmed that she does take lisinopril 20 mg every day as directed for her history of hypertension.  Also, the patient's INR today was 4.4.  Advised patient that she could have become increasingly weak due to low blood pressure.  Will plan on holding patient's lisinopril for the next 2 days to see if this helps.  Patient was advised to check her blood pressure at home; and to keep a record of it to bring back to the [place] when she returns this coming Wednesday, 09/26/2015.  Also, patient underwent a head CT for further evaluation following her fall; and head CT was negative for any acute findings.  Patient was also instructed to hold all of her Coumadin until her recheck this coming Wednesday as well.  Patient was advised to go directly to the emergency department for any worsening symptoms in the interim whatsoever.      Oncology History   Pancreatic cancer [facility])   Staging form: Pancreas, AJCC 7th  Edition     Clinical stage from 05/27/2014: Stage IIB (T2, N1, M0) - Signed by [name], MD on 05/31/2015       Pancreatic cancer ([place])   05/16/2015 Imaging CT chest, abdomen and pelvis with contrast showed a bulky mass at pancreatic neck/body, tumor encases the celiac and proximal branches and a cruise the main portal vein, peripancreatic adenopathy. no distant mets.    05/28/2015 Initial Biopsy Pancreatic mass needle biopsy showed well differentiated adenocarcinoma   05/31/2015 Initial Diagnosis Pancreatic cancer ([place])   05/31/2015 Tumor Marker CA19.9 =375   06/05/2015 - 07/03/2015 Chemotherapy weekly gemcitabine 1087m/m2, dose reduced to 8573mm2 on week 2 due to tolerance issue   08/27/2015 -  Chemotherapy xeloda 100065mid, 14 days on, 7 days off    Review of Systems  Constitutional: Positive for malaise/fatigue.  Skin:       Bruising to the top of the scalp in the right temple is slowly improving.  Neurological: Positive for weakness. Negative for headaches.  All other systems reviewed and are negative.   Past Medical History  Diagnosis Date  . Hypertension   . Hypothyroidism   . Diabetes mellitus without complication ([facility] . Arthritis     osteoarthritis-hands wrist  . Diverticulosis     showing on CT of abdomen 05-16-15  . [facility]   pancreatic mass- dx. adenocarcinoma" CT abdomen 05-16-15    Past Surgical History  Procedure Laterality Date  . Joint replacement Right   . Shoulder arthroscopy w/ rotator cuff repair Left   . Shoulder open rotator cuff repair Right   . Tubal ligation    . Appendectomy      open  . Ankle surgery Right   . Eye surgery      macular hole surgery repair  . Cataract extraction Right   . Eus N/A 05/28/2015    Procedure: UPPER ENDOSCOPIC ULTRASOUND (EUS) LINEAR;  Surgeon: Arta Silence, MD;  Location: [facility] END[facility]COPY;  Service: Endoscopy;  Laterality: N/A;    has Pancreatic cancer ([place]); Long term current use of anticoagulant therapy; Acute  deep vein thrombosis (DVT) of distal vein of left lower extremity ([place]); Hypothyroidism; Hypertension; Diabetes mellitus without complication ([place]); Fall at home; and Hypotension on her problem list.    is allergic to keflex and aspirin.    Medication List       This list is accurate as of: 09/26/15 11:15 PM.  Always use your most recent med list.               capecitabine 500 MG tablet  Commonly known as:  XELODA  Take 3 tablets (1,500 mg total) by mouth 2 (two) times daily after a meal. 2 weeks on and 1 week off     levothyroxine 100 MCG tablet  Commonly known as:  SYNTHROID, LEVOTHROID  Take 100 mcg by mouth daily before breakfast.     lisinopril 20 MG tablet  Commonly known as:  PRINIVIL,ZESTRIL  Take 20 mg by mouth daily.     metFORMIN 1000 MG tablet  Commonly known as:  GLUCOPHAGE  Take 1,000 mg by mouth 2 (two) times daily with a meal.     omeprazole 20 MG tablet  Commonly known as:  PRILOSEC OTC  Take 20 mg by mouth daily as needed (acid reflux). Reported on 07/19/2015     ondansetron 8 MG tablet  Commonly known as:  ZOFRAN  Take 1 tablet (8 mg total) by mouth 2 (two) times daily as needed (Nausea or vomiting).     traMADol 50 MG tablet  Commonly known as:  ULTRAM  Take 1 tablet (50 mg total) by mouth every 6 (six) hours as needed.     warfarin 1 MG tablet  Commonly known as:  COUMADIN  Take 1 tablet (1 mg total) by mouth daily.     warfarin 2 MG tablet  Commonly known as:  COUMADIN  Take 1 tablet (2 mg total) by mouth daily.         PHYSICAL EXAMINATION  Oncology Vitals 09/26/2015 09/24/2015  Height 169 cm 169 cm  Weight 59.875 kg 59.512 kg  Weight (lbs) 132 lbs 131 lbs 3 oz  BMI (kg/m2) 20.99 kg/m2 20.86 kg/m2  Temp 98.2 98.7  Pulse 85 103  Resp 18 18  SpO2 95 99  BSA (m2) 1.68 m2 1.67 m2   BP Readings from Last 2 Encounters:  09/26/15 131/83  09/24/15 98/86    Physical Exam  Constitutional: She is oriented to person, place, and time. She  appears malnourished. She appears unhealthy. She appears cachectic.  HENT:  Head: Normocephalic.  Mouth/Throat: Oropharynx is clear and moist.  Exam today reveals a large healing bruise to the crown of her head.  Patient also has a healing bruise to the right side of her temple as well.  Questionable erythema between patient's eyes as well; but, no bruise.      Eyes: Conjunctivae  and EOM are normal. Pupils are equal, round, and reactive to light. Right eye exhibits no discharge. Left eye exhibits no discharge. No scleral icterus.  Neck: Normal range of motion. Neck supple. No JVD present. No tracheal deviation present. No thyromegaly present.  Cardiovascular: Normal rate, regular rhythm, normal heart sounds and intact distal pulses.   Pulmonary/Chest: Effort normal and breath sounds normal. No respiratory distress. She has no wheezes. She has no rales. She exhibits no tenderness.  Abdominal: Soft. Bowel sounds are normal. She exhibits no distension and no mass. There is no tenderness. There is no rebound and no guarding.  Musculoskeletal: Normal range of motion. She exhibits no edema or tenderness.  Lymphadenopathy:    She has no cervical adenopathy.  Neurological: She is alert and oriented to person, place, and time. Gait normal.  Skin: Skin is warm and dry. No rash noted. No erythema. No pallor.  Psychiatric: Affect normal.    LABORATORY DATA:. Appointment on 09/26/2015  Component Date Value Ref Range Status  . Protime 09/26/2015 33.6* 10.6 - 13.4 Seconds Final  . INR 09/26/2015 2.80  2.00 - 3.50 Final   Comment: INR is useful only to assess adequacy of anticoagulation with coumadin when comparing results from different labs. It should not be used to estimate bleeding risk or presence/abscense of coagulopathy in patients not on coumadin. Expected INR ranges for  nontherapeutic patients is 0.88 - 1.12.   Marland Kitchen Lovenox 09/26/2015 No   Final  Appointment on 09/24/2015  Component Date Value  Ref Range Status  . WBC 09/24/2015 4.7  3.9 - 10.3 10e3/uL Final  . NEUT# 09/24/2015 2.0  1.5 - 6.5 10e3/uL Final  . HGB 09/24/2015 10.1* 11.6 - 15.9 g/dL Final  . HCT 09/24/2015 32.0* 34.8 - 46.6 % Final  . Platelets 09/24/2015 201  145 - 400 10e3/uL Final  . MCV 09/24/2015 95.6  79.5 - 101.0 fL Final  . MCH 09/24/2015 30.3  25.1 - 34.0 pg Final  . MCHC 09/24/2015 31.7  31.5 - 36.0 g/dL Final  . RBC 09/24/2015 3.35* 3.70 - 5.45 10e6/uL Final  . RDW 09/24/2015 22.9* 11.2 - 14.5 % Final  . lymph# 09/24/2015 1.9  0.9 - 3.3 10e3/uL Final  . MONO# 09/24/2015 0.5  0.1 - 0.9 10e3/uL Final  . Eosinophils Absolute 09/24/2015 0.2  0.0 - 0.5 10e3/uL Final  . Basophils Absolute 09/24/2015 0.0  0.0 - 0.1 10e3/uL Final  . NEUT% 09/24/2015 42.4  38.4 - 76.8 % Final  . LYMPH% 09/24/2015 41.3  14.0 - 49.7 % Final  . MONO% 09/24/2015 10.8  0.0 - 14.0 % Final  . EOS% 09/24/2015 4.6  0.0 - 7.0 % Final  . BASO% 09/24/2015 0.9  0.0 - 2.0 % Final  . Sodium 09/24/2015 139  136 - 145 mEq/L Final  . Potassium 09/24/2015 5.4* 3.5 - 5.1 mEq/L Final  . Chloride 09/24/2015 106  98 - 109 mEq/L Final  . CO2 09/24/2015 22  22 - 29 mEq/L Final  . Glucose 09/24/2015 261* 70 - 140 mg/dl Final   Glucose reference range is for nonfasting patients. Fasting glucose reference range is 70- 100.  Marland Kitchen BUN 09/24/2015 16.4  7.0 - 26.0 mg/dL Final  . Creatinine 09/24/2015 1.0  0.6 - 1.1 mg/dL Final  . Total Bilirubin 09/24/2015 0.41  0.20 - 1.20 mg/dL Final  . Alkaline Phosphatase 09/24/2015 72  40 - 150 U/L Final  . AST 09/24/2015 10  5 - 34 U/L Final  . ALT  09/24/2015 9  0 - 55 U/L Final  . Total Protein 09/24/2015 7.0  6.4 - 8.3 g/dL Final  . Albumin 09/24/2015 3.4* 3.5 - 5.0 g/dL Final  . Calcium 09/24/2015 10.0  8.4 - 10.4 mg/dL Final  . Anion Gap 09/24/2015 10  3 - 11 mEq/L Final  . EGFR 09/24/2015 57* >90 ml/min/1.73 m2 Final   eGFR is calculated using the CKD-EPI Creatinine Equation (2009)  . Protime 09/24/2015 52.8*  10.6 - 13.4 Seconds Final  . INR 09/24/2015 4.40* 2.00 - 3.50 Final   Comment: INR is useful only to assess adequacy of anticoagulation with coumadin when comparing results from different labs. It should not be used to estimate bleeding risk or presence/abscense of coagulopathy in patients not on coumadin. Expected INR ranges for  nontherapeutic patients is 0.88 - 1.12.   Marland Kitchen Lovenox 09/24/2015 No   Final     RADIOGRAPHIC STUDIES: Ct Head Wo Contrast  09/24/2015  CLINICAL DATA:  82 year old hypertensive female with headaches. Hit top of head during fall 09/20/2015. Pancreatic cancer diagnosed January 2017 post completion of chemotherapy. Initial encounter. EXAM: CT HEAD WITHOUT CONTRAST TECHNIQUE: Contiguous axial images were obtained from the base of the skull through the vertex without intravenous contrast. COMPARISON:  10/20/2015 head CT. FINDINGS: Soft tissue swelling posterior convexity without underlying fracture or intracranial hemorrhage. Global atrophy without hydrocephalus. No intracranial mass lesion noted on this unenhanced exam. No CT evidence of large acute infarct. Vascular calcifications. Post right lens replacement otherwise orbital structures unremarkable. Mastoid air cells, middle ear cavities and visualized paranasal sinuses are clear. IMPRESSION: Soft tissue swelling posterior convexity without underlying fracture or intracranial hemorrhage. Electronically Signed   By: Genia Del M.D.   On: 09/24/2015 14:46    ASSESSMENT/PLAN:    Pancreatic cancer [facility]) Patient has been taking Xeloda oral therapy as directed.  She started her cycle 2 of the Xeloda on Monday, 09/17/2015.  She has increased the Xeloda to 1500 mg in the morning and 1000 mg in the evening per directions.  She confirmed that she has been taking the Xeloda 14 days on and 7 days off with each cycle.  Will change pt's next labs and visit to 10/04/15 for patient's convenience.         Long term current use of  anticoagulant therapy Patient's INR when checked on Monday, 09/24/2015 was increased to 4.4.  Patient has been holding her Coumadin and over since.  Recheck of patient's INR today was 2.8.  Patient was advised to resume her Coumadin per previous directions.  Patient will return on 10/04/2015 for labs and a visit.  We will recheck INR at that time.  Fall at home Patient states that the bruise to the crown of her head is slowly improving.  She states she no longer is having any headaches whatsoever.  She also denies any other neurological symptoms.  Exam today reveals healing bruising to the crown of patient's head and her right temple.    Hypotension Blood pressure on initial check was 96/69.  Recheck of blood pressure revealed blood pressure 98/86.  Patient states he has a history of hypertension; has been taking lisinopril 20 mg per day as directed.  Patient was complaining of increasing fatigue/weakness; and fell out of the shower at the end of last week.  Patient was advised that she may be experiencing some hypotension secondary to the lisinopril.  She was advised to hold the lisinopril for the next 2 days; and to keep a  record of her blood pressure when she is at home.  Will recheck patient's blood pressure when she returns to the [place] this coming Wednesday, 09/26/2015. ____________________________________________  Update: Patient has been holding the lisinopril as directed; and blood pressure has improved to 131/83 with recheck today.  However, patient does report one episode of hypertension within the past 48 hours.  Advised patient to resume taking her lisinopril as previously directed; and to follow back up with her primary care physician Dr. Rex Kras for further instructions regarding her blood pressure medication.      Patient stated understanding of all instructions; and was in agreement with this plan of care. The patient knows to call the clinic with any problems,  questions or concerns.   Total time spent with patient was 25 minutes;  with greater than 75 percent of that time spent in face to face counseling regarding patient's symptoms,  and coordination of care and follow up.  Disclaimer:This dictation was prepared with Dragon/digital dictation along with [employer]. Any transcriptional errors that result from this process are unintentional.  Drue Second, NP 09/26/2015

## 2015-09-27 ENCOUNTER — Telehealth: Payer: Self-pay | Admitting: *Deleted

## 2015-09-27 NOTE — Telephone Encounter (Signed)
Spoke to pt husband. Pt is resting this afternoon. She has been keeping a log of her blood pressures. Husband reports they are still running low. Pt has not called Dr. Rex Kras to follow up on hypertension medications. Pt has continued to hold blood pressure medications.   Called Dr. Eddie Dibbles office to have them contact the patient to schedule an appt. [phone]

## 2015-10-04 ENCOUNTER — Ambulatory Visit (HOSPITAL_BASED_OUTPATIENT_CLINIC_OR_DEPARTMENT_OTHER): Payer: Medicare Other | Admitting: Nurse Practitioner

## 2015-10-04 ENCOUNTER — Encounter: Payer: Self-pay | Admitting: Nurse Practitioner

## 2015-10-04 ENCOUNTER — Telehealth: Payer: Self-pay | Admitting: Nurse Practitioner

## 2015-10-04 ENCOUNTER — Other Ambulatory Visit (HOSPITAL_BASED_OUTPATIENT_CLINIC_OR_DEPARTMENT_OTHER): Payer: Medicare Other

## 2015-10-04 ENCOUNTER — Other Ambulatory Visit: Payer: Self-pay | Admitting: Nurse Practitioner

## 2015-10-04 VITALS — BP 130/80 | HR 88 | Temp 97.7°F | Resp 17 | Ht 66.5 in | Wt 131.4 lb

## 2015-10-04 DIAGNOSIS — W19XXXD Unspecified fall, subsequent encounter: Secondary | ICD-10-CM

## 2015-10-04 DIAGNOSIS — Y92009 Unspecified place in unspecified non-institutional (private) residence as the place of occurrence of the external cause: Secondary | ICD-10-CM

## 2015-10-04 DIAGNOSIS — C259 Malignant neoplasm of pancreas, unspecified: Secondary | ICD-10-CM

## 2015-10-04 DIAGNOSIS — I959 Hypotension, unspecified: Secondary | ICD-10-CM | POA: Diagnosis not present

## 2015-10-04 DIAGNOSIS — Z7901 Long term (current) use of anticoagulants: Secondary | ICD-10-CM | POA: Diagnosis not present

## 2015-10-04 DIAGNOSIS — C251 Malignant neoplasm of body of pancreas: Secondary | ICD-10-CM

## 2015-10-04 DIAGNOSIS — R42 Dizziness and giddiness: Secondary | ICD-10-CM

## 2015-10-04 LAB — CBC WITH DIFFERENTIAL/PLATELET
BASO%: 0.6 % (ref 0.0–2.0)
BASOS ABS: 0 10*3/uL (ref 0.0–0.1)
EOS%: 3.5 % (ref 0.0–7.0)
Eosinophils Absolute: 0.2 10*3/uL (ref 0.0–0.5)
HEMATOCRIT: 33.3 % — AB (ref 34.8–46.6)
HEMOGLOBIN: 10.7 g/dL — AB (ref 11.6–15.9)
LYMPH#: 2.5 10*3/uL (ref 0.9–3.3)
LYMPH%: 35.8 % (ref 14.0–49.7)
MCH: 31.3 pg (ref 25.1–34.0)
MCHC: 32.2 g/dL (ref 31.5–36.0)
MCV: 97.2 fL (ref 79.5–101.0)
MONO#: 0.8 10*3/uL (ref 0.1–0.9)
MONO%: 11.3 % (ref 0.0–14.0)
NEUT%: 48.8 % (ref 38.4–76.8)
NEUTROS ABS: 3.4 10*3/uL (ref 1.5–6.5)
Platelets: 238 10*3/uL (ref 145–400)
RBC: 3.43 10*6/uL — ABNORMAL LOW (ref 3.70–5.45)
RDW: 24.6 % — ABNORMAL HIGH (ref 11.2–14.5)
WBC: 6.9 10*3/uL (ref 3.9–10.3)

## 2015-10-04 LAB — COMPREHENSIVE METABOLIC PANEL
AST: 8 U/L (ref 5–34)
Albumin: 3.5 g/dL (ref 3.5–5.0)
Alkaline Phosphatase: 83 U/L (ref 40–150)
Anion Gap: 13 mEq/L — ABNORMAL HIGH (ref 3–11)
BUN: 16.2 mg/dL (ref 7.0–26.0)
CALCIUM: 9.9 mg/dL (ref 8.4–10.4)
CHLORIDE: 103 meq/L (ref 98–109)
CO2: 21 mEq/L — ABNORMAL LOW (ref 22–29)
CREATININE: 1 mg/dL (ref 0.6–1.1)
EGFR: 57 mL/min/{1.73_m2} — ABNORMAL LOW (ref >90)
Glucose: 202 mg/dl — ABNORMAL HIGH (ref 70–140)
Potassium: 4 mEq/L (ref 3.5–5.1)
Sodium: 137 mEq/L (ref 136–145)
TOTAL PROTEIN: 7.2 g/dL (ref 6.4–8.3)
Total Bilirubin: 0.36 mg/dL (ref 0.20–1.20)

## 2015-10-04 LAB — PROTIME-INR
INR: 2.5 (ref 2.00–3.50)
PROTIME: 30 s — AB (ref 10.6–13.4)

## 2015-10-04 NOTE — Assessment & Plan Note (Signed)
Patient has been taking Xeloda oral therapy as directed.  She started her cycle 2 of the Xeloda on Monday, 09/17/2015.  She has increased the Xeloda to 1500 mg in the morning and 1000 mg in the evening per directions.  She confirmed that she has been taking the Xeloda 14 days on and 7 days off with each cycle.  Patient is currently enjoying her week off from the Xeloda oral therapy.  Patient states that she has been taking the Xeloda 3 tablets in the morning and 2 tablets in the evening.  She states that she tolerates the 2 tablets.  Much easier than the 3 tablets of the Xeloda oral therapy.  Patient was instructed to try switching it; and will now try taking the Xeloda 2 tablets in the morning and 3 tablets in the evening in hopes of reducing any nausea or other side effects.  Patient is requesting to hold initiating her third cycle of the Xeloda oral therapy for 1 week so that she can enjoy her granddaughter's graduation celebration.    Therefore-patient will initiate her third cycle of Xeloda oral therapy on 10/15/2015.  She will continue with weekly labs; including a check of her INR.  Patient has requested to hold all appointments at the [place] for the week of 10/08/2015 so that she can enjoy her family celebrations.  Patient will be scheduled for restaging scan on 10/29/2015.  She will need to be scheduled for labs and a follow-up visit with Dr. Burr Medico on 10/30/2015.

## 2015-10-04 NOTE — Telephone Encounter (Signed)
Called and spoke to patient regarding her lab results.  Advised patient that her potassium and all other labs were within normal limits now.  Also, advised patient that I tried to call her daughter to review lab results as well; but there was no answer.  I left a message on the daughter's cell phone voicemail.

## 2015-10-04 NOTE — Progress Notes (Signed)
SYMPTOM MANAGEMENT CLINIC    Chief Complaint: Follow-up of coagulation and hypotension.  HPI:  Nicole Sherman 82 y.o. female diagnosed with pancreatic cancer.  Currently undergoing Xeloda oral therapy.  Patient has been taking Xeloda oral therapy as directed.  She started her cycle 2 of the Xeloda on Monday, 09/17/2015.  She has increased the Xeloda to 1500 mg in the morning and 1000 mg in the evening per directions.  She confirmed that she has been taking the Xeloda 14 days on and 7 days off with each cycle.  Patient is currently enjoying her week off from the Xeloda oral therapy.  Patient states that she has been taking the Xeloda 3 tablets in the morning and 2 tablets in the evening.  She states that she tolerates the 2 tablets.  Much easier than the 3 tablets of the Xeloda oral therapy.  Patient was instructed to try switching it; and will now try taking the Xeloda 2 tablets in the morning and 3 tablets in the evening in hopes of reducing any nausea or other side effects.  Patient is requesting to hold initiating her third cycle of the Xeloda oral therapy for 1 week so that she can enjoy her granddaughter's graduation celebration.    Therefore-patient will initiate her third cycle of Xeloda oral therapy on 10/15/2015.  She will continue with weekly labs; including a check of her INR.  Patient has requested to hold all appointments at the [facility] for the week of 10/08/2015 so that she can enjoy her family celebrations.  Patient will be scheduled for restaging scan on 10/29/2015.  She will need to be scheduled for labs and a follow-up visit with Dr. Burr Medico on 10/30/2015.   Oncology History   Pancreatic cancer [facility])   Staging form: Pancreas, AJCC 7th Edition     Clinical stage from 05/27/2014: Stage IIB (T2, N1, M0) - Signed by [name], MD on 05/31/2015       Pancreatic cancer ([place])   05/16/2015 Imaging CT chest, abdomen and pelvis with contrast showed a bulky mass at pancreatic  neck/body, tumor encases the celiac and proximal branches and a cruise the main portal vein, peripancreatic adenopathy. no distant mets.    05/28/2015 Initial Biopsy Pancreatic mass needle biopsy showed well differentiated adenocarcinoma   05/31/2015 Initial Diagnosis Pancreatic cancer ([place])   05/31/2015 Tumor Marker CA19.9 =375   06/05/2015 - 07/03/2015 Chemotherapy weekly gemcitabine 106m/m2, dose reduced to 8581mm2 on week 2 due to tolerance issue   08/27/2015 -  Chemotherapy xeloda 100015mid, 14 days on, 7 days off    Review of Systems  Constitutional: Positive for malaise/fatigue.  All other systems reviewed and are negative.   Past Medical History  Diagnosis Date  . Hypertension   . Hypothyroidism   . Diabetes mellitus without complication ([place] . Arthritis     osteoarthritis-hands wrist  . Diverticulosis     showing on CT of abdomen 05-16-15  . [facility]   pancreatic mass- dx. adenocarcinoma" CT abdomen 05-16-15    Past Surgical History  Procedure Laterality Date  . Joint replacement Right   . Shoulder arthroscopy w/ rotator cuff repair Left   . Shoulder open rotator cuff repair Right   . Tubal ligation    . Appendectomy      open  . Ankle surgery Right   . Eye surgery      macular hole surgery repair  . Cataract extraction Right   .  Eus N/A 05/28/2015    Procedure: UPPER ENDOSCOPIC ULTRASOUND (EUS) LINEAR;  Surgeon: Arta Silence, MD;  Location: [facility] ENDOSCOPY;  Service: Endoscopy;  Laterality: N/A;    has Pancreatic cancer ([place]); Long term current use of anticoagulant therapy; Acute deep vein thrombosis (DVT) of distal vein of left lower extremity ([place]); Hypothyroidism; Hypertension; Diabetes mellitus without complication ([place]); Fall at home; and Hypotension on her problem list.    is allergic to keflex and aspirin.    Medication List       This list is accurate as of: 10/04/15  6:55 PM.  Always use your most recent med list.               capecitabine  500 MG tablet  Commonly known as:  XELODA  Take 3 tablets (1,500 mg total) by mouth 2 (two) times daily after a meal. 2 weeks on and 1 week off     levothyroxine 100 MCG tablet  Commonly known as:  SYNTHROID, LEVOTHROID  Take 100 mcg by mouth daily before breakfast.     lisinopril 20 MG tablet  Commonly known as:  PRINIVIL,ZESTRIL  Take 20 mg by mouth daily.     metFORMIN 1000 MG tablet  Commonly known as:  GLUCOPHAGE  Take 1,000 mg by mouth 2 (two) times daily with a meal.     omeprazole 20 MG tablet  Commonly known as:  PRILOSEC OTC  Take 20 mg by mouth daily as needed (acid reflux). Reported on 07/19/2015     ondansetron 8 MG tablet  Commonly known as:  ZOFRAN  Take 1 tablet (8 mg total) by mouth 2 (two) times daily as needed (Nausea or vomiting).     traMADol 50 MG tablet  Commonly known as:  ULTRAM  Take 1 tablet (50 mg total) by mouth every 6 (six) hours as needed.     warfarin 1 MG tablet  Commonly known as:  COUMADIN  Take 1 tablet (1 mg total) by mouth daily.     warfarin 2 MG tablet  Commonly known as:  COUMADIN  Take 1 tablet (2 mg total) by mouth daily.         PHYSICAL EXAMINATION  Oncology Vitals 10/04/2015 09/26/2015  Height 169 cm 169 cm  Weight 59.603 kg 59.875 kg  Weight (lbs) 131 lbs 6 oz 132 lbs  BMI (kg/m2) 20.89 kg/m2 20.99 kg/m2  Temp 97.7 98.2  Pulse 88 85  Resp 17 18  SpO2 97 95  BSA (m2) 1.67 m2 1.68 m2   BP Readings from Last 2 Encounters:  10/04/15 130/80  09/26/15 131/83    Physical Exam  Constitutional: She is oriented to person, place, and time and well-developed, well-nourished, and in no distress.  HENT:  Head: Normocephalic.  Healing hematoma to the top of the scalp into the right temple region.  Eyes: Conjunctivae and EOM are normal. Pupils are equal, round, and reactive to light. Right eye exhibits no discharge. Left eye exhibits no discharge. No scleral icterus.  Neck: Normal range of motion.  Pulmonary/Chest: Effort  normal. No respiratory distress.  Musculoskeletal: Normal range of motion. She exhibits no edema or tenderness.  Neurological: She is alert and oriented to person, place, and time. Gait normal.  Skin: Skin is warm and dry. No rash noted. No erythema. No pallor.  See trauma note regarding healing hematomas.  Psychiatric: Affect normal.    LABORATORY DATA:. Appointment on 10/04/2015  Component Date Value Ref Range Status  . WBC 10/04/2015 6.9  3.9 - 10.3 10e3/uL Final  . NEUT# 10/04/2015 3.4  1.5 - 6.5 10e3/uL Final  . HGB 10/04/2015 10.7* 11.6 - 15.9 g/dL Final  . HCT 10/04/2015 33.3* 34.8 - 46.6 % Final  . Platelets 10/04/2015 238  145 - 400 10e3/uL Final  . MCV 10/04/2015 97.2  79.5 - 101.0 fL Final  . MCH 10/04/2015 31.3  25.1 - 34.0 pg Final  . MCHC 10/04/2015 32.2  31.5 - 36.0 g/dL Final  . RBC 10/04/2015 3.43* 3.70 - 5.45 10e6/uL Final  . RDW 10/04/2015 24.6* 11.2 - 14.5 % Final  . lymph# 10/04/2015 2.5  0.9 - 3.3 10e3/uL Final  . MONO# 10/04/2015 0.8  0.1 - 0.9 10e3/uL Final  . Eosinophils Absolute 10/04/2015 0.2  0.0 - 0.5 10e3/uL Final  . Basophils Absolute 10/04/2015 0.0  0.0 - 0.1 10e3/uL Final  . NEUT% 10/04/2015 48.8  38.4 - 76.8 % Final  . LYMPH% 10/04/2015 35.8  14.0 - 49.7 % Final  . MONO% 10/04/2015 11.3  0.0 - 14.0 % Final  . EOS% 10/04/2015 3.5  0.0 - 7.0 % Final  . BASO% 10/04/2015 0.6  0.0 - 2.0 % Final  . Sodium 10/04/2015 137  136 - 145 mEq/L Final  . Potassium 10/04/2015 4.0  3.5 - 5.1 mEq/L Final  . Chloride 10/04/2015 103  98 - 109 mEq/L Final  . CO2 10/04/2015 21* 22 - 29 mEq/L Final  . Glucose 10/04/2015 202* 70 - 140 mg/dl Final   Glucose reference range is for nonfasting patients. Fasting glucose reference range is 70- 100.  Marland Kitchen BUN 10/04/2015 16.2  7.0 - 26.0 mg/dL Final  . Creatinine 10/04/2015 1.0  0.6 - 1.1 mg/dL Final  . Total Bilirubin 10/04/2015 0.36  0.20 - 1.20 mg/dL Final  . Alkaline Phosphatase 10/04/2015 83  40 - 150 U/L Final  . AST  10/04/2015 8  5 - 34 U/L Final  . ALT 10/04/2015 <9  0 - 55 U/L Final  . Total Protein 10/04/2015 7.2  6.4 - 8.3 g/dL Final  . Albumin 10/04/2015 3.5  3.5 - 5.0 g/dL Final  . Calcium 10/04/2015 9.9  8.4 - 10.4 mg/dL Final  . Anion Gap 10/04/2015 13* 3 - 11 mEq/L Final  . EGFR 10/04/2015 57* >90 ml/min/1.73 m2 Final   eGFR is calculated using the CKD-EPI Creatinine Equation (2009)  . Protime 10/04/2015 30.0* 10.6 - 13.4 Seconds Final  . INR 10/04/2015 2.50  2.00 - 3.50 Final   Comment: INR is useful only to assess adequacy of anticoagulation with coumadin when comparing results from different labs. It should not be used to estimate bleeding risk or presence/abscense of coagulopathy in patients not on coumadin. Expected INR ranges for  nontherapeutic patients is 0.88 - 1.12.   Marland Kitchen Lovenox 10/04/2015 No   Final    RADIOGRAPHIC STUDIES: No results found.  ASSESSMENT/PLAN:    Pancreatic cancer [facility]) Patient has been taking Xeloda oral therapy as directed.  She started her cycle 2 of the Xeloda on Monday, 09/17/2015.  She has increased the Xeloda to 1500 mg in the morning and 1000 mg in the evening per directions.  She confirmed that she has been taking the Xeloda 14 days on and 7 days off with each cycle.  Patient is currently enjoying her week off from the Xeloda oral therapy.  Patient states that she has been taking the Xeloda 3 tablets in the morning and 2 tablets in the evening.  She states that she tolerates the  2 tablets.  Much easier than the 3 tablets of the Xeloda oral therapy.  Patient was instructed to try switching it; and will now try taking the Xeloda 2 tablets in the morning and 3 tablets in the evening in hopes of reducing any nausea or other side effects.  Patient is requesting to hold initiating her third cycle of the Xeloda oral therapy for 1 week so that she can enjoy her granddaughter's graduation celebration.    Therefore-patient will initiate her third cycle of Xeloda  oral therapy on 10/15/2015.  She will continue with weekly labs; including a check of her INR.  Patient has requested to hold all appointments at the [facility] for the week of 10/08/2015 so that she can enjoy her family celebrations.  Patient will be scheduled for restaging scan on 10/29/2015.  She will need to be scheduled for labs and a follow-up visit with Dr. Burr Medico on 10/30/2015.             Long term current use of anticoagulant therapy Patient continues to take Coumadin as directed due to a history of a left lower extremity DVT in the past.  INR today was 2.5.  Patient was advised to continue with Coumadin as directed.  Since patient is planning to hold the initiation of her next cycle of Xeloda oral therapy for 1 week-advised both patient and her husband that patient should continue with the "off chemo tablet" Coumadin regimen.  Will continue to obtain INRs as previously stated.  Fall at home Patient states that the hematoma to the top of her scalp into the right temple area are slowly resolving.  She continues to deny any new neurological issues.  Exam today reveals resolving hematoma to the top of patient's scalp and to the right temple region.  Hypotension Patient states that she has continued holding all lisinopril due to issues with hypotension.  She states that she did follow-up with her primary care physician, Dr. Rex Kras; and he was in agreement with holding the lisinopril.  Blood pressure today was 130/80.  Patient also continues to monitor blood pressure at home.   Patient stated understanding of all instructions; and was in agreement with this plan of care. The patient knows to call the clinic with any problems, questions or concerns.   Total time spent with patient was 25 minutes;  with greater than 75 percent of that time spent in face to face counseling regarding patient's symptoms,  and coordination of care and follow up.  Disclaimer:This dictation was  prepared with Dragon/digital dictation along with [employer]. Any transcriptional errors that result from this process are unintentional.  Drue Second, NP 10/04/2015   Attending addendum:  I have seen the patient, examined her. I agree with the assessment and and plan and have edited the notes.  Her INR is therapeutic today, we'll continue current Coumadin dose OK to postpone her next cycle of Xeloda for 1 week I will see her back on 6/27   [name]

## 2015-10-04 NOTE — Assessment & Plan Note (Signed)
Patient states that she has continued holding all lisinopril due to issues with hypotension.  She states that she did follow-up with her primary care physician, Dr. Rex Kras; and he was in agreement with holding the lisinopril.  Blood pressure today was 130/80.  Patient also continues to monitor blood pressure at home.

## 2015-10-04 NOTE — Assessment & Plan Note (Signed)
Patient states that the hematoma to the top of her scalp into the right temple area are slowly resolving.  She continues to deny any new neurological issues.  Exam today reveals resolving hematoma to the top of patient's scalp and to the right temple region.

## 2015-10-04 NOTE — Assessment & Plan Note (Signed)
Patient continues to take Coumadin as directed due to a history of a left lower extremity DVT in the past.  INR today was 2.5.  Patient was advised to continue with Coumadin as directed.  Since patient is planning to hold the initiation of her next cycle of Xeloda oral therapy for 1 week-advised both patient and her husband that patient should continue with the "off chemo tablet" Coumadin regimen.  Will continue to obtain INRs as previously stated.

## 2015-10-05 ENCOUNTER — Other Ambulatory Visit: Payer: Self-pay | Admitting: Hematology

## 2015-10-05 ENCOUNTER — Telehealth: Payer: Self-pay

## 2015-10-05 ENCOUNTER — Telehealth: Payer: Self-pay | Admitting: Hematology

## 2015-10-05 MED ORDER — CAPECITABINE 500 MG PO TABS: ORAL_TABLET | ORAL | Status: DC

## 2015-10-05 MED FILL — CAPECITABINE 500 MG TABLET: 500 | 21 days supply | Qty: 70 | Fill #0

## 2015-10-05 NOTE — Telephone Encounter (Signed)
spoke w/ pt confirmed june apts

## 2015-10-05 NOTE — Telephone Encounter (Signed)
Daughter called for refill on xeloda. The pt is to resume taking xeloda on 6/12. Rx to [facility] outpatient pharmacy please. Call when sent or ready for pickup please.

## 2015-10-08 ENCOUNTER — Other Ambulatory Visit: Payer: Medicare Other

## 2015-10-08 ENCOUNTER — Ambulatory Visit: Payer: Medicare Other | Admitting: Hematology

## 2015-10-10 ENCOUNTER — Other Ambulatory Visit: Payer: Self-pay | Admitting: *Deleted

## 2015-10-10 DIAGNOSIS — C251 Malignant neoplasm of body of pancreas: Secondary | ICD-10-CM

## 2015-10-10 MED ORDER — ONDANSETRON HCL 8 MG PO TABS: 8.0000 mg | ORAL_TABLET | Freq: Two times a day (BID) | ORAL | Status: DC | PRN

## 2015-10-10 MED FILL — ONDANSETRON HCL 8 MG TABLET: 8 | 15 days supply | Qty: 30 | Fill #0

## 2015-10-15 ENCOUNTER — Other Ambulatory Visit (HOSPITAL_COMMUNITY)
Admission: AD | Admit: 2015-10-15 | Discharge: 2015-10-15 | Disposition: A | Discharge status: Home / Self Care | Payer: Medicare Other | Source: Ambulatory Visit | Attending: Hematology | Admitting: Hematology

## 2015-10-15 ENCOUNTER — Encounter: Payer: Self-pay | Admitting: *Deleted

## 2015-10-15 ENCOUNTER — Other Ambulatory Visit (HOSPITAL_BASED_OUTPATIENT_CLINIC_OR_DEPARTMENT_OTHER): Payer: Medicare Other

## 2015-10-15 ENCOUNTER — Telehealth: Payer: Self-pay

## 2015-10-15 ENCOUNTER — Other Ambulatory Visit: Payer: Self-pay | Admitting: *Deleted

## 2015-10-15 ENCOUNTER — Telehealth: Payer: Self-pay | Admitting: *Deleted

## 2015-10-15 ENCOUNTER — Telehealth: Payer: Self-pay | Admitting: Hematology

## 2015-10-15 DIAGNOSIS — R195 Other fecal abnormalities: Secondary | ICD-10-CM

## 2015-10-15 DIAGNOSIS — Z7901 Long term (current) use of anticoagulants: Secondary | ICD-10-CM | POA: Insufficient documentation

## 2015-10-15 DIAGNOSIS — C251 Malignant neoplasm of body of pancreas: Secondary | ICD-10-CM

## 2015-10-15 DIAGNOSIS — I824Z2 Acute embolism and thrombosis of unspecified deep veins of left distal lower extremity: Secondary | ICD-10-CM

## 2015-10-15 DIAGNOSIS — Y92009 Unspecified place in unspecified non-institutional (private) residence as the place of occurrence of the external cause: Secondary | ICD-10-CM

## 2015-10-15 DIAGNOSIS — C259 Malignant neoplasm of pancreas, unspecified: Secondary | ICD-10-CM

## 2015-10-15 DIAGNOSIS — I959 Hypotension, unspecified: Secondary | ICD-10-CM

## 2015-10-15 DIAGNOSIS — W19XXXD Unspecified fall, subsequent encounter: Secondary | ICD-10-CM

## 2015-10-15 LAB — PROTIME-INR
INR: 8.94 (ref 0.00–1.49)
PROTHROMBIN TIME: 69.6 s — AB (ref 11.6–15.2)

## 2015-10-15 LAB — COMPREHENSIVE METABOLIC PANEL
ALBUMIN: 3.1 g/dL — AB (ref 3.5–5.0)
ALK PHOS: 102 U/L (ref 40–150)
ALT: 10 U/L (ref 0–55)
AST: 13 U/L (ref 5–34)
Anion Gap: 10 mEq/L (ref 3–11)
BILIRUBIN TOTAL: 0.37 mg/dL (ref 0.20–1.20)
BUN: 17.1 mg/dL (ref 7.0–26.0)
CO2: 23 mEq/L (ref 22–29)
CREATININE: 0.9 mg/dL (ref 0.6–1.1)
Calcium: 9.2 mg/dL (ref 8.4–10.4)
Chloride: 105 mEq/L (ref 98–109)
EGFR: 61 mL/min/{1.73_m2} — AB (ref >90)
GLUCOSE: 225 mg/dL — AB (ref 70–140)
Potassium: 3.6 mEq/L (ref 3.5–5.1)
SODIUM: 138 meq/L (ref 136–145)
TOTAL PROTEIN: 6.7 g/dL (ref 6.4–8.3)

## 2015-10-15 LAB — CBC WITH DIFFERENTIAL/PLATELET
BASO%: 0.2 % (ref 0.0–2.0)
Basophils Absolute: 0 10*3/uL (ref 0.0–0.1)
EOS ABS: 0.2 10*3/uL (ref 0.0–0.5)
EOS%: 3.4 % (ref 0.0–7.0)
HCT: 30.8 % — ABNORMAL LOW (ref 34.8–46.6)
HEMOGLOBIN: 9.8 g/dL — AB (ref 11.6–15.9)
LYMPH%: 30.2 % (ref 14.0–49.7)
MCH: 30.4 pg (ref 25.1–34.0)
MCHC: 31.8 g/dL (ref 31.5–36.0)
MCV: 95.7 fL (ref 79.5–101.0)
MONO#: 0.7 10*3/uL (ref 0.1–0.9)
MONO%: 12.1 % (ref 0.0–14.0)
NEUT%: 54.1 % (ref 38.4–76.8)
NEUTROS ABS: 3.1 10*3/uL (ref 1.5–6.5)
PLATELETS: 205 10*3/uL (ref 145–400)
RBC: 3.22 10*6/uL — ABNORMAL LOW (ref 3.70–5.45)
RDW: 21.8 % — AB (ref 11.2–14.5)
WBC: 5.6 10*3/uL (ref 3.9–10.3)
lymph#: 1.7 10*3/uL (ref 0.9–3.3)

## 2015-10-15 NOTE — Telephone Encounter (Signed)
spoke w/ pt confirmed 6/15 lab

## 2015-10-15 NOTE — Progress Notes (Unsigned)
Panic INR results given to Dr. Burr Medico by J.Erikka Follmer

## 2015-10-15 NOTE — Telephone Encounter (Signed)
Daughter called stating her mother told her she has completely black stools. Called pt and she said not completely black stools, were darker yesterday and today. She denied tarry consistency, she denied SOB, increased fatigue or any other symptoms. She did remember eating beets the last day or 2. S/w cyndee Berniece Salines, added T&H to labs that are due today at 230. Instructed pt to stay in lobby for results of labs to be evaluated.

## 2015-10-15 NOTE — Telephone Encounter (Signed)
Informed pt & family-husband & daughter to hold coumadin due to elevated INR until back in office on thurs for recheck to see if she can resume.  Also given hemocult card to check stool & to bring with her on thurs.  Orders placed & scheduler should call her. No need for blood transfusion at this point.

## 2015-10-16 ENCOUNTER — Other Ambulatory Visit: Payer: Self-pay | Admitting: *Deleted

## 2015-10-16 LAB — CANCER ANTIGEN 19-9: CAN 19-9: 357 U/mL — AB (ref 0–35)

## 2015-10-17 ENCOUNTER — Telehealth: Payer: Self-pay | Admitting: Nurse Practitioner

## 2015-10-17 NOTE — Telephone Encounter (Signed)
left msg confirming 6/15 apt

## 2015-10-18 ENCOUNTER — Telehealth: Payer: Self-pay | Admitting: Nurse Practitioner

## 2015-10-18 ENCOUNTER — Other Ambulatory Visit (HOSPITAL_BASED_OUTPATIENT_CLINIC_OR_DEPARTMENT_OTHER): Payer: Medicare Other

## 2015-10-18 ENCOUNTER — Encounter: Payer: Self-pay | Admitting: *Deleted

## 2015-10-18 ENCOUNTER — Ambulatory Visit (HOSPITAL_BASED_OUTPATIENT_CLINIC_OR_DEPARTMENT_OTHER): Payer: Medicare Other

## 2015-10-18 ENCOUNTER — Other Ambulatory Visit (HOSPITAL_COMMUNITY)
Admission: AD | Admit: 2015-10-18 | Discharge: 2015-10-18 | Disposition: A | Discharge status: Home / Self Care | Payer: Medicare Other | Source: Ambulatory Visit | Attending: Hematology | Admitting: Hematology

## 2015-10-18 ENCOUNTER — Ambulatory Visit (HOSPITAL_BASED_OUTPATIENT_CLINIC_OR_DEPARTMENT_OTHER): Payer: Medicare Other | Admitting: Nurse Practitioner

## 2015-10-18 ENCOUNTER — Encounter: Payer: Self-pay | Admitting: Nurse Practitioner

## 2015-10-18 VITALS — BP 133/66 | HR 94 | Temp 98.3°F | Resp 18 | Ht 66.5 in | Wt 131.4 lb

## 2015-10-18 DIAGNOSIS — C259 Malignant neoplasm of pancreas, unspecified: Secondary | ICD-10-CM

## 2015-10-18 DIAGNOSIS — I959 Hypotension, unspecified: Secondary | ICD-10-CM

## 2015-10-18 DIAGNOSIS — C250 Malignant neoplasm of head of pancreas: Secondary | ICD-10-CM | POA: Diagnosis not present

## 2015-10-18 DIAGNOSIS — R195 Other fecal abnormalities: Secondary | ICD-10-CM

## 2015-10-18 DIAGNOSIS — C251 Malignant neoplasm of body of pancreas: Secondary | ICD-10-CM

## 2015-10-18 DIAGNOSIS — Y92009 Unspecified place in unspecified non-institutional (private) residence as the place of occurrence of the external cause: Secondary | ICD-10-CM

## 2015-10-18 DIAGNOSIS — Z7901 Long term (current) use of anticoagulants: Secondary | ICD-10-CM | POA: Diagnosis not present

## 2015-10-18 DIAGNOSIS — K625 Hemorrhage of anus and rectum: Secondary | ICD-10-CM

## 2015-10-18 DIAGNOSIS — W19XXXD Unspecified fall, subsequent encounter: Secondary | ICD-10-CM

## 2015-10-18 LAB — PROTIME-INR
INR: 5.64 — AB (ref 0.00–1.49)
PROTHROMBIN TIME: 48.7 s — AB (ref 11.6–15.2)

## 2015-10-18 LAB — CBC WITH DIFFERENTIAL/PLATELET
BASO%: 0.6 % (ref 0.0–2.0)
BASOS ABS: 0 10*3/uL (ref 0.0–0.1)
EOS ABS: 0.2 10*3/uL (ref 0.0–0.5)
EOS%: 2.9 % (ref 0.0–7.0)
HCT: 32.6 % — ABNORMAL LOW (ref 34.8–46.6)
HGB: 10.4 g/dL — ABNORMAL LOW (ref 11.6–15.9)
LYMPH%: 34.5 % (ref 14.0–49.7)
MCH: 30.6 pg (ref 25.1–34.0)
MCHC: 31.9 g/dL (ref 31.5–36.0)
MCV: 95.9 fL (ref 79.5–101.0)
MONO#: 0.6 10*3/uL (ref 0.1–0.9)
MONO%: 9.5 % (ref 0.0–14.0)
NEUT#: 3.4 10*3/uL (ref 1.5–6.5)
NEUT%: 52.5 % (ref 38.4–76.8)
Platelets: 230 10*3/uL (ref 145–400)
RBC: 3.4 10*6/uL — AB (ref 3.70–5.45)
RDW: 25.2 % — ABNORMAL HIGH (ref 11.2–14.5)
WBC: 6.4 10*3/uL (ref 3.9–10.3)
lymph#: 2.2 10*3/uL (ref 0.9–3.3)

## 2015-10-18 LAB — FECAL OCCULT BLOOD, GUAIAC: Occult Blood: POSITIVE

## 2015-10-18 NOTE — Telephone Encounter (Signed)
Pt is aware of apt per pof

## 2015-10-18 NOTE — Progress Notes (Unsigned)
Critical INR reported to [facility] by M. Nicole Kindred 10/18/15 1355

## 2015-10-19 ENCOUNTER — Encounter: Payer: Self-pay | Admitting: Nurse Practitioner

## 2015-10-19 ENCOUNTER — Other Ambulatory Visit: Payer: Self-pay | Admitting: Nurse Practitioner

## 2015-10-19 DIAGNOSIS — C251 Malignant neoplasm of body of pancreas: Secondary | ICD-10-CM

## 2015-10-19 DIAGNOSIS — Z7901 Long term (current) use of anticoagulants: Secondary | ICD-10-CM

## 2015-10-19 NOTE — Progress Notes (Signed)
SYMPTOM MANAGEMENT CLINIC    Chief Complaint: Anticoagulation check, rectal bleeding  HPI:  Nicole Sherman 82 y.o. female diagnosed with pancreatic cancer.  Currently undergoing Xeloda oral therapy.   Patient states that she had been noticing some darker stools earlier this week; but was attributing the darker stools to the fact that she had been eating some beets.  Her INR had been increased to 8.94 on Monday, 10/15/2015; and she was advised to hold off Coumadin until she was rechecked.  She was given a Hemoccult card to take home; and states that she obtained her stool sample on Tuesday, 10/16/2015.  She states that since that time,-her stool has become much more normal in color and she is seen.  No active bleeding.  She also denies any hemorrhoids or other issues in the perianal region.  Hemoccult card from stool sample on Tuesday, 10/16/2015 was tested and was found positive for blood.  Rectal exam performed today revealed no obvious internal or external hemorrhoids or rectal bleeding.  However, there was trace Hemoccult positive stool on exam.  Once again today.  Blood counts obtained today revealed that hemoglobin is excellent improved from 9.8 up to 10.4.  Also, platelet count is normal at 230.  Since patient's INR continues elevated at 5.64 today; patient was advised to continue holding the Coumadin altogether; and to monitor for any worsening issues with rectal bleeding/GI bleed whatsoever.  Also, reviewed all findings with on call physician, Dr. Benay Spice; and he was in agreement with this plan.  Patient was advised to directly to the emergency department for any worsening symptoms whatsoever.  Oncology History   Pancreatic cancer [facility])   Staging form: Pancreas, AJCC 7th Edition     Clinical stage from 05/27/2014: Stage IIB (T2, N1, M0) - Signed by [name], MD on 05/31/2015       Pancreatic cancer ([place])   05/16/2015 Imaging CT chest, abdomen and pelvis with contrast showed  a bulky mass at pancreatic neck/body, tumor encases the celiac and proximal branches and a cruise the main portal vein, peripancreatic adenopathy. no distant mets.    05/28/2015 Initial Biopsy Pancreatic mass needle biopsy showed well differentiated adenocarcinoma   05/31/2015 Initial Diagnosis Pancreatic cancer ([place])   05/31/2015 Tumor Marker CA19.9 =375   06/05/2015 - 07/03/2015 Chemotherapy weekly gemcitabine 1000mg /m2, dose reduced to 850mg /m2 on week 2 due to tolerance issue   08/27/2015 -  Chemotherapy xeloda 1000mg  bid, 14 days on, 7 days off    Review of Systems  Gastrointestinal: Positive for blood in stool.  All other systems reviewed and are negative.   Past Medical History  Diagnosis Date  . Hypertension   . Hypothyroidism   . Diabetes mellitus without complication ([place])   . Arthritis     osteoarthritis-hands wrist  . Diverticulosis     showing on CT of abdomen 05-16-15  . Cancer [facility])     pancreatic mass- dx. adenocarcinoma" CT abdomen 05-16-15    Past Surgical History  Procedure Laterality Date  . Joint replacement Right   . Shoulder arthroscopy w/ rotator cuff repair Left   . Shoulder open rotator cuff repair Right   . Tubal ligation    . Appendectomy      open  . Ankle surgery Right   . Eye surgery      macular hole surgery repair  . Cataract extraction Right   . Eus N/A 05/28/2015    Procedure: UPPER ENDOSCOPIC ULTRASOUND (EUS) LINEAR;  Surgeon: Arta Silence,  MD;  Location: [facility] ENDOSCOPY;  Service: Endoscopy;  Laterality: N/A;    has Pancreatic cancer ([place]); Long term current use of anticoagulant therapy; Acute deep vein thrombosis (DVT) of distal vein of left lower extremity ([place]); Hypothyroidism; Hypertension; Diabetes mellitus without complication ([place]); Hypotension; and Rectal bleeding on her problem list.    is allergic to keflex and aspirin.    Medication List       This list is accurate as of: 10/18/15 11:59 PM.  Always use your most recent med list.                 capecitabine 500 MG tablet  Commonly known as:  XELODA  2 weeks on and 1 week off     levothyroxine 100 MCG tablet  Commonly known as:  SYNTHROID, LEVOTHROID  Take 100 mcg by mouth daily before breakfast.     metFORMIN 1000 MG tablet  Commonly known as:  GLUCOPHAGE  Take 1,000 mg by mouth 2 (two) times daily with a meal.     omeprazole 20 MG tablet  Commonly known as:  PRILOSEC OTC  Take 20 mg by mouth daily as needed (acid reflux). Reported on 10/18/2015     ondansetron 8 MG tablet  Commonly known as:  ZOFRAN  Take 1 tablet (8 mg total) by mouth 2 (two) times daily as needed (Nausea or vomiting).     traMADol 50 MG tablet  Commonly known as:  ULTRAM  Take 1 tablet (50 mg total) by mouth every 6 (six) hours as needed.     warfarin 1 MG tablet  Commonly known as:  COUMADIN  Take 1 tablet (1 mg total) by mouth daily.     warfarin 2 MG tablet  Commonly known as:  COUMADIN  Take 1 tablet (2 mg total) by mouth daily.         PHYSICAL EXAMINATION  Oncology Vitals 10/18/2015 10/04/2015  Height 169 cm 169 cm  Weight 59.603 kg 59.603 kg  Weight (lbs) 131 lbs 6 oz 131 lbs 6 oz  BMI (kg/m2) 20.89 kg/m2 20.89 kg/m2  Temp 98.3 97.7  Pulse 94 88  Resp 18 17  SpO2 97 97  BSA (m2) 1.67 m2 1.67 m2   BP Readings from Last 2 Encounters:  10/18/15 133/66  10/04/15 130/80    Physical Exam  Constitutional: She is oriented to person, place, and time and well-developed, well-nourished, and in no distress.  HENT:  Head: Normocephalic and atraumatic.  Eyes: Conjunctivae and EOM are normal. Pupils are equal, round, and reactive to light. Right eye exhibits no discharge. Left eye exhibits no discharge. No scleral icterus.  Neck: Normal range of motion.  Pulmonary/Chest: Effort normal. No respiratory distress.  Genitourinary: Guaiac positive stool.  Musculoskeletal: Normal range of motion. She exhibits no edema or tenderness.  Neurological: She is alert and oriented  to person, place, and time. Gait normal.  Skin: Skin is warm and dry. There is pallor.  Psychiatric: Affect normal.  Nursing note and vitals reviewed.   LABORATORY DATA:. Appointment on 10/18/2015  Component Date Value Ref Range Status  . WBC 10/18/2015 6.4  3.9 - 10.3 10e3/uL Final  . NEUT# 10/18/2015 3.4  1.5 - 6.5 10e3/uL Final  . HGB 10/18/2015 10.4* 11.6 - 15.9 g/dL Final  . HCT 10/18/2015 32.6* 34.8 - 46.6 % Final  . Platelets 10/18/2015 230  145 - 400 10e3/uL Final  . MCV 10/18/2015 95.9  79.5 - 101.0 fL Final  . MCH 10/18/2015 30.6  25.1 - 34.0 pg Final  . MCHC 10/18/2015 31.9  31.5 - 36.0 g/dL Final  . RBC 10/18/2015 3.40* 3.70 - 5.45 10e6/uL Final  . RDW 10/18/2015 25.2* 11.2 - 14.5 % Final  . lymph# 10/18/2015 2.2  0.9 - 3.3 10e3/uL Final  . MONO# 10/18/2015 0.6  0.1 - 0.9 10e3/uL Final  . Eosinophils Absolute 10/18/2015 0.2  0.0 - 0.5 10e3/uL Final  . Basophils Absolute 10/18/2015 0.0  0.0 - 0.1 10e3/uL Final  . NEUT% 10/18/2015 52.5  38.4 - 76.8 % Final  . LYMPH% 10/18/2015 34.5  14.0 - 49.7 % Final  . MONO% 10/18/2015 9.5  0.0 - 14.0 % Final  . EOS% 10/18/2015 2.9  0.0 - 7.0 % Final  . BASO% 10/18/2015 0.6  0.0 - 2.0 % Final  Hospital Outpatient Visit on 10/18/2015  Component Date Value Ref Range Status  . Prothrombin Time 10/18/2015 48.7* 11.6 - 15.2 seconds Final  . INR 10/18/2015 5.64* 0.00 - 1.49 Final   Comment: SPECIMEN CHECKED FOR CLOTS REPEATED TO VERIFY Nicole Sherman MT 6.15.17 @ 1345 BY RICEJ SPECIMEN CHECKED FOR CLOTS REPEATED TO VERIFY CRITICAL RESULT CALLED TO, READ BACK BY AND VERIFIED WITH: Nicole Sherman MT 6.15.17 @ [id number] [place]   Appointment on 10/18/2015  Component Date Value Ref Range Status  . Protime 10/18/2015 Sent out for confirmation. See results in EPIC.  10.6 - 13.4 Seconds Final  . INR 10/18/2015 Sent out for confirmation. See results in EPIC.  2.00 - 3.50 Final   Comment: INR is useful only to assess adequacy of anticoagulation with  coumadin when comparing results from different labs. It should not be used to estimate bleeding risk or presence/abscense of coagulopathy in patients not on coumadin. Expected INR ranges for  nontherapeutic patients is 0.88 - 1.12.   Marland Kitchen Lovenox 10/18/2015 No   Final  . Occult Blood 10/18/2015 Positive x 1, only 1 sample submitted   Final    RADIOGRAPHIC STUDIES: No results found.  ASSESSMENT/PLAN:    Rectal bleeding Patient states that she had been noticing some darker stools earlier this week; but was attributing the darker stools to the fact that she had been eating some beets.  Her INR had been increased to 8.94 on Monday, 10/15/2015; and she was advised to hold off Coumadin until she was rechecked.  She was given a Hemoccult card to take home; and states that she obtained her stool sample on Tuesday, 10/16/2015.  She states that since that time,-her stool has become much more normal in color and she is seen.  No active bleeding.  She also denies any hemorrhoids or other issues in the perianal region.  Hemoccult card from stool sample on Tuesday, 10/16/2015 was tested and was found positive for blood.  Rectal exam performed today revealed no obvious internal or external hemorrhoids or rectal bleeding.  However, there was trace Hemoccult positive stool on exam.  Once again today.  Blood counts obtained today revealed that hemoglobin is excellent improved from 9.8 up to 10.4.  Also, platelet count is normal at 230.  Since patient's INR continues elevated at 5.64 today; patient was advised to continue holding the Coumadin altogether; and to monitor for any worsening issues with rectal bleeding/GI bleed whatsoever.  Also, reviewed all findings with on call physician, Dr. Benay Spice; and he was in agreement with this plan.  Patient was advised to directly to the emergency department for any worsening symptoms whatsoever. Patient has plans to return to the cancer  Center this coming Monday,  10/22/2015 cream.  We will recheck patient's INR and blood counts.  Pancreatic cancer [facility]) Patient continues to take Xeloda oral therapy as directed.  She takes Xeloda 14 days on; and 7 days off.  Patient is plans to return to the [facility] for labs and a symptom management clinic appointment on 10/22/2015.  She is scheduled for labs and a restaging CT on 10/29/2015 and will return on 10/30/2015 for follow-up visit.  Long term current use of anticoagulant therapy Patient has been holding her Coumadin since Monday, 10/15/2015; since her INR was extremely elevated at 8.94.  Today INR was 5.64; and she was once again encouraged to hold all Coumadin until she is rechecked next Monday, 10/22/2015.  See further notes for details of today's visit.  Hypotension Patient was experiencing episodes of hypotension; it has discontinued all blood pressure medications.  Within the past week or so.  Blood pressure was 133/66 today.  And patient states that she has been checking her blood pressure at home and it has been stable.  Will continue to monitor   Patient stated understanding of all instructions; and was in agreement with this plan of care. The patient knows to call the clinic with any problems, questions or concerns.   Total time spent with patient was 25 minutes;  with greater than 75 percent of that time spent in face to face counseling regarding patient's symptoms,  and coordination of care and follow up.  Disclaimer:This dictation was prepared with Dragon/digital dictation along with [employer]. Any transcriptional errors that result from this process are unintentional.  Drue Second, NP 10/19/2015

## 2015-10-19 NOTE — Assessment & Plan Note (Signed)
Patient has been holding her Coumadin since Monday, 10/15/2015; since her INR was extremely elevated at 8.94.  Today INR was 5.64; and she was once again encouraged to hold all Coumadin until she is rechecked next Monday, 10/22/2015.  See further notes for details of today's visit.

## 2015-10-19 NOTE — Assessment & Plan Note (Signed)
Patient continues to take Xeloda oral therapy as directed.  She takes Xeloda 14 days on; and 7 days off.  Patient is plans to return to the [facility] for labs and a symptom management clinic appointment on 10/22/2015.  She is scheduled for labs and a restaging CT on 10/29/2015 and will return on 10/30/2015 for follow-up visit.

## 2015-10-19 NOTE — Assessment & Plan Note (Signed)
Patient was experiencing episodes of hypotension; it has discontinued all blood pressure medications.  Within the past week or so.  Blood pressure was 133/66 today.  And patient states that she has been checking her blood pressure at home and it has been stable.  Will continue to monitor

## 2015-10-19 NOTE — Assessment & Plan Note (Signed)
Patient states that she had been noticing some darker stools earlier this week; but was attributing the darker stools to the fact that she had been eating some beets.  Her INR had been increased to 8.94 on Monday, 10/15/2015; and she was advised to hold off Coumadin until she was rechecked.  She was given a Hemoccult card to take home; and states that she obtained her stool sample on Tuesday, 10/16/2015.  She states that since that time,-her stool has become much more normal in color and she is seen.  No active bleeding.  She also denies any hemorrhoids or other issues in the perianal region.  Hemoccult card from stool sample on Tuesday, 10/16/2015 was tested and was found positive for blood.  Rectal exam performed today revealed no obvious internal or external hemorrhoids or rectal bleeding.  However, there was trace Hemoccult positive stool on exam.  Once again today.  Blood counts obtained today revealed that hemoglobin is excellent improved from 9.8 up to 10.4.  Also, platelet count is normal at 230.  Since patient's INR continues elevated at 5.64 today; patient was advised to continue holding the Coumadin altogether; and to monitor for any worsening issues with rectal bleeding/GI bleed whatsoever.  Also, reviewed all findings with on call physician, Dr. Benay Spice; and he was in agreement with this plan.  Patient was advised to directly to the emergency department for any worsening symptoms whatsoever. Patient has plans to return to the [facility] this coming Monday, 10/22/2015 cream.  We will recheck patient's INR and blood counts.

## 2015-10-22 ENCOUNTER — Other Ambulatory Visit (HOSPITAL_BASED_OUTPATIENT_CLINIC_OR_DEPARTMENT_OTHER): Payer: Medicare Other

## 2015-10-22 ENCOUNTER — Ambulatory Visit (HOSPITAL_BASED_OUTPATIENT_CLINIC_OR_DEPARTMENT_OTHER): Payer: Medicare Other | Admitting: Nurse Practitioner

## 2015-10-22 VITALS — BP 123/68 | HR 88 | Temp 98.5°F | Resp 18 | Ht 66.5 in | Wt 132.1 lb

## 2015-10-22 DIAGNOSIS — C251 Malignant neoplasm of body of pancreas: Secondary | ICD-10-CM

## 2015-10-22 DIAGNOSIS — K625 Hemorrhage of anus and rectum: Secondary | ICD-10-CM

## 2015-10-22 DIAGNOSIS — Z7901 Long term (current) use of anticoagulants: Secondary | ICD-10-CM

## 2015-10-22 DIAGNOSIS — C259 Malignant neoplasm of pancreas, unspecified: Secondary | ICD-10-CM

## 2015-10-22 DIAGNOSIS — C250 Malignant neoplasm of head of pancreas: Secondary | ICD-10-CM

## 2015-10-22 LAB — CBC WITH DIFFERENTIAL/PLATELET
BASO%: 0.8 % (ref 0.0–2.0)
Basophils Absolute: 0 10*3/uL (ref 0.0–0.1)
EOS%: 5.7 % (ref 0.0–7.0)
Eosinophils Absolute: 0.3 10*3/uL (ref 0.0–0.5)
HEMATOCRIT: 28.8 % — AB (ref 34.8–46.6)
HEMOGLOBIN: 9.1 g/dL — AB (ref 11.6–15.9)
LYMPH#: 1.8 10*3/uL (ref 0.9–3.3)
LYMPH%: 40.2 % (ref 14.0–49.7)
MCH: 30 pg (ref 25.1–34.0)
MCHC: 31.6 g/dL (ref 31.5–36.0)
MCV: 95 fL (ref 79.5–101.0)
MONO#: 0.5 10*3/uL (ref 0.1–0.9)
MONO%: 10.8 % (ref 0.0–14.0)
NEUT#: 1.9 10*3/uL (ref 1.5–6.5)
NEUT%: 42.5 % (ref 38.4–76.8)
Platelets: 200 10*3/uL (ref 145–400)
RBC: 3.03 10*6/uL — ABNORMAL LOW (ref 3.70–5.45)
RDW: 24.3 % — ABNORMAL HIGH (ref 11.2–14.5)
WBC: 4.5 10*3/uL (ref 3.9–10.3)

## 2015-10-22 LAB — COMPREHENSIVE METABOLIC PANEL
ALBUMIN: 2.9 g/dL — AB (ref 3.5–5.0)
ALK PHOS: 108 U/L (ref 40–150)
ALT: 11 U/L (ref 0–55)
AST: 15 U/L (ref 5–34)
Anion Gap: 12 mEq/L — ABNORMAL HIGH (ref 3–11)
BUN: 16.6 mg/dL (ref 7.0–26.0)
CALCIUM: 9.1 mg/dL (ref 8.4–10.4)
CO2: 23 mEq/L (ref 22–29)
CREATININE: 0.9 mg/dL (ref 0.6–1.1)
Chloride: 105 mEq/L (ref 98–109)
EGFR: 58 mL/min/{1.73_m2} — ABNORMAL LOW (ref >90)
Glucose: 167 mg/dl — ABNORMAL HIGH (ref 70–140)
Potassium: 3.9 mEq/L (ref 3.5–5.1)
Sodium: 139 mEq/L (ref 136–145)
TOTAL PROTEIN: 6.4 g/dL (ref 6.4–8.3)
Total Bilirubin: 0.39 mg/dL (ref 0.20–1.20)

## 2015-10-22 LAB — PROTIME-INR
INR: 2.9 (ref 2.00–3.50)
PROTIME: 34.8 s — AB (ref 10.6–13.4)

## 2015-10-22 MED ORDER — ONDANSETRON HCL 8 MG PO TABS: 8.0000 mg | ORAL_TABLET | Freq: Two times a day (BID) | ORAL | Status: DC | PRN

## 2015-10-22 MED ORDER — TRAMADOL HCL 50 MG PO TABS: 50.0000 mg | ORAL_TABLET | Freq: Four times a day (QID) | ORAL | Status: DC | PRN

## 2015-10-23 ENCOUNTER — Telehealth: Payer: Self-pay | Admitting: Nurse Practitioner

## 2015-10-23 ENCOUNTER — Other Ambulatory Visit: Payer: Self-pay | Admitting: Nurse Practitioner

## 2015-10-23 ENCOUNTER — Encounter: Payer: Self-pay | Admitting: Nurse Practitioner

## 2015-10-23 DIAGNOSIS — C251 Malignant neoplasm of body of pancreas: Secondary | ICD-10-CM

## 2015-10-23 DIAGNOSIS — K625 Hemorrhage of anus and rectum: Secondary | ICD-10-CM

## 2015-10-23 DIAGNOSIS — Z7901 Long term (current) use of anticoagulants: Secondary | ICD-10-CM

## 2015-10-23 NOTE — Telephone Encounter (Signed)
Called and spoke to Lisa-patient's daughter (at pt's request).   Reviewed the discussion with Dr. Watt Climes and Dr. Benay Spice in detail:  Long discussion with Dr. Watt Climes on-call gastroenterologist for patient's gastroenterologist, Dr. Paulita Fujita.  He recommended that patient continue to hold the Coumadin since there was the possibility.  The patient has a mild GI bleed that may resolve on its own once patient's INR has completely normalized back to baseline.  He suggested consideration of obtaining a Doppler ultrasound of her bilateral legs to ensure that the previous DVT that was diagnosed on 06/09/2015 has resolved.  He also suggested repeat labs later this week to confirm that hemoglobin is remaining stable.  He stated that he would be available for further consultation later this week if needed for any pain whatsoever.  Also, reviewed in detail with Dr. Benay Spice on-call physician here at the cancer center as well.  He suggested that there was no need to consider a repeat Doppler ultrasound at this time; since there was such a high risk of repeat DVT/blood clots secondary to her pancreatic cancer diagnosis.  He advised that patient continue holding the Coumadin this week until patient returns to her baseline INR; and then to consider initiating Lovenox injections on a once daily basis.  Patient will be rescheduled for labs and visit on either Thursday  this week for follow-up.  She knows to call in the interim if any new worries or concerns.

## 2015-10-23 NOTE — Assessment & Plan Note (Signed)
Patient complained early last week of some dark stools; and her stool sample from last Tuesday, 10/16/2015 was Hemoccult positive.  At that time, her INR had been tested at 8.94; and patient was continuing to hold her Coumadin as instructed.  When she was seen at the [facility] this past Thursday, 10/18/2015-she stated that her stools were much less dark.  However, stool sample obtained today was also trace Hemoccult-positive as well.  INR when checked on Thursday, 10/18/2015 had decreased down to 5.64.  Patient continue to hold her Coumadin over the weekend and presented to the [facility] today for recheck.  Patient states that she does continue with some mildly darkened stools; but has noticed no frank blood.  She states she is actually feeling much better than last week.  She denies any nausea, vomiting, diarrhea, or constipation.  She also denies having any internal or external hemorrhoids.  Blood counts obtained today revealed a hemoglobin has dropped from 10.4 down to 9.1.  Abdomen was soft and nontender.  Bowel sounds are positive in all 4 quads.  Vital signs were stable.  Patient was afebrile.  INR on check today has improved and is now therapeutic at 2.9.  Long discussion with Dr. Watt Climes on-call gastroenterologist for patient's gastroenterologist, Dr. Paulita Fujita.  He recommended that patient continue to hold the Coumadin since there was the possibility.  The patient has a mild GI bleed that may resolve on its own once patient's INR has completely normalized back to baseline.  He suggested consideration of obtaining a Doppler ultrasound of her bilateral legs to ensure that the previous DVT that was diagnosed on 06/09/2015 has resolved.  He also suggested repeat labs later this week to confirm that hemoglobin is remaining stable.  He stated that he would be available for further consultation later this week if needed for any pain whatsoever.  Also, reviewed in detail with Dr. Benay Spice on-call  physician here at the cancer center as well.  He suggested that there was no need to consider a repeat Doppler ultrasound at this time; since there was such a high risk of repeat DVT/blood clots secondary to her pancreatic cancer diagnosis.  He advised that patient continue holding the Coumadin this week until patient returns to her baseline INR; and then to consider initiating Lovenox injections on a once daily basis.  Patient will be rescheduled for labs and visit on either Thursday or Friday this week for follow-up.  She knows to call in the interim if any new worries or concerns.

## 2015-10-23 NOTE — Assessment & Plan Note (Addendum)
Patient continues to take Xeloda oral therapy as directed.  She takes Xeloda 14 days on; and 7 days off.  She will return either Thursday or Friday this week for labs and a follow-up visit.  She is scheduled for labs and a restaging CT on 10/29/2015 and will return on 10/30/2015 for follow-up visit.  Note: Patient's blood pressure has essentially stabilized off of all of her blood pressure medication.  She is checking her blood pressure at home on a daily basis.

## 2015-10-23 NOTE — Telephone Encounter (Signed)
spoke w/ pt daughter confirmed 6/22 apt per pof

## 2015-10-23 NOTE — Assessment & Plan Note (Signed)
Patient complained early last week of some dark stools; and her stool sample from last Tuesday, 10/16/2015 was Hemoccult positive.  At that time, her INR had been tested at 8.94; and patient was continuing to hold her Coumadin as instructed.  When she was seen at the [facility] this past Thursday, 10/18/2015-she stated that her stools were much less dark.  However, stool sample obtained today was also trace Hemoccult-positive as well.  INR when checked on Thursday, 10/18/2015 had decreased down to 5.64.  Patient continue to hold her Coumadin over the weekend and presented to the [facility] today for recheck.  Patient states that she does continue with some mildly darkened stools; but has noticed no frank blood.  She states she is actually feeling much better than last week.  She denies any nausea, vomiting, diarrhea, or constipation.  She also denies having any internal or external hemorrhoids.  Blood counts obtained today revealed a hemoglobin has dropped from 10.4 down to 9.1.  Abdomen was soft and nontender.  Bowel sounds are positive in all 4 quads.  Vital signs were stable.  Patient was afebrile.  INR on check today has improved and is now therapeutic at 2.9.  Long discussion with Dr. Watt Climes on-call gastroenterologist for patient's gastroenterologist, Dr. Paulita Fujita.  He recommended that patient continue to hold the Coumadin since there was the possibility.  The patient has a mild GI bleed that may resolve on its own once patient's INR has completely normalized back to baseline.  He suggested consideration of obtaining a Doppler ultrasound of her bilateral legs to ensure that the previous DVT that was diagnosed on 06/09/2015 has resolved.  He also suggested repeat labs later this week to confirm that hemoglobin is remaining stable.  He stated that he would be available for further consultation later this week if needed for any pain whatsoever.  Also, reviewed in detail with Dr. Benay Spice on-call  physician here at the cancer center as well.  He suggested that there was no need to consider a repeat Doppler ultrasound at this time; since there was such a high risk of repeat DVT/blood clots secondary to her pancreatic cancer diagnosis.  He advised that patient continue holding the Coumadin this week until patient returns to her baseline INR; and then to consider initiating Lovenox injections on a once daily basis.  Patient will be rescheduled for labs and visit on either Thursday or Friday this week for follow-up.  She knows to call in the interim if any new worries or concerns.

## 2015-10-23 NOTE — Progress Notes (Signed)
SYMPTOM MANAGEMENT CLINIC    Chief Complaint: Rectal bleeding  HPI:  Nicole Sherman 82 y.o. female diagnosed with pancreatic cancer.  Currently undergoing Xeloda oral therapy.   Patient complained early last week of some dark stools; and her stool sample from last Tuesday, 10/16/2015 was Hemoccult positive.  At that time, her INR had been tested at 8.94; and patient was continuing to hold her Coumadin as instructed.  When she was seen at the [place] this past Thursday, 10/18/2015-she stated that her stools were much less dark.  However, stool sample obtained today was also trace Hemoccult-positive as well.  INR when checked on Thursday, 10/18/2015 had decreased down to 5.64.  Patient continue to hold her Coumadin over the weekend and presented to the [place] today for recheck.  Patient states that she does continue with some mildly darkened stools; but has noticed no frank blood.  She states she is actually feeling much better than last week.  She denies any nausea, vomiting, diarrhea, or constipation.  She also denies having any internal or external hemorrhoids.  Blood counts obtained today revealed a hemoglobin has dropped from 10.4 down to 9.1.  Abdomen was soft and nontender.  Bowel sounds are positive in all 4 quads.  Vital signs were stable.  Patient was afebrile.  INR on check today has improved and is now therapeutic at 2.9.  Long discussion with Dr. Watt Climes on-call gastroenterologist for patient's gastroenterologist, Dr. Paulita Fujita.  He recommended that patient continue to hold the Coumadin since there was the possibility.  The patient has a mild GI bleed that may resolve on its own once patient's INR has completely normalized back to baseline.  He suggested consideration of obtaining a Doppler ultrasound of her bilateral legs to ensure that the previous DVT that was diagnosed on 06/09/2015 has resolved.  He also suggested repeat labs later this week to confirm that hemoglobin is  remaining stable.  He stated that he would be available for further consultation later this week if needed for any pain whatsoever.  Also, reviewed in detail with Dr. Benay Spice on-call physician here at the cancer center as well.  He suggested that there was no need to consider a repeat Doppler ultrasound at this time; since there was such a high risk of repeat DVT/blood clots secondary to her pancreatic cancer diagnosis.  He advised that patient continue holding the Coumadin this week until patient returns to her baseline INR; and then to consider initiating Lovenox injections on a once daily basis.  Patient will be rescheduled for labs and visit on either Thursday or Friday this week for follow-up.  She knows to call in the interim if any new worries or concerns.  Oncology History   Pancreatic cancer [facility])   Staging form: Pancreas, AJCC 7th Edition     Clinical stage from 05/27/2014: Stage IIB (T2, N1, M0) - Signed by [name], MD on 05/31/2015       Pancreatic cancer ([facility])   05/16/2015 Imaging CT chest, abdomen and pelvis with contrast showed a bulky mass at pancreatic neck/body, tumor encases the celiac and proximal branches and a cruise the main portal vein, peripancreatic adenopathy. no distant mets.    05/28/2015 Initial Biopsy Pancreatic mass needle biopsy showed well differentiated adenocarcinoma   05/31/2015 Initial Diagnosis Pancreatic cancer ([place])   05/31/2015 Tumor Marker CA19.9 =375   06/05/2015 - 07/03/2015 Chemotherapy weekly gemcitabine 1077m/m2, dose reduced to 8514mm2 on week 2 due to tolerance issue   08/27/2015 -  Chemotherapy xeloda 105m bid, 14 days on, 7 days off    Review of Systems  Gastrointestinal: Positive for melena.  All other systems reviewed and are negative.   Past Medical History  Diagnosis Date  . Hypertension   . Hypothyroidism   . Diabetes mellitus without complication ([place]   . Arthritis     osteoarthritis-hands wrist  . Diverticulosis     showing  on CT of abdomen 05-16-15  . Cancer ([facility]     pancreatic mass- dx. adenocarcinoma" CT abdomen 05-16-15    Past Surgical History  Procedure Laterality Date  . Joint replacement Right   . Shoulder arthroscopy w/ rotator cuff repair Left   . Shoulder open rotator cuff repair Right   . Tubal ligation    . Appendectomy      open  . Ankle surgery Right   . Eye surgery      macular hole surgery repair  . Cataract extraction Right   . Eus N/A 05/28/2015    Procedure: UPPER ENDOSCOPIC ULTRASOUND (EUS) LINEAR;  Surgeon: WArta Silence MD;  Location: [facility];  Service: Endoscopy;  Laterality: N/A;    has Pancreatic cancer ([place]; Long term current use of anticoagulant therapy; Acute deep vein thrombosis (DVT) of distal vein of left lower extremity ([place]; Hypothyroidism; Hypertension; Diabetes mellitus without complication ([place]; and Rectal bleeding on her problem list.    is allergic to keflex and aspirin.    Medication List       This list is accurate as of: 10/22/15 11:59 PM.  Always use your most recent med list.               capecitabine 500 MG tablet  Commonly known as:  XELODA  2 weeks on and 1 week off     levothyroxine 100 MCG tablet  Commonly known as:  SYNTHROID, LEVOTHROID  Take 100 mcg by mouth daily before breakfast.     metFORMIN 1000 MG tablet  Commonly known as:  GLUCOPHAGE  Take 1,000 mg by mouth 2 (two) times daily with a meal.     omeprazole 20 MG tablet  Commonly known as:  PRILOSEC OTC  Take 20 mg by mouth daily as needed (acid reflux). Reported on 10/18/2015     ondansetron 8 MG tablet  Commonly known as:  ZOFRAN  Take 1 tablet (8 mg total) by mouth 2 (two) times daily as needed (Nausea or vomiting).     traMADol 50 MG tablet  Commonly known as:  ULTRAM  Take 1 tablet (50 mg total) by mouth every 6 (six) hours as needed.     warfarin 1 MG tablet  Commonly known as:  COUMADIN  Take 1 tablet (1 mg total) by mouth daily.     warfarin 2 MG tablet   Commonly known as:  COUMADIN  Take 1 tablet (2 mg total) by mouth daily.         PHYSICAL EXAMINATION  Oncology Vitals 10/22/2015 10/18/2015  Height 169 cm 169 cm  Weight 59.92 kg 59.603 kg  Weight (lbs) 132 lbs 2 oz 131 lbs 6 oz  BMI (kg/m2) 21 kg/m2 20.89 kg/m2  Temp 98.5 98.3  Pulse 88 94  Resp 18 18  SpO2 98 97  BSA (m2) 1.68 m2 1.67 m2   BP Readings from Last 2 Encounters:  10/22/15 123/68  10/18/15 133/66    Physical Exam  Constitutional: She is oriented to person, place, and time and well-developed, well-nourished, and in no distress.  HENT:  Head: Normocephalic and atraumatic.  Eyes: Conjunctivae and EOM are normal. Pupils are equal, round, and reactive to light. Right eye exhibits no discharge. Left eye exhibits no discharge. No scleral icterus.  Neck: Normal range of motion.  Pulmonary/Chest: Effort normal. No respiratory distress.  Musculoskeletal: Normal range of motion. She exhibits no edema or tenderness.  Neurological: She is alert and oriented to person, place, and time. Gait normal.  Skin: Skin is warm and dry.  Psychiatric: Affect normal.  Nursing note and vitals reviewed.   LABORATORY DATA:. Appointment on 10/22/2015  Component Date Value Ref Range Status  . WBC 10/22/2015 4.5  3.9 - 10.3 10e3/uL Final  . NEUT# 10/22/2015 1.9  1.5 - 6.5 10e3/uL Final  . HGB 10/22/2015 9.1* 11.6 - 15.9 g/dL Final  . HCT 10/22/2015 28.8* 34.8 - 46.6 % Final  . Platelets 10/22/2015 200  145 - 400 10e3/uL Final  . MCV 10/22/2015 95.0  79.5 - 101.0 fL Final  . MCH 10/22/2015 30.0  25.1 - 34.0 pg Final  . MCHC 10/22/2015 31.6  31.5 - 36.0 g/dL Final  . RBC 10/22/2015 3.03* 3.70 - 5.45 10e6/uL Final  . RDW 10/22/2015 24.3* 11.2 - 14.5 % Final  . lymph# 10/22/2015 1.8  0.9 - 3.3 10e3/uL Final  . MONO# 10/22/2015 0.5  0.1 - 0.9 10e3/uL Final  . Eosinophils Absolute 10/22/2015 0.3  0.0 - 0.5 10e3/uL Final  . Basophils Absolute 10/22/2015 0.0  0.0 - 0.1 10e3/uL Final  .  NEUT% 10/22/2015 42.5  38.4 - 76.8 % Final  . LYMPH% 10/22/2015 40.2  14.0 - 49.7 % Final  . MONO% 10/22/2015 10.8  0.0 - 14.0 % Final  . EOS% 10/22/2015 5.7  0.0 - 7.0 % Final  . BASO% 10/22/2015 0.8  0.0 - 2.0 % Final  . Sodium 10/22/2015 139  136 - 145 mEq/L Final  . Potassium 10/22/2015 3.9  3.5 - 5.1 mEq/L Final  . Chloride 10/22/2015 105  98 - 109 mEq/L Final  . CO2 10/22/2015 23  22 - 29 mEq/L Final  . Glucose 10/22/2015 167* 70 - 140 mg/dl Final   Glucose reference range is for nonfasting patients. Fasting glucose reference range is 70- 100.  Marland Kitchen BUN 10/22/2015 16.6  7.0 - 26.0 mg/dL Final  . Creatinine 10/22/2015 0.9  0.6 - 1.1 mg/dL Final  . Total Bilirubin 10/22/2015 0.39  0.20 - 1.20 mg/dL Final  . Alkaline Phosphatase 10/22/2015 108  40 - 150 U/L Final  . AST 10/22/2015 15  5 - 34 U/L Final  . ALT 10/22/2015 11  0 - 55 U/L Final  . Total Protein 10/22/2015 6.4  6.4 - 8.3 g/dL Final  . Albumin 10/22/2015 2.9* 3.5 - 5.0 g/dL Final  . Calcium 10/22/2015 9.1  8.4 - 10.4 mg/dL Final  . Anion Gap 10/22/2015 12* 3 - 11 mEq/L Final  . EGFR 10/22/2015 58* >90 ml/min/1.73 m2 Final   eGFR is calculated using the CKD-EPI Creatinine Equation (2009)  . Protime 10/22/2015 34.8* 10.6 - 13.4 Seconds Final  . INR 10/22/2015 2.90  2.00 - 3.50 Final   Comment: INR is useful only to assess adequacy of anticoagulation with coumadin when comparing results from different labs. It should not be used to estimate bleeding risk or presence/abscense of coagulopathy in patients not on coumadin. Expected INR ranges for  nontherapeutic patients is 0.88 - 1.12.   Marland Kitchen Lovenox 10/22/2015 No   Final    RADIOGRAPHIC STUDIES: No results found.  ASSESSMENT/PLAN:    Rectal bleeding Patient complained early last week of some dark stools; and her stool sample from last Tuesday, 10/16/2015 was Hemoccult positive.  At that time, her INR had been tested at 8.94; and patient was continuing to hold her Coumadin as  instructed.  When she was seen at the [place] this past Thursday, 10/18/2015-she stated that her stools were much less dark.  However, stool sample obtained today was also trace Hemoccult-positive as well.  INR when checked on Thursday, 10/18/2015 had decreased down to 5.64.  Patient continue to hold her Coumadin over the weekend and presented to the [place] today for recheck.  Patient states that she does continue with some mildly darkened stools; but has noticed no frank blood.  She states she is actually feeling much better than last week.  She denies any nausea, vomiting, diarrhea, or constipation.  She also denies having any internal or external hemorrhoids.  Blood counts obtained today revealed a hemoglobin has dropped from 10.4 down to 9.1.  Abdomen was soft and nontender.  Bowel sounds are positive in all 4 quads.  Vital signs were stable.  Patient was afebrile.  INR on check today has improved and is now therapeutic at 2.9.  Long discussion with Dr. Watt Climes on-call gastroenterologist for patient's gastroenterologist, Dr. Paulita Fujita.  He recommended that patient continue to hold the Coumadin since there was the possibility.  The patient has a mild GI bleed that may resolve on its own once patient's INR has completely normalized back to baseline.  He suggested consideration of obtaining a Doppler ultrasound of her bilateral legs to ensure that the previous DVT that was diagnosed on 06/09/2015 has resolved.  He also suggested repeat labs later this week to confirm that hemoglobin is remaining stable.  He stated that he would be available for further consultation later this week if needed for any pain whatsoever.  Also, reviewed in detail with Dr. Benay Spice on-call physician here at the cancer center as well.  He suggested that there was no need to consider a repeat Doppler ultrasound at this time; since there was such a high risk of repeat DVT/blood clots secondary to her pancreatic cancer  diagnosis.  He advised that patient continue holding the Coumadin this week until patient returns to her baseline INR; and then to consider initiating Lovenox injections on a once daily basis.  Patient will be rescheduled for labs and visit on either Thursday or Friday this week for follow-up.  She knows to call in the interim if any new worries or concerns.  Pancreatic cancer [facility]) Patient continues to take Xeloda oral therapy as directed.  She takes Xeloda 14 days on; and 7 days off.  She will return either Thursday or Friday this week for labs and a follow-up visit.  She is scheduled for labs and a restaging CT on 10/29/2015 and will return on 10/30/2015 for follow-up visit.  Note: Patient's blood pressure has essentially stabilized off of all of her blood pressure medication.  She is checking her blood pressure at home on a daily basis.    Long term current use of anticoagulant therapy Patient complained early last week of some dark stools; and her stool sample from last Tuesday, 10/16/2015 was Hemoccult positive.  At that time, her INR had been tested at 8.94; and patient was continuing to hold her Coumadin as instructed.  When she was seen at the [facility] this past Thursday, 10/18/2015-she stated that her stools were much less  dark.  However, stool sample obtained today was also trace Hemoccult-positive as well.  INR when checked on Thursday, 10/18/2015 had decreased down to 5.64.  Patient continue to hold her Coumadin over the weekend and presented to the [facility] today for recheck.  Patient states that she does continue with some mildly darkened stools; but has noticed no frank blood.  She states she is actually feeling much better than last week.  She denies any nausea, vomiting, diarrhea, or constipation.  She also denies having any internal or external hemorrhoids.  Blood counts obtained today revealed a hemoglobin has dropped from 10.4 down to 9.1.  Abdomen was soft and  nontender.  Bowel sounds are positive in all 4 quads.  Vital signs were stable.  Patient was afebrile.  INR on check today has improved and is now therapeutic at 2.9.  Long discussion with Dr. Watt Climes on-call gastroenterologist for patient's gastroenterologist, Dr. Paulita Fujita.  He recommended that patient continue to hold the Coumadin since there was the possibility.  The patient has a mild GI bleed that may resolve on its own once patient's INR has completely normalized back to baseline.  He suggested consideration of obtaining a Doppler ultrasound of her bilateral legs to ensure that the previous DVT that was diagnosed on 06/09/2015 has resolved.  He also suggested repeat labs later this week to confirm that hemoglobin is remaining stable.  He stated that he would be available for further consultation later this week if needed for any pain whatsoever.  Also, reviewed in detail with Dr. Benay Spice on-call physician here at the cancer center as well.  He suggested that there was no need to consider a repeat Doppler ultrasound at this time; since there was such a high risk of repeat DVT/blood clots secondary to her pancreatic cancer diagnosis.  He advised that patient continue holding the Coumadin this week until patient returns to her baseline INR; and then to consider initiating Lovenox injections on a once daily basis.  Patient will be rescheduled for labs and visit on either Thursday or Friday this week for follow-up.  She knows to call in the interim if any new worries or concerns.   Patient stated understanding of all instructions; and was in agreement with this plan of care. The patient knows to call the clinic with any problems, questions or concerns.   Total time spent with patient was 40 minutes;  with greater than 75 percent of that time spent in face to face counseling regarding patient's symptoms,  and coordination of care and follow up.  Disclaimer:This dictation was prepared with Dragon/digital  dictation along with [employer]. Any transcriptional errors that result from this process are unintentional.  Drue Second, NP 10/23/2015

## 2015-10-25 ENCOUNTER — Ambulatory Visit (HOSPITAL_BASED_OUTPATIENT_CLINIC_OR_DEPARTMENT_OTHER): Payer: Medicare Other | Admitting: Nurse Practitioner

## 2015-10-25 ENCOUNTER — Other Ambulatory Visit (HOSPITAL_BASED_OUTPATIENT_CLINIC_OR_DEPARTMENT_OTHER): Payer: Medicare Other

## 2015-10-25 VITALS — BP 151/93 | HR 94 | Temp 98.7°F | Resp 18 | Ht 66.5 in | Wt 131.5 lb

## 2015-10-25 DIAGNOSIS — K625 Hemorrhage of anus and rectum: Secondary | ICD-10-CM

## 2015-10-25 DIAGNOSIS — C251 Malignant neoplasm of body of pancreas: Secondary | ICD-10-CM

## 2015-10-25 DIAGNOSIS — Z7901 Long term (current) use of anticoagulants: Secondary | ICD-10-CM | POA: Diagnosis not present

## 2015-10-25 DIAGNOSIS — I824Z2 Acute embolism and thrombosis of unspecified deep veins of left distal lower extremity: Secondary | ICD-10-CM

## 2015-10-25 DIAGNOSIS — C259 Malignant neoplasm of pancreas, unspecified: Secondary | ICD-10-CM

## 2015-10-25 LAB — COMPREHENSIVE METABOLIC PANEL
ALBUMIN: 3 g/dL — AB (ref 3.5–5.0)
ALK PHOS: 146 U/L (ref 40–150)
ALT: 13 U/L (ref 0–55)
AST: 19 U/L (ref 5–34)
Anion Gap: 11 mEq/L (ref 3–11)
BILIRUBIN TOTAL: 0.58 mg/dL (ref 0.20–1.20)
BUN: 14 mg/dL (ref 7.0–26.0)
CALCIUM: 9.1 mg/dL (ref 8.4–10.4)
CO2: 22 mEq/L (ref 22–29)
CREATININE: 0.9 mg/dL (ref 0.6–1.1)
Chloride: 104 mEq/L (ref 98–109)
EGFR: 62 mL/min/{1.73_m2} — ABNORMAL LOW (ref >90)
Glucose: 163 mg/dl — ABNORMAL HIGH (ref 70–140)
POTASSIUM: 3.8 meq/L (ref 3.5–5.1)
Sodium: 138 mEq/L (ref 136–145)
TOTAL PROTEIN: 6.7 g/dL (ref 6.4–8.3)

## 2015-10-25 LAB — CBC WITH DIFFERENTIAL/PLATELET
BASO%: 1.1 % (ref 0.0–2.0)
BASOS ABS: 0 10*3/uL (ref 0.0–0.1)
EOS ABS: 0.2 10*3/uL (ref 0.0–0.5)
EOS%: 4.8 % (ref 0.0–7.0)
HEMATOCRIT: 30.2 % — AB (ref 34.8–46.6)
HEMOGLOBIN: 9.8 g/dL — AB (ref 11.6–15.9)
LYMPH#: 1.4 10*3/uL (ref 0.9–3.3)
LYMPH%: 33.3 % (ref 14.0–49.7)
MCH: 30.8 pg (ref 25.1–34.0)
MCHC: 32.3 g/dL (ref 31.5–36.0)
MCV: 95.3 fL (ref 79.5–101.0)
MONO#: 0.5 10*3/uL (ref 0.1–0.9)
MONO%: 11.1 % (ref 0.0–14.0)
NEUT%: 49.7 % (ref 38.4–76.8)
NEUTROS ABS: 2.1 10*3/uL (ref 1.5–6.5)
Platelets: 227 10*3/uL (ref 145–400)
RBC: 3.17 10*6/uL — ABNORMAL LOW (ref 3.70–5.45)
RDW: 24.9 % — AB (ref 11.2–14.5)
WBC: 4.2 10*3/uL (ref 3.9–10.3)

## 2015-10-25 LAB — PROTIME-INR
INR: 1.3 — AB (ref 2.00–3.50)
Protime: 15.6 Seconds — ABNORMAL HIGH (ref 10.6–13.4)

## 2015-10-25 MED ORDER — RIVAROXABAN 20 MG PO TABS: 20.0000 mg | ORAL_TABLET | Freq: Every day | ORAL | Status: DC

## 2015-10-26 ENCOUNTER — Encounter: Payer: Self-pay | Admitting: Nurse Practitioner

## 2015-10-26 ENCOUNTER — Telehealth: Payer: Self-pay | Admitting: Pharmacist

## 2015-10-26 NOTE — Assessment & Plan Note (Signed)
Patient has history of DVT in the past; has been on Coumadin until recently.  Patient has been holding her Coumadin since last week; secondary to a very high INR.  She has also had some dark stools and her stools were tested Hemoccult positive.  She states that her stools have now normalized in color and she has not seen any active bleeding issues.  She has continued to hold the Coumadin as directed.  INR today has improved and is now 1.3.  Hemoccult stool was negative today.  Patient states that she has no other GI symptoms whatsoever.  She denies any recent fevers or chills.  Full discussion with Dr. Benay Spice on call physician; and he suggested that patient discontinue all Coumadin since it has been very difficult to maintain a therapeutic range-most likely secondary to patient's Xeloda.  He suggested that patient proceed with Xarelto instead.  Since patient is a 32 been on anticoagulation recently; he suggested that patient forego of the Xarelto starter pack; and to instead take the regular Xarelto at 20 mg per day.  The other option offered to the patient was Lovenox injections.  Patient much preferred trying to Xarelto versus the Lovenox-sent.  She does not like the idea of having an injection every day.  Patient has plans to return on Monday, 10/29/2015 for labs and a restaging CT.

## 2015-10-26 NOTE — Assessment & Plan Note (Addendum)
Patient has been holding her Coumadin since last week; secondary to a very high INR.  She has also had some dark stools and her stools were tested Hemoccult positive.  She states that her stools have now normalized in color and she has not seen any active bleeding issues.  She has continued to hold the Coumadin as directed.  INR today has improved and is now 1.3.  Hemoccult stool was negative today.  Patient states that she has no other GI symptoms whatsoever.  She denies any recent fevers or chills.  Full discussion with Dr. Benay Spice on call physician; and he suggested that patient discontinue all Coumadin since it has been very difficult to maintain a therapeutic range-most likely secondary to patient's Xeloda.  He suggested that patient proceed with Xarelto instead.  Since patient is a 32 been on anticoagulation recently; he suggested that patient forego of the Xarelto starter pack; and to instead take the regular Xarelto at 20 mg per day.  The other option offered to the patient was Lovenox injections.  Patient much preferred trying to Xarelto versus the Lovenox-sent.  She does not like the idea of having an injection every day.  Patient has plans to return on Monday, 10/29/2015 for labs and a restaging CT.

## 2015-10-26 NOTE — Progress Notes (Signed)
SYMPTOM MANAGEMENT CLINIC    Chief Complaint: Rectal bleeding  HPI:  Nicole Sherman 82 y.o. female diagnosed with pancreatic cancer.  Currently undergoing Xeloda oral therapy.   Patient has been holding her Coumadin since last week; secondary to a very high INR.  She has also had some dark stools and her stools were tested Hemoccult positive.  She states that her stools have now normalized in color and she has not seen any active bleeding issues.  She has continued to hold the Coumadin as directed.  INR today has improved and is now 1.3.  Hemoccult stool was negative today.  Patient states that she has no other GI symptoms whatsoever.  She denies any recent fevers or chills.  Full discussion with Dr. Benay Spice on call physician; and he suggested that patient discontinue all Coumadin since it has been very difficult to maintain a therapeutic range-most likely secondary to patient's Xeloda.  He suggested that patient proceed with Xarelto instead.  Since patient is a 32 been on anticoagulation recently; he suggested that patient forego of the Xarelto starter pack; and to instead take the regular Xarelto at 20 mg per day.  The other option offered to the patient was Lovenox injections.  Patient much preferred trying to Xarelto versus the Lovenox-sent.  She does not like the idea of having an injection every day.  Patient has plans to return on Monday, 10/29/2015 for labs and a restaging CT.  Oncology History   Pancreatic cancer [facility])   Staging form: Pancreas, AJCC 7th Edition     Clinical stage from 05/27/2014: Stage IIB (T2, N1, M0) - Signed by [name], MD on 05/31/2015       Pancreatic cancer ([place])   05/16/2015 Imaging CT chest, abdomen and pelvis with contrast showed a bulky mass at pancreatic neck/body, tumor encases the celiac and proximal branches and a cruise the main portal vein, peripancreatic adenopathy. no distant mets.    05/28/2015 Initial Biopsy Pancreatic mass needle biopsy  showed well differentiated adenocarcinoma   05/31/2015 Initial Diagnosis Pancreatic cancer ([place])   05/31/2015 Tumor Marker CA19.9 =375   06/05/2015 - 07/03/2015 Chemotherapy weekly gemcitabine 1082m/m2, dose reduced to 8563mm2 on week 2 due to tolerance issue   08/27/2015 -  Chemotherapy xeloda 100021mid, 14 days on, 7 days off    Review of Systems  All other systems reviewed and are negative.   Past Medical History  Diagnosis Date  . Hypertension   . Hypothyroidism   . Diabetes mellitus without complication ([facility] . Arthritis     osteoarthritis-hands wrist  . Diverticulosis     showing on CT of abdomen 05-16-15  . [facility]   pancreatic mass- dx. adenocarcinoma" CT abdomen 05-16-15    Past Surgical History  Procedure Laterality Date  . Joint replacement Right   . Shoulder arthroscopy w/ rotator cuff repair Left   . Shoulder open rotator cuff repair Right   . Tubal ligation    . Appendectomy      open  . Ankle surgery Right   . Eye surgery      macular hole surgery repair  . Cataract extraction Right   . Eus N/A 05/28/2015    Procedure: UPPER ENDOSCOPIC ULTRASOUND (EUS) LINEAR;  Surgeon: WilArta SilenceD;  Location: [facility] ENDOSCOPY;  Service: Endoscopy;  Laterality: N/A;    has Pancreatic cancer ([facility] current use of anticoagulant therapy; Acute deep vein thrombosis (DVT) of distal vein of left  lower extremity ([place]); Hypothyroidism; Hypertension; Diabetes mellitus without complication ([place]); and Rectal bleeding on her problem list.    is allergic to keflex and aspirin.    Medication List       This list is accurate as of: 10/25/15 11:59 PM.  Always use your most recent med list.               capecitabine 500 MG tablet  Commonly known as:  XELODA  2 weeks on and 1 week off     levothyroxine 100 MCG tablet  Commonly known as:  SYNTHROID, LEVOTHROID  Take 100 mcg by mouth daily before breakfast.     metFORMIN 1000 MG tablet  Commonly known as:   GLUCOPHAGE  Take 1,000 mg by mouth 2 (two) times daily with a meal.     omeprazole 20 MG tablet  Commonly known as:  PRILOSEC OTC  Take 20 mg by mouth daily as needed (acid reflux). Reported on 10/18/2015     ondansetron 8 MG tablet  Commonly known as:  ZOFRAN  Take 1 tablet (8 mg total) by mouth 2 (two) times daily as needed (Nausea or vomiting).     rivaroxaban 20 MG Tabs tablet  Commonly known as:  XARELTO  Take 1 tablet (20 mg total) by mouth daily with supper.     traMADol 50 MG tablet  Commonly known as:  ULTRAM  Take 1 tablet (50 mg total) by mouth every 6 (six) hours as needed.         PHYSICAL EXAMINATION  Oncology Vitals 10/25/2015 10/22/2015  Height 169 cm 169 cm  Weight 59.648 kg 59.92 kg  Weight (lbs) 131 lbs 8 oz 132 lbs 2 oz  BMI (kg/m2) 20.91 kg/m2 21 kg/m2  Temp 98.7 98.5  Pulse 94 88  Resp 18 18  SpO2 96 98  BSA (m2) 1.67 m2 1.68 m2   BP Readings from Last 2 Encounters:  10/25/15 151/93  10/22/15 123/68    Physical Exam  Constitutional: She is oriented to person, place, and time and well-developed, well-nourished, and in no distress.  HENT:  Head: Normocephalic and atraumatic.  Eyes: Conjunctivae and EOM are normal. Pupils are equal, round, and reactive to light. Right eye exhibits no discharge. Left eye exhibits no discharge. No scleral icterus.  Neck: Normal range of motion.  Pulmonary/Chest: Effort normal. No respiratory distress.  Genitourinary: Guaiac negative stool.  Musculoskeletal: Normal range of motion. She exhibits no edema or tenderness.  Neurological: She is alert and oriented to person, place, and time. Gait normal.  Skin: Skin is warm and dry.  Psychiatric: Affect normal.  Nursing note and vitals reviewed.   LABORATORY DATA:. Appointment on 10/25/2015  Component Date Value Ref Range Status  . WBC 10/25/2015 4.2  3.9 - 10.3 10e3/uL Final  . NEUT# 10/25/2015 2.1  1.5 - 6.5 10e3/uL Final  . HGB 10/25/2015 9.8* 11.6 - 15.9 g/dL  Final  . HCT 10/25/2015 30.2* 34.8 - 46.6 % Final  . Platelets 10/25/2015 227  145 - 400 10e3/uL Final  . MCV 10/25/2015 95.3  79.5 - 101.0 fL Final  . MCH 10/25/2015 30.8  25.1 - 34.0 pg Final  . MCHC 10/25/2015 32.3  31.5 - 36.0 g/dL Final  . RBC 10/25/2015 3.17* 3.70 - 5.45 10e6/uL Final  . RDW 10/25/2015 24.9* 11.2 - 14.5 % Final  . lymph# 10/25/2015 1.4  0.9 - 3.3 10e3/uL Final  . MONO# 10/25/2015 0.5  0.1 - 0.9 10e3/uL Final  .  Eosinophils Absolute 10/25/2015 0.2  0.0 - 0.5 10e3/uL Final  . Basophils Absolute 10/25/2015 0.0  0.0 - 0.1 10e3/uL Final  . NEUT% 10/25/2015 49.7  38.4 - 76.8 % Final  . LYMPH% 10/25/2015 33.3  14.0 - 49.7 % Final  . MONO% 10/25/2015 11.1  0.0 - 14.0 % Final  . EOS% 10/25/2015 4.8  0.0 - 7.0 % Final  . BASO% 10/25/2015 1.1  0.0 - 2.0 % Final  . Sodium 10/25/2015 138  136 - 145 mEq/L Final  . Potassium 10/25/2015 3.8  3.5 - 5.1 mEq/L Final  . Chloride 10/25/2015 104  98 - 109 mEq/L Final  . CO2 10/25/2015 22  22 - 29 mEq/L Final  . Glucose 10/25/2015 163* 70 - 140 mg/dl Final   Glucose reference range is for nonfasting patients. Fasting glucose reference range is 70- 100.  Marland Kitchen BUN 10/25/2015 14.0  7.0 - 26.0 mg/dL Final  . Creatinine 10/25/2015 0.9  0.6 - 1.1 mg/dL Final  . Total Bilirubin 10/25/2015 0.58  0.20 - 1.20 mg/dL Final  . Alkaline Phosphatase 10/25/2015 146  40 - 150 U/L Final  . AST 10/25/2015 19  5 - 34 U/L Final  . ALT 10/25/2015 13  0 - 55 U/L Final  . Total Protein 10/25/2015 6.7  6.4 - 8.3 g/dL Final  . Albumin 10/25/2015 3.0* 3.5 - 5.0 g/dL Final  . Calcium 10/25/2015 9.1  8.4 - 10.4 mg/dL Final  . Anion Gap 10/25/2015 11  3 - 11 mEq/L Final  . EGFR 10/25/2015 62* >90 ml/min/1.73 m2 Final   eGFR is calculated using the CKD-EPI Creatinine Equation (2009)  . Protime 10/25/2015 15.6* 10.6 - 13.4 Seconds Final  . INR 10/25/2015 1.30* 2.00 - 3.50 Final   Comment: INR is useful only to assess adequacy of anticoagulation with coumadin when  comparing results from different labs. It should not be used to estimate bleeding risk or presence/abscense of coagulopathy in patients not on coumadin. Expected INR ranges for  nontherapeutic patients is 0.88 - 1.12.   Marland Kitchen Lovenox 10/25/2015 No   Final    RADIOGRAPHIC STUDIES: No results found.  ASSESSMENT/PLAN:    Rectal bleeding Patient has been holding her Coumadin since last week; secondary to a very high INR.  She has also had some dark stools and her stools were tested Hemoccult positive.  She states that her stools have now normalized in color and she has not seen any active bleeding issues.  She has continued to hold the Coumadin as directed.  INR today has improved and is now 1.3.  Hemoccult stool was negative today.  Patient states that she has no other GI symptoms whatsoever.  She denies any recent fevers or chills.  Full discussion with Dr. Benay Spice on call physician; and he suggested that patient discontinue all Coumadin since it has been very difficult to maintain a therapeutic range-most likely secondary to patient's Xeloda.  He suggested that patient proceed with Xarelto instead.  Since patient is a 32 been on anticoagulation recently; he suggested that patient forego of the Xarelto starter pack; and to instead take the regular Xarelto at 20 mg per day.  The other option offered to the patient was Lovenox injections.  Patient much preferred trying to Xarelto versus the Lovenox-sent.  She does not like the idea of having an injection every day.  Patient has plans to return on Monday, 10/29/2015 for labs and a restaging CT.   Pancreatic cancer [facility]) Patient continues to take Xeloda  oral therapy as directed.  She takes Xeloda 14 days on; and 7 days off.  She is scheduled for labs and a restaging CT on 10/29/2015 and will return on 10/30/2015 for follow-up visit.        Long term current use of anticoagulant therapy Patient has history of DVT in the past; has been on  Coumadin until recently.  Patient has been holding her Coumadin since last week; secondary to a very high INR.  She has also had some dark stools and her stools were tested Hemoccult positive.  She states that her stools have now normalized in color and she has not seen any active bleeding issues.  She has continued to hold the Coumadin as directed.  INR today has improved and is now 1.3.  Hemoccult stool was negative today.  Patient states that she has no other GI symptoms whatsoever.  She denies any recent fevers or chills.  Full discussion with Dr. Benay Spice on call physician; and he suggested that patient discontinue all Coumadin since it has been very difficult to maintain a therapeutic range-most likely secondary to patient's Xeloda.  He suggested that patient proceed with Xarelto instead.  Since patient is a 32 been on anticoagulation recently; he suggested that patient forego of the Xarelto starter pack; and to instead take the regular Xarelto at 20 mg per day.  The other option offered to the patient was Lovenox injections.  Patient much preferred trying to Xarelto versus the Lovenox-sent.  She does not like the idea of having an injection every day.  Patient has plans to return on Monday, 10/29/2015 for labs and a restaging CT.   Patient stated understanding of all instructions; and was in agreement with this plan of care. The patient knows to call the clinic with any problems, questions or concerns.   Total time spent with patient was 25 minutes;  with greater than 75 percent of that time spent in face to face counseling regarding patient's symptoms,  and coordination of care and follow up.  Disclaimer:This dictation was prepared with Dragon/digital dictation along with [employer]. Any transcriptional errors that result from this process are unintentional.  Drue Second, NP 10/26/2015

## 2015-10-26 NOTE — Telephone Encounter (Signed)
Attempted to reach patient for follow up on oral medication: Xeloda. Spoke with husband who stated the patient was resting. Left Oral Chemotherapy Clinic number with the husband and told him the patient could call back at a later time to talk about her oral chemo medications if she would like. He reported the patient had an appointment at the cancer center next week.   Thank you,  Nuala Alpha, PharmD Oral Chemotherapy Clinic [phone]

## 2015-10-26 NOTE — Assessment & Plan Note (Signed)
Patient continues to take Xeloda oral therapy as directed.  She takes Xeloda 14 days on; and 7 days off.  She is scheduled for labs and a restaging CT on 10/29/2015 and will return on 10/30/2015 for follow-up visit.

## 2015-10-29 ENCOUNTER — Encounter (HOSPITAL_COMMUNITY): Payer: Self-pay

## 2015-10-29 ENCOUNTER — Other Ambulatory Visit: Payer: Medicare Other

## 2015-10-29 ENCOUNTER — Other Ambulatory Visit: Payer: Self-pay | Admitting: *Deleted

## 2015-10-29 ENCOUNTER — Other Ambulatory Visit (HOSPITAL_BASED_OUTPATIENT_CLINIC_OR_DEPARTMENT_OTHER): Payer: Medicare Other

## 2015-10-29 ENCOUNTER — Telehealth: Payer: Self-pay | Admitting: *Deleted

## 2015-10-29 ENCOUNTER — Encounter: Payer: Self-pay | Admitting: Hematology

## 2015-10-29 ENCOUNTER — Ambulatory Visit (HOSPITAL_COMMUNITY)
Admission: RE | Admit: 2015-10-29 | Discharge: 2015-10-29 | Disposition: A | Discharge status: Home / Self Care | Payer: Medicare Other | Source: Ambulatory Visit | Attending: Nurse Practitioner | Admitting: Nurse Practitioner

## 2015-10-29 DIAGNOSIS — I700 Atherosclerosis of aorta: Secondary | ICD-10-CM | POA: Diagnosis not present

## 2015-10-29 DIAGNOSIS — W19XXXD Unspecified fall, subsequent encounter: Secondary | ICD-10-CM | POA: Insufficient documentation

## 2015-10-29 DIAGNOSIS — C251 Malignant neoplasm of body of pancreas: Secondary | ICD-10-CM

## 2015-10-29 DIAGNOSIS — K838 Other specified diseases of biliary tract: Secondary | ICD-10-CM | POA: Insufficient documentation

## 2015-10-29 DIAGNOSIS — I959 Hypotension, unspecified: Secondary | ICD-10-CM | POA: Insufficient documentation

## 2015-10-29 DIAGNOSIS — Z7901 Long term (current) use of anticoagulants: Secondary | ICD-10-CM

## 2015-10-29 DIAGNOSIS — Y92009 Unspecified place in unspecified non-institutional (private) residence as the place of occurrence of the external cause: Secondary | ICD-10-CM

## 2015-10-29 LAB — COMPREHENSIVE METABOLIC PANEL
ALT: 9 U/L (ref 0–55)
AST: 11 U/L (ref 5–34)
Albumin: 3 g/dL — ABNORMAL LOW (ref 3.5–5.0)
Alkaline Phosphatase: 101 U/L (ref 40–150)
Anion Gap: 9 mEq/L (ref 3–11)
BILIRUBIN TOTAL: 0.4 mg/dL (ref 0.20–1.20)
BUN: 15.5 mg/dL (ref 7.0–26.0)
CALCIUM: 9 mg/dL (ref 8.4–10.4)
CHLORIDE: 105 meq/L (ref 98–109)
CO2: 24 mEq/L (ref 22–29)
Creatinine: 0.9 mg/dL (ref 0.6–1.1)
EGFR: 62 mL/min/{1.73_m2} — ABNORMAL LOW (ref >90)
Glucose: 257 mg/dl — ABNORMAL HIGH (ref 70–140)
POTASSIUM: 3.7 meq/L (ref 3.5–5.1)
SODIUM: 137 meq/L (ref 136–145)
Total Protein: 6.3 g/dL — ABNORMAL LOW (ref 6.4–8.3)

## 2015-10-29 LAB — CBC WITH DIFFERENTIAL/PLATELET
BASO%: 0.6 % (ref 0.0–2.0)
BASOS ABS: 0 10*3/uL (ref 0.0–0.1)
EOS%: 2.7 % (ref 0.0–7.0)
Eosinophils Absolute: 0.1 10*3/uL (ref 0.0–0.5)
HEMATOCRIT: 28.9 % — AB (ref 34.8–46.6)
HGB: 9.2 g/dL — ABNORMAL LOW (ref 11.6–15.9)
LYMPH%: 36 % (ref 14.0–49.7)
MCH: 30.9 pg (ref 25.1–34.0)
MCHC: 32 g/dL (ref 31.5–36.0)
MCV: 96.7 fL (ref 79.5–101.0)
MONO#: 0.4 10*3/uL (ref 0.1–0.9)
MONO%: 10.3 % (ref 0.0–14.0)
NEUT#: 2.1 10*3/uL (ref 1.5–6.5)
NEUT%: 50.4 % (ref 38.4–76.8)
Platelets: 224 10*3/uL (ref 145–400)
RBC: 2.99 10*6/uL — ABNORMAL LOW (ref 3.70–5.45)
RDW: 26.5 % — ABNORMAL HIGH (ref 11.2–14.5)
WBC: 4.2 10*3/uL (ref 3.9–10.3)
lymph#: 1.5 10*3/uL (ref 0.9–3.3)

## 2015-10-29 LAB — PROTIME-INR
INR: 1.2 — ABNORMAL LOW (ref 2.00–3.50)
Protime: 14.4 Seconds — ABNORMAL HIGH (ref 10.6–13.4)

## 2015-10-29 MED ORDER — IOPAMIDOL (ISOVUE-300) INJECTION 61%
100.0000 mL | Freq: Once | INTRAVENOUS | Status: AC | PRN
  Administered 2015-10-29: 100 mL via INTRAVENOUS

## 2015-10-29 MED ORDER — DIATRIZOATE MEGLUMINE & SODIUM 66-10 % PO SOLN
30.0000 mL | Freq: Once | ORAL | Status: AC
  Administered 2015-10-29: 30 mL via ORAL

## 2015-10-29 NOTE — Progress Notes (Signed)
per Rosalio Macadamia approved 10/29/15-05/04/16 ref# [id number]

## 2015-10-29 NOTE — Telephone Encounter (Signed)
Received call from daughter Lurline Del re:  Pharmacy could not fill Xarelto for pt due to pt needing prior authorization from insurance.  Spoke with [place] on [address] and requested PA form to be faxed to office.  Gave PA request form to Raquel, care management for assistance. Lisa's   Phone      [phone].

## 2015-10-30 ENCOUNTER — Telehealth: Payer: Self-pay | Admitting: Hematology

## 2015-10-30 ENCOUNTER — Encounter: Payer: Self-pay | Admitting: Hematology

## 2015-10-30 ENCOUNTER — Ambulatory Visit (HOSPITAL_BASED_OUTPATIENT_CLINIC_OR_DEPARTMENT_OTHER): Payer: Medicare Other | Admitting: Hematology

## 2015-10-30 VITALS — BP 151/82 | HR 94 | Temp 98.2°F | Resp 18 | Ht 66.5 in | Wt 131.3 lb

## 2015-10-30 DIAGNOSIS — E119 Type 2 diabetes mellitus without complications: Secondary | ICD-10-CM

## 2015-10-30 DIAGNOSIS — C251 Malignant neoplasm of body of pancreas: Secondary | ICD-10-CM

## 2015-10-30 DIAGNOSIS — E039 Hypothyroidism, unspecified: Secondary | ICD-10-CM | POA: Diagnosis not present

## 2015-10-30 DIAGNOSIS — I824Z2 Acute embolism and thrombosis of unspecified deep veins of left distal lower extremity: Secondary | ICD-10-CM

## 2015-10-30 DIAGNOSIS — I10 Essential (primary) hypertension: Secondary | ICD-10-CM | POA: Diagnosis not present

## 2015-10-30 MED ORDER — HYDROCODONE-ACETAMINOPHEN 5-325 MG PO TABS: 1.0000 | ORAL_TABLET | Freq: Four times a day (QID) | ORAL | Status: DC | PRN

## 2015-10-30 NOTE — Telephone Encounter (Signed)
Gave pt cal & avs °

## 2015-10-30 NOTE — Progress Notes (Signed)
[place]  [phone] [phone]  Clinic Follow Up Note   Patient Care Team: Hulan Fess, MD as PCP - General (Family Medicine) [name], MD as Consulting Physician (Hematology) Arta Silence, MD as Consulting Physician (Gastroenterology) 10/30/2015  CHIEF COMPLAINTS:  Follow up unresectable pancreatic adenocarcinoma    OTHER RELATED ISSUES 1. Left LE DVT diagnosed on 06/09/2015, started on lovenox and bridged to coumadin, which was held on 10/22/2068 due to GI bleeding. Changed to Xarelto on 10/31/2015.   Oncology History   Pancreatic cancer [facility])   Staging form: Pancreas, AJCC 7th Edition     Clinical stage from 05/27/2014: Stage IIB (T2, N1, M0) - Signed by [name], MD on 05/31/2015       Pancreatic cancer ([place])   05/16/2015 Imaging CT chest, abdomen and pelvis with contrast showed a bulky mass at pancreatic neck/body, tumor encases the celiac and proximal branches and a cruise the main portal vein, peripancreatic adenopathy. no distant mets.    05/28/2015 Initial Biopsy Pancreatic mass needle biopsy showed well differentiated adenocarcinoma   05/31/2015 Initial Diagnosis Pancreatic cancer ([place])   05/31/2015 Tumor Marker CA19.9 =375   06/05/2015 - 07/03/2015 Chemotherapy weekly gemcitabine 1000mg /m2, dose reduced to 850mg /m2 on week 2 due to tolerance issue   08/27/2015 -  Chemotherapy xeloda 1000mg  bid, 14 days on, 7 days off    HISTORY OF PRESENTING ILLNESS:  Nicole Sherman 82 y.o. female is here because of her recent abnormal CT scan, which showed a pink vaginal mass, highly suspicious for pancreatic cancer.  She has been haivng epigastric pain after meal for 4-5 months. She also reports left side pain at night when she sleeps on left side, mild back pain, the pain is worse lately, lasts longer especially afte rdinner, it about 5/10, appetite is lower than before, she lost aobut 10-20lbs in the past 5 months. No other complains, she had loose  BM 1-2 time BM for 8-10 months, no hematochezia or melena. She was evaluated by her primary care physician Dr. Rex Kras, abdominal ultrasound was negative on 05/04/2015. She underwent a CT abdomen and pelvis with contrast on 05/16/2015, which showed a 4 x 1 cm mass in pancreatic body and a neck, tumor encases the distal celiac and proximal branches and occludes the main portal vein. No distant metastasis on the CT scan. She was referred to [place] for further workup.  She has good energy level, she is able to do all house work. She has intermittent dizziness from blood pressure meds. No other complains.   CURRENT THERAPY: Xeloda 1000mg  bid, 14 days on, 7 days off, started on 08/27/2015, stopped on 10/30/2015 due to disease progression.  INTERIM HISTORY: [place] returns for follow-up. She is accompanied her husband and two daughters. Developed GI bleeding about 2 weeks ago when his INR was high, Coumadin was subsequently held. She was seen by our nurse practitioner Jenny Reichmann. It resolved spontaneously after a few days, did not require blood transfusion. Her abdominal pain has slightly gotten worse lately, she takes tramadol 2-3 tablets a day, no significant nausea or diarrhea. She completed the third cycle of Xeloda last week, tolerated well overall. No other new complaints.   MEDICAL HISTORY:  Past Medical History  Diagnosis Date  . Hypertension   . Hypothyroidism   . Diabetes mellitus without complication ([place])   . Arthritis     osteoarthritis-hands wrist  . Diverticulosis     showing on CT of abdomen 05-16-15  . Cancer ([place])  pancreatic mass- dx. adenocarcinoma" CT abdomen 05-16-15    SURGICAL HISTORY: Past Surgical History  Procedure Laterality Date  . Joint replacement Right   . Shoulder arthroscopy w/ rotator cuff repair Left   . Shoulder open rotator cuff repair Right   . Tubal ligation    . Appendectomy      open  . Ankle surgery Right   . Eye surgery      macular hole surgery repair  .  Cataract extraction Right   . Eus N/A 05/28/2015    Procedure: UPPER ENDOSCOPIC ULTRASOUND (EUS) LINEAR;  Surgeon: Arta Silence, MD;  Location: [facility] ENDOSCOPY;  Service: Endoscopy;  Laterality: N/A;    SOCIAL HISTORY: Social History   Social History  . Marital Status: Married    Spouse Name: N/A  . Number of Children: 2 daughters   . Years of Education: N/A   Occupational History  . Not on file.   Social History Main Topics  . Smoking status: Former Smoker -- 1.00 packs/day for 30 years    Types: Cigarettes    Quit date: 05/21/1986  . Smokeless tobacco: Not on file  . Alcohol Use: No  . Drug Use: No  . Sexual Activity: Not on file   Other Topics Concern  . Not on file   Social History Narrative    FAMILY HISTORY: Family History  Problem Relation Age of Onset  . Stroke Mother   . Cancer Sister     lung cancer     ALLERGIES:  is allergic to keflex and aspirin.  MEDICATIONS:  Current Outpatient Prescriptions  Medication Sig Dispense Refill  . capecitabine (XELODA) 500 MG tablet 2 weeks on and 1 week off 70 tablet 0  . levothyroxine (SYNTHROID, LEVOTHROID) 100 MCG tablet Take 100 mcg by mouth daily before breakfast.     . metFORMIN (GLUCOPHAGE) 1000 MG tablet Take 1,000 mg by mouth 2 (two) times daily with a meal.     . omeprazole (PRILOSEC OTC) 20 MG tablet Take 20 mg by mouth daily as needed (acid reflux). Reported on 10/18/2015    . ondansetron (ZOFRAN) 8 MG tablet Take 1 tablet (8 mg total) by mouth 2 (two) times daily as needed (Nausea or vomiting). 30 tablet 1  . rivaroxaban (XARELTO) 20 MG TABS tablet Take 1 tablet (20 mg total) by mouth daily with supper. 30 tablet 0  . traMADol (ULTRAM) 50 MG tablet Take 1 tablet (50 mg total) by mouth every 6 (six) hours as needed. 60 tablet 0   No current facility-administered medications for this visit.    REVIEW OF SYSTEMS:   Constitutional: Denies fevers, chills or abnormal night sweats Eyes: Denies blurriness of  vision, double vision or watery eyes Ears, nose, mouth, throat, and face: Denies mucositis or sore throat Respiratory: Denies cough, dyspnea or wheezes Cardiovascular: Denies palpitation, chest discomfort or lower extremity swelling Gastrointestinal:  Denies nausea, heartburn or change in bowel habits Skin: Denies abnormal skin rashes Lymphatics: Denies new lymphadenopathy or easy bruising Neurological:Denies numbness, tingling or new weaknesses Behavioral/Psych: Mood is stable, no new changes  All other systems were reviewed with the patient and are negative.  PHYSICAL EXAMINATION: ECOG PERFORMANCE STATUS: 1-2  Filed Vitals:   10/30/15 1549  BP: 151/82  Pulse: 94  Temp: 98.2 F (36.8 C)  Resp: 18   Filed Weights   10/30/15 1549  Weight: 131 lb 4.8 oz (59.557 kg)    GENERAL:alert, no distress and comfortable SKIN: skin color, texture, turgor are  normal, mild scatter skin rashes involving both breasts and upper agdomen EYES: normal, conjunctiva are pink and non-injected, sclera clear OROPHARYNX:no exudate, no erythema and lips, buccal mucosa, and tongue normal  NECK: supple, thyroid normal size, non-tender, without nodularity LYMPH:  no palpable lymphadenopathy in the cervical, axillary or inguinal LUNGS: clear to auscultation and percussion with normal breathing effort HEART: regular rate & rhythm and no murmurs and no lower extremity edema ABDOMEN:abdomen soft, non-tender and normal bowel sounds Musculoskeletal:no cyanosis of digits and no clubbing  PSYCH: alert & oriented x 3 with fluent speech NEURO: no focal motor/sensory deficits  LABORATORY DATA:  I have reviewed the data as listed  CBC Latest Ref Rng 10/29/2015 10/25/2015 10/22/2015  WBC 3.9 - 10.3 10e3/uL 4.2 4.2 4.5  Hemoglobin 11.6 - 15.9 g/dL 9.2(L) 9.8(L) 9.1(L)  Hematocrit 34.8 - 46.6 % 28.9(L) 30.2(L) 28.8(L)  Platelets 145 - 400 10e3/uL 224 227 200   CMP Latest Ref Rng 10/29/2015 10/25/2015 10/22/2015    Glucose 70 - 140 mg/dl 257(H) 163(H) 167(H)  BUN 7.0 - 26.0 mg/dL 15.5 14.0 16.6  Creatinine 0.6 - 1.1 mg/dL 0.9 0.9 0.9  Sodium 136 - 145 mEq/L 137 138 139  Potassium 3.5 - 5.1 mEq/L 3.7 3.8 3.9  CO2 22 - 29 mEq/L 24 22 23   Calcium 8.4 - 10.4 mg/dL 9.0 9.1 9.1  Total Protein 6.4 - 8.3 g/dL 6.3(L) 6.7 6.4  Total Bilirubin 0.20 - 1.20 mg/dL 0.40 0.58 0.39  Alkaline Phos 40 - 150 U/L 101 146 108  AST 5 - 34 U/L 11 19 15   ALT 0 - 55 U/L 9 13 11    PT/INR today: 22.8/1.9   CA19.9 u/ML 05/31/2015: 751 07/03/2015: 227 07/19/2015: 159 08/23/2015: 304 09/03/2015: 309 10/15/2015: 357  PATHOLOGY REPORT  Diagnosis 05/28/2015 FINE NEEDLE ASPIRATION: NEEDLE ASPIRATION, PANCREAS BODY (SPECIMEN 1 OF 1 COLLECTED 05/28/15): WELL DIFFERENTIATED ADENOCARCINOMA. Preliminary Diagnosis Intraoperative Diagnosis: Adequate (JDP)   RADIOGRAPHIC STUDIES: I have personally reviewed the radiological images as listed and agreed with the findings in the report. Ct Abdomen Pelvis W Wo Contrast  10/29/2015  CLINICAL DATA:  Followup pancreas cancer EXAM: CT CHEST WITH CONTRAST CT ABDOMEN AND PELVIS WITH AND WITHOUT CONTRAST TECHNIQUE: Multidetector CT imaging of the chest was performed during intravenous contrast administration. Multidetector CT imaging of the abdomen and pelvis was performed following the standard protocol before and during bolus administration of intravenous contrast. CONTRAST:  139mL ISOVUE-300 IOPAMIDOL (ISOVUE-300) INJECTION 61% COMPARISON:  07/17/2015 FINDINGS: CT CHEST FINDINGS Mediastinum/Lymph Nodes: The heart size appears normal. There is no pericardial effusion identified. Aortic atherosclerosis. Calcification involving the LAD, RCA and left circumflex coronary artery noted. No enlarged axillary or supraclavicular lymph nodes. No mediastinal or hilar adenopathy. Lungs/Pleura: There is no pleural fluid. Moderate changes of centrilobular emphysema identified. No suspicious pulmonary nodule or mass  noted. Musculoskeletal: No chest wall mass or suspicious bone lesions identified. CT ABDOMEN PELVIS FINDINGS Hepatobiliary: No suspicious liver abnormality identified. Cyst is noted within the inferior right lobe of liver measuring 2.2 cm, image 75 of series 5. Moderate intrahepatic bile duct dilatation is identified. The proximal common bile duct is dilated measuring 1.9 cm. Marked narrowing of the distal common bile duct is again and has progressed when compared with 07/17/2015. Pancreas: Tumor involving the pancreatic neck and body measures 5.1 x 5.1 x 4.2 cm. Previously this measured 3.7 x 2.8 by 4.1 cm. Encasement of the celiac artery is again noted and appears similar to previous exam. Progressive involvement of the  superior mesenteric artery is identified. There is narrowing of the portal vein at the level of the portal venous confluence. Chronic obstruction of the splenic vein noted. Spleen: Within normal limits in size and appearance. Adrenals/Urinary Tract: Normal appearance of the adrenal glands. The kidneys are unremarkable. Urinary bladder appears normal. Stomach/Bowel: The stomach is normal. No pathologic dilatation of the large or small bowel loops. Numerous colonic diverticula identified without acute inflammation. Vascular/Lymphatic: Calcified atherosclerotic disease involves the abdominal aorta. No aneurysm. Gastric and splenic varices are identified. There are no enlarged abdominal or pelvic lymph nodes. Reproductive: Uterus and adnexal structures are unremarkable. Other: There is a small volume of pelvic ascites. No peritoneal nodularity identified. Musculoskeletal: No aggressive lytic or sclerotic bone lesions identified. IMPRESSION: 1. Mass involving the pancreatic neck and body has increased in size from the previous exam dated 07/17/2015. There is progressive involvement of the superior mesenteric artery. Additionally, there is progressive mass effect upon the common bile duct with interval  increase in common bile duct and intrahepatic bile duct dilatation. 2. Aortic atherosclerosis. Electronically Signed   By: Kerby Moors M.D.   On: 10/29/2015 15:21   Ct Chest W Contrast  10/29/2015  CLINICAL DATA:  Followup pancreas cancer EXAM: CT CHEST WITH CONTRAST CT ABDOMEN AND PELVIS WITH AND WITHOUT CONTRAST TECHNIQUE: Multidetector CT imaging of the chest was performed during intravenous contrast administration. Multidetector CT imaging of the abdomen and pelvis was performed following the standard protocol before and during bolus administration of intravenous contrast. CONTRAST:  135mL ISOVUE-300 IOPAMIDOL (ISOVUE-300) INJECTION 61% COMPARISON:  07/17/2015 FINDINGS: CT CHEST FINDINGS Mediastinum/Lymph Nodes: The heart size appears normal. There is no pericardial effusion identified. Aortic atherosclerosis. Calcification involving the LAD, RCA and left circumflex coronary artery noted. No enlarged axillary or supraclavicular lymph nodes. No mediastinal or hilar adenopathy. Lungs/Pleura: There is no pleural fluid. Moderate changes of centrilobular emphysema identified. No suspicious pulmonary nodule or mass noted. Musculoskeletal: No chest wall mass or suspicious bone lesions identified. CT ABDOMEN PELVIS FINDINGS Hepatobiliary: No suspicious liver abnormality identified. Cyst is noted within the inferior right lobe of liver measuring 2.2 cm, image 75 of series 5. Moderate intrahepatic bile duct dilatation is identified. The proximal common bile duct is dilated measuring 1.9 cm. Marked narrowing of the distal common bile duct is again and has progressed when compared with 07/17/2015. Pancreas: Tumor involving the pancreatic neck and body measures 5.1 x 5.1 x 4.2 cm. Previously this measured 3.7 x 2.8 by 4.1 cm. Encasement of the celiac artery is again noted and appears similar to previous exam. Progressive involvement of the superior mesenteric artery is identified. There is narrowing of the portal vein  at the level of the portal venous confluence. Chronic obstruction of the splenic vein noted. Spleen: Within normal limits in size and appearance. Adrenals/Urinary Tract: Normal appearance of the adrenal glands. The kidneys are unremarkable. Urinary bladder appears normal. Stomach/Bowel: The stomach is normal. No pathologic dilatation of the large or small bowel loops. Numerous colonic diverticula identified without acute inflammation. Vascular/Lymphatic: Calcified atherosclerotic disease involves the abdominal aorta. No aneurysm. Gastric and splenic varices are identified. There are no enlarged abdominal or pelvic lymph nodes. Reproductive: Uterus and adnexal structures are unremarkable. Other: There is a small volume of pelvic ascites. No peritoneal nodularity identified. Musculoskeletal: No aggressive lytic or sclerotic bone lesions identified. IMPRESSION: 1. Mass involving the pancreatic neck and body has increased in size from the previous exam dated 07/17/2015. There is progressive involvement of the superior mesenteric  artery. Additionally, there is progressive mass effect upon the common bile duct with interval increase in common bile duct and intrahepatic bile duct dilatation. 2. Aortic atherosclerosis. Electronically Signed   By: Kerby Moors M.D.   On: 10/29/2015 15:21    ASSESSMENT & PLAN:  82 year old Caucasian female, presented with intermittent abdominal pain, weight loss, and a CT finding of a pancreatic mass.  1. Pancreatic body/head adenocarcinoma, well differentiated, cT2N1M0, stage IIB, unresectable -I previously reviewed her CT chest results, which was negative for metastatic disease. -The biopsy results was reviewed with her and her family members in detail. -Her case was discussed in our GI tumor Board yesterday, Dr. Barry Dienes feels this is not resectable disease. -I reviewed the nature history of pancreatic cancer, which is aggressive. Her disease is incurable at this stage, and the  goal of therapy is palliative and prolong her life. -she tolerated first line chemo gemcitabine poorly, with diarrhea, poor appetite, and skin rash, and was switched to second line Xeloda. - I reviewed her restaging CT scan from 6/26, which unfortunately showed disease progression in pancreas, no other metastasis.  -I recommend her to stop Xeloda. I discussed further treatment options, including third line chemotherapy, palliative radiation, and supportive care alone. I think the likelihood of response to third line chemotherapy is low, and she has poor tolerance to IV chemo. Given her worsening abdominal pain, I recommend her to consider palliative RT. potential benefit and side effects were discussed with her. She agrees to see radiation oncologist Dr. Lisbeth Renshaw. I'll set up the referral  2. Left LE DVT -Probably provoked by her underlying malignancy and chemotherapy -I recommend anticoagulation indefinitely, giving her incurable malignancy, if no contraindications such as bleeding. -She had a mild GI bleeding when she was on Coumadin with supratherapeutic INR, which has been held. -I discussed the options, I think Xarelto is reasonable option, I discussed the risk of bleeding and the fact of no effective therapy to reverse Xarelto when she has severe bleeding. Fall precaution were discussed with her and her family members. She agrees to proceed.  3. Fatigue, anorexia, and abdominal pain -improved some, she'll continue tramadol as needed.  -We also discussed other pain medication options. Given her worsening abdominal pain, I encouraged her to try Vicodin, prescription was given to her today.  4.HTN, DM, hypothyroidism  -follow up with PCP   Plan -stop Xeloda due to disease progression -Radiation oncology referral for palliative radiation to pancreatic cancer  -She will start Xarelto -She will continue tramadol, and to try Vicodin. I given her prescription of Vicodin 5/325mg  today -I will see  her back in 3 weeks for follow up  All questions were answered. The patient knows to call the clinic with any problems, questions or concerns.  I spent 30 minutes counseling the patient face to face. The total time spent in the appointment was 35 minutes and more than 50% was on counseling.    [name], MD 10/30/2015

## 2015-11-02 ENCOUNTER — Telehealth: Payer: Self-pay | Admitting: *Deleted

## 2015-11-02 NOTE — Telephone Encounter (Signed)
Pt called and left message requesting a call back from nurse.   Spoke with pt and was informed that pt started taking Hydrocodone-APAP 5/325  - 1 tab every 6 hours as needed for pain since 6/27.   Stated she did have relief of pain but not lasting long.  Pt wanted to go back to Tramadol since it did give longer time for pain relief. Instructed pt to increase Vicodin to 2 tabs every 6 hours as needed.  Reinforced with pt to monitor for constipation with pain meds.  Instructed pt to call office back on Mon 11/05/15 to give nurse update on pain issue.  Pt voiced understanding. Dr. Burr Medico notified of above info.   MD agreed with nurse's suggestions to pt.  No new order received. Pt's    Phone      [phone].

## 2015-11-05 ENCOUNTER — Telehealth: Payer: Self-pay | Admitting: *Deleted

## 2015-11-05 ENCOUNTER — Other Ambulatory Visit: Payer: Self-pay | Admitting: Nurse Practitioner

## 2015-11-05 ENCOUNTER — Other Ambulatory Visit: Payer: Self-pay | Admitting: *Deleted

## 2015-11-05 DIAGNOSIS — C259 Malignant neoplasm of pancreas, unspecified: Secondary | ICD-10-CM

## 2015-11-05 MED ORDER — HYDROCODONE-ACETAMINOPHEN 5-325 MG PO TABS: 2.0000 | ORAL_TABLET | Freq: Four times a day (QID) | ORAL | Status: DC | PRN

## 2015-11-05 NOTE — Telephone Encounter (Signed)
Patient called stating that that change of prescription to 2 tabs every 6 hours is much better. With the change, patient is down to 6 tables and needs a refill. Please call patient when it is ready for pick-up. Home telephone preferred. Message sent to RN [facility]

## 2015-11-09 ENCOUNTER — Telehealth: Payer: Self-pay | Admitting: *Deleted

## 2015-11-09 NOTE — Telephone Encounter (Signed)
Patient's daughter Nicole Sherman called asking for [facility].  "Called Monday about Pain medicine.  She was changed to Hydrocodone 5-325 mg.  Now taking one pill every three hours instead of 2 pills every six hours.  Says she'd be knocked out all day if she takes two at one time.  She still c/o pain before and after she eats.  Has to lie down more and odd that this week is worse than in the past.  Monday she is to meet Dr. Lisbeth Renshaw.  Is this pain normal or is something else going on?  Return number [phone]."

## 2015-11-09 NOTE — Progress Notes (Signed)
GI Location of Tumor / Histology: Pancreatic Cancer  Nicole Sherman presented in 2016  Followed by chemotherapy starting 06/05/15 - 07/03/15, weekly gemcitabine followed by Xeloda which started on 08/27/15. CT evidence on 10/29/15 increased size of the Mass since 07/17/15.  Biopsies of  Pancreas 05/28/15  Diagnosis FINE NEEDLE ASPIRATION: NEEDLE ASPIRATION, PANCREAS BODY (SPECIMEN 1 OF 1 COLLECTED 05/28/15): WELL DIFFERENTIATED ADENOCARCINOMA.   Past/Anticipated interventions by surgeon, if any: Biopsy of the pancrease  Past/Anticipated interventions by medical oncology, if any: Dr. [name] -  Chemotherapy  Weight changes, if any: 10/30/15 note 10-20 lb weight loss in the past 5 months  Bowel/Bladder complaints, if any: GI bleeding 2 weeks prior to 10/30/15.  None as of 10/30/15  Bladder normal, constipation, takes 3 colace at night Nausea / Vomiting, if any: nausea, took zofran this am  Pain issues, if any:  10/30/15 - as of 10/30/15 [name] reported epigastric pain for the past 4-5 months, and left side pain at night when sleeping on left side, mild back pain, increasing  Any blood per rectum: GI bleeding 2 weeks prior to 10/30/15 - Blood Transfusion.  None as of 10/30/15  SAFETY ISSUES: yes unsteady, has walker at home  Prior radiation? No  Pacemaker/ICD? No  Pregnancy? No  Is the patient on methotrexate? No  Current Complaints/Details:Married, 2 daughters, Paternal niece breast  Cancer, sister Lung cancer,smoker, surgery, then metastasized, brothers died before age 47 unknown,  BP 137/69 mmHg  Pulse 79  Temp(Src) 97.5 F (36.4 C) (Oral)  Resp 16  Ht 5' 6.5" (1.689 m)  Wt 137 lb 8 oz (62.37 kg)  BMI 21.86 kg/m2  Wt Readings from Last 3 Encounters:  11/12/15 137 lb 8 oz (62.37 kg)  10/30/15 131 lb 4.8 oz (59.557 kg)  10/25/15 131 lb 8 oz (59.648 kg)

## 2015-11-12 ENCOUNTER — Ambulatory Visit
Admission: RE | Admit: 2015-11-12 | Discharge: 2015-11-12 | Disposition: A | Discharge status: Home / Self Care | Payer: Medicare Other | Source: Ambulatory Visit | Attending: Radiation Oncology | Admitting: Radiation Oncology

## 2015-11-12 ENCOUNTER — Encounter: Payer: Self-pay | Admitting: Radiation Oncology

## 2015-11-12 VITALS — BP 137/69 | HR 79 | Temp 97.5°F | Resp 16 | Ht 66.5 in | Wt 137.5 lb

## 2015-11-12 DIAGNOSIS — C251 Malignant neoplasm of body of pancreas: Secondary | ICD-10-CM | POA: Insufficient documentation

## 2015-11-12 DIAGNOSIS — Z510 Encounter for antineoplastic radiation therapy: Secondary | ICD-10-CM | POA: Insufficient documentation

## 2015-11-12 DIAGNOSIS — C259 Malignant neoplasm of pancreas, unspecified: Secondary | ICD-10-CM

## 2015-11-12 NOTE — Progress Notes (Signed)
Please see the Nurse Progress Note in the MD Initial Consult Encounter for this patient. 

## 2015-11-12 NOTE — Progress Notes (Signed)
Radiation Oncology         ([phone]) [phone] ________________________________  Name: Nicole Sherman MRN: [id number]  Date: 11/12/2015  DOB: 11/01/1933  [id number] [name], MD  [name], MD     REFERRING PHYSICIAN: [name], MD   DIAGNOSIS: The primary encounter diagnosis was Malignant neoplasm of body of pancreas ([place]). A diagnosis of Carcinoma of body of pancreas ([place]) was also pertinent to this visit.  Unresectable stage IIB cT2N1M0 pancreatic adenocarcinoma, well differentiated  HISTORY OF PRESENT ILLNESS: Nicole Sherman is a 82 y.o. female seen at the request of Dr. Burr Medico. The patient had a 4-5 month history of epigastric pain after meals, left sided flank pain, weight loss of 10-20 lbs, and loose stools. The patient was evaluated by her PCP Dr. Rex Kras and abdominal ultrasound on 05/04/15 was negative. CT of the abd/pelvis on 05/16/15 showed a 4.1 cm mass in the pancreatic body and neck. Tumor encases the distal celiac and proximal branches and occludes the main portal vein. No distant metastasis was noted on the CT scan. She was referred to Dr. Burr Medico for workup.  Biopsy of the mass, on 05/28/15, revealed well differentiated adenocarcinoma. The patient's case was discussed in GI tumor board and Dr. Barry Dienes felt that the disease was not resectable.  From 06/05/15 - 07/03/15, the patient received weekly Gemcitabine 1000mg /m2 with the dose reduced to 850mg /m2 on week 2 due to tolerance issues.  CT scan of the chest/abd/pelvis on 07/17/15 showed the mass has slightly decreased in size to 3.7 x 2.8 cm.  From 08/27/15 the patient was taking Xeloda 1000mg  bid, 14 days on, 7 days off. Xeloda was stopped on 10/30/15 due to disease progression noted on a CT scan on 10/29/15.  Dr. Burr Medico discussed further treatment options, including third line chemotherapy, palliative radiation, and supportive care alone. Dr. Burr Medico believes the likelihood of response to third line chemotherapy is low and the patient has poor  tolerance to IV chemo. Given her worsening abdominal pain, Dr. Burr Medico recommend her to consider palliative RT. The patient, her husband, and two daughters present today to discuss the role of radiation in the management of her disease.  PREVIOUS RADIATION THERAPY: No   PAST MEDICAL HISTORY:  Past Medical History  Diagnosis Date  . Hypertension   . Hypothyroidism   . Diabetes mellitus without complication ([place])   . Arthritis     osteoarthritis-hands wrist  . Diverticulosis     showing on CT of abdomen 05-16-15  . Cancer [facility])     pancreatic mass- dx. adenocarcinoma" CT abdomen 05-16-15       PAST SURGICAL HISTORY: Past Surgical History  Procedure Laterality Date  . Joint replacement Right   . Shoulder arthroscopy w/ rotator cuff repair Left   . Shoulder open rotator cuff repair Right   . Tubal ligation    . Appendectomy      open  . Ankle surgery Right   . Eye surgery      macular hole surgery repair  . Cataract extraction Right   . Eus N/A 05/28/2015    Procedure: UPPER ENDOSCOPIC ULTRASOUND (EUS) LINEAR;  Surgeon: Arta Silence, MD;  Location: [facility] ENDOSCOPY;  Service: Endoscopy;  Laterality: N/A;     FAMILY HISTORY:  Family History  Problem Relation Age of Onset  . Stroke Mother   . Cancer Sister     lung cancer      SOCIAL HISTORY:  reports that she quit smoking about 29 years ago. Her smoking  use included Cigarettes. She has a 30 pack-year smoking history. She does not have any smokeless tobacco history on file. She reports that she does not drink alcohol or use illicit drugs.   ALLERGIES: Keflex and Aspirin   MEDICATIONS:  Current Outpatient Prescriptions  Medication Sig Dispense Refill  . docusate sodium (COLACE) 100 MG capsule Take 100 mg by mouth 3 (three) times daily.    Marland Kitchen HYDROcodone-acetaminophen (NORCO/VICODIN) 5-325 MG tablet Take 2 tablets by mouth every 6 (six) hours as needed for moderate pain. 60 tablet 0  . levothyroxine (SYNTHROID, LEVOTHROID)  100 MCG tablet Take 100 mcg by mouth daily before breakfast.     . metFORMIN (GLUCOPHAGE) 1000 MG tablet Take 1,000 mg by mouth 2 (two) times daily with a meal.     . ondansetron (ZOFRAN) 8 MG tablet Take 1 tablet (8 mg total) by mouth 2 (two) times daily as needed (Nausea or vomiting). 30 tablet 1  . rivaroxaban (XARELTO) 20 MG TABS tablet Take 1 tablet (20 mg total) by mouth daily with supper. 30 tablet 0  . omeprazole (PRILOSEC OTC) 20 MG tablet Take 20 mg by mouth daily as needed (acid reflux). Reported on 11/12/2015     No current facility-administered medications for this encounter.     REVIEW OF SYSTEMS: On review of systems, the patient reports that she is doing well overall. She denies any chest pain, shortness of breath, cough, fevers, chills, night sweats, unintended weight changes. She denies any bowel or bladder disturbances. She denies any new musculoskeletal or joint aches or pains. More nausea since switching pain medications (tramadol to vicodin), abdominal pain, and a poor appetite.  PHYSICAL EXAM:  height is 5' 6.5" (1.689 m) and weight is 137 lb 8 oz (62.37 kg). Her oral temperature is 97.5 F (36.4 C). Her blood pressure is 137/69 and her pulse is 79. Her respiration is 16.  In general this is a well appearing Caucasian female in no acute distress. She is alert and oriented x4 and appropriate throughout the examination. HEENT reveals that the patient is normocephalic, atraumatic. EOMs are intact. PERRLA. Skin is intact without any evidence of gross lesions. Cardiovascular exam reveals a regular rate and rhythm, no clicks rubs or murmurs are auscultated. Chest is clear to auscultation bilaterally. Abdomen has active bowel sounds in all quadrants and is intact. The abdomen is soft, non tender, non distended. Lower extremities are negative for pretibial pitting edema, deep calf tenderness, cyanosis or clubbing.  ECOG = 1  0 - Asymptomatic (Fully active, able to carry on all  predisease activities without restriction)  1 - Symptomatic but completely ambulatory (Restricted in physically strenuous activity but ambulatory and able to carry out work of a light or sedentary nature. For example, light housework, office work)  2 - Symptomatic, <50% in bed during the day (Ambulatory and capable of all self care but unable to carry out any work activities. Up and about more than 50% of waking hours)  3 - Symptomatic, >50% in bed, but not bedbound (Capable of only limited self-care, confined to bed or chair 50% or more of waking hours)  4 - Bedbound (Completely disabled. Cannot carry on any self-care. Totally confined to bed or chair)  5 - Death   Eustace Pen MM, Creech RH, Tormey DC, et al. [phone]). "Toxicity and response criteria of the [facility]". [place] Oncol. 5 (6): 649-55    LABORATORY DATA:  Lab Results  Component Value Date  WBC 4.2 10/29/2015   HGB 9.2* 10/29/2015   HCT 28.9* 10/29/2015   MCV 96.7 10/29/2015   PLT 224 10/29/2015   Lab Results  Component Value Date   NA 137 10/29/2015   K 3.7 10/29/2015   CL 97* 06/08/2015   CO2 24 10/29/2015   Lab Results  Component Value Date   ALT 9 10/29/2015   AST 11 10/29/2015   ALKPHOS 101 10/29/2015   BILITOT 0.40 10/29/2015      RADIOGRAPHY: Ct Abdomen Pelvis W Wo Contrast  10/29/2015  CLINICAL DATA:  Followup pancreas cancer EXAM: CT CHEST WITH CONTRAST CT ABDOMEN AND PELVIS WITH AND WITHOUT CONTRAST TECHNIQUE: Multidetector CT imaging of the chest was performed during intravenous contrast administration. Multidetector CT imaging of the abdomen and pelvis was performed following the standard protocol before and during bolus administration of intravenous contrast. CONTRAST:  147mL ISOVUE-300 IOPAMIDOL (ISOVUE-300) INJECTION 61% COMPARISON:  07/17/2015 FINDINGS: CT CHEST FINDINGS Mediastinum/Lymph Nodes: The heart size appears normal. There is no pericardial effusion identified.  Aortic atherosclerosis. Calcification involving the LAD, RCA and left circumflex coronary artery noted. No enlarged axillary or supraclavicular lymph nodes. No mediastinal or hilar adenopathy. Lungs/Pleura: There is no pleural fluid. Moderate changes of centrilobular emphysema identified. No suspicious pulmonary nodule or mass noted. Musculoskeletal: No chest wall mass or suspicious bone lesions identified. CT ABDOMEN PELVIS FINDINGS Hepatobiliary: No suspicious liver abnormality identified. Cyst is noted within the inferior right lobe of liver measuring 2.2 cm, image 75 of series 5. Moderate intrahepatic bile duct dilatation is identified. The proximal common bile duct is dilated measuring 1.9 cm. Marked narrowing of the distal common bile duct is again and has progressed when compared with 07/17/2015. Pancreas: Tumor involving the pancreatic neck and body measures 5.1 x 5.1 x 4.2 cm. Previously this measured 3.7 x 2.8 by 4.1 cm. Encasement of the celiac artery is again noted and appears similar to previous exam. Progressive involvement of the superior mesenteric artery is identified. There is narrowing of the portal vein at the level of the portal venous confluence. Chronic obstruction of the splenic vein noted. Spleen: Within normal limits in size and appearance. Adrenals/Urinary Tract: Normal appearance of the adrenal glands. The kidneys are unremarkable. Urinary bladder appears normal. Stomach/Bowel: The stomach is normal. No pathologic dilatation of the large or small bowel loops. Numerous colonic diverticula identified without acute inflammation. Vascular/Lymphatic: Calcified atherosclerotic disease involves the abdominal aorta. No aneurysm. Gastric and splenic varices are identified. There are no enlarged abdominal or pelvic lymph nodes. Reproductive: Uterus and adnexal structures are unremarkable. Other: There is a small volume of pelvic ascites. No peritoneal nodularity identified. Musculoskeletal: No  aggressive lytic or sclerotic bone lesions identified. IMPRESSION: 1. Mass involving the pancreatic neck and body has increased in size from the previous exam dated 07/17/2015. There is progressive involvement of the superior mesenteric artery. Additionally, there is progressive mass effect upon the common bile duct with interval increase in common bile duct and intrahepatic bile duct dilatation. 2. Aortic atherosclerosis. Electronically Signed   By: Kerby Moors M.D.   On: 10/29/2015 15:21   Ct Chest W Contrast  10/29/2015  CLINICAL DATA:  Followup pancreas cancer EXAM: CT CHEST WITH CONTRAST CT ABDOMEN AND PELVIS WITH AND WITHOUT CONTRAST TECHNIQUE: Multidetector CT imaging of the chest was performed during intravenous contrast administration. Multidetector CT imaging of the abdomen and pelvis was performed following the standard protocol before and during bolus administration of intravenous contrast. CONTRAST:  175mL ISOVUE-300 IOPAMIDOL (ISOVUE-300)  INJECTION 61% COMPARISON:  07/17/2015 FINDINGS: CT CHEST FINDINGS Mediastinum/Lymph Nodes: The heart size appears normal. There is no pericardial effusion identified. Aortic atherosclerosis. Calcification involving the LAD, RCA and left circumflex coronary artery noted. No enlarged axillary or supraclavicular lymph nodes. No mediastinal or hilar adenopathy. Lungs/Pleura: There is no pleural fluid. Moderate changes of centrilobular emphysema identified. No suspicious pulmonary nodule or mass noted. Musculoskeletal: No chest wall mass or suspicious bone lesions identified. CT ABDOMEN PELVIS FINDINGS Hepatobiliary: No suspicious liver abnormality identified. Cyst is noted within the inferior right lobe of liver measuring 2.2 cm, image 75 of series 5. Moderate intrahepatic bile duct dilatation is identified. The proximal common bile duct is dilated measuring 1.9 cm. Marked narrowing of the distal common bile duct is again and has progressed when compared with  07/17/2015. Pancreas: Tumor involving the pancreatic neck and body measures 5.1 x 5.1 x 4.2 cm. Previously this measured 3.7 x 2.8 by 4.1 cm. Encasement of the celiac artery is again noted and appears similar to previous exam. Progressive involvement of the superior mesenteric artery is identified. There is narrowing of the portal vein at the level of the portal venous confluence. Chronic obstruction of the splenic vein noted. Spleen: Within normal limits in size and appearance. Adrenals/Urinary Tract: Normal appearance of the adrenal glands. The kidneys are unremarkable. Urinary bladder appears normal. Stomach/Bowel: The stomach is normal. No pathologic dilatation of the large or small bowel loops. Numerous colonic diverticula identified without acute inflammation. Vascular/Lymphatic: Calcified atherosclerotic disease involves the abdominal aorta. No aneurysm. Gastric and splenic varices are identified. There are no enlarged abdominal or pelvic lymph nodes. Reproductive: Uterus and adnexal structures are unremarkable. Other: There is a small volume of pelvic ascites. No peritoneal nodularity identified. Musculoskeletal: No aggressive lytic or sclerotic bone lesions identified. IMPRESSION: 1. Mass involving the pancreatic neck and body has increased in size from the previous exam dated 07/17/2015. There is progressive involvement of the superior mesenteric artery. Additionally, there is progressive mass effect upon the common bile duct with interval increase in common bile duct and intrahepatic bile duct dilatation. 2. Aortic atherosclerosis. Electronically Signed   By: Kerby Moors M.D.   On: 10/29/2015 15:21    IMPRESSION: Unresectable stage IIB cT2N1M0 pancreatic adenocarcinoma, well differentiated  The patient is a good candidate for 10 fractions of palliative radiation to the pancreatic mass. I would plan to deliver at least 30 gray in 10 fractions. During the planning process, we can see to what degree  dose escalation would be feasible, especially since the patient does not have additional regional or distant disease evident at this time. We discussed the logistics of radiation treatment. We discussed the process of CT simulation and the placement tattoos. We discussed the low likelihood of secondary malignancies. We discussed the possible side effects including but not limited to skin irritation, fatigue, nausea, and/or loose stools.  PLAN: The patient agreed with proceeding with radiation treatment. She signed a consent form and this was placed in her medical chart. She will be scheduled for CT simulation at a later date.  The above documentation reflects my direct findings during this shared patient visit. Please see the separate note by Dr. Lisbeth Renshaw on this date for the remainder of the patient's plan of care.  ------------------------------------------------  Jodelle Gross, MD, PhD  This document serves as a record of services personally performed by Kyung Rudd, MD. It was created on his behalf by Darcus Austin, a trained medical scribe. The creation of this  record is based on the scribe's personal observations and the provider's statements to them. This document has been checked and approved by the attending provider.

## 2015-11-13 ENCOUNTER — Telehealth: Payer: Self-pay | Admitting: *Deleted

## 2015-11-13 ENCOUNTER — Encounter: Payer: Self-pay | Admitting: Hematology

## 2015-11-13 ENCOUNTER — Ambulatory Visit (HOSPITAL_BASED_OUTPATIENT_CLINIC_OR_DEPARTMENT_OTHER): Payer: Medicare Other | Admitting: Hematology

## 2015-11-13 ENCOUNTER — Ambulatory Visit (HOSPITAL_BASED_OUTPATIENT_CLINIC_OR_DEPARTMENT_OTHER): Payer: Medicare Other

## 2015-11-13 VITALS — BP 134/77 | HR 95 | Temp 98.4°F | Resp 18 | Ht 66.5 in | Wt 137.2 lb

## 2015-11-13 DIAGNOSIS — D630 Anemia in neoplastic disease: Secondary | ICD-10-CM

## 2015-11-13 DIAGNOSIS — K922 Gastrointestinal hemorrhage, unspecified: Secondary | ICD-10-CM | POA: Diagnosis not present

## 2015-11-13 DIAGNOSIS — C251 Malignant neoplasm of body of pancreas: Secondary | ICD-10-CM

## 2015-11-13 DIAGNOSIS — Z7901 Long term (current) use of anticoagulants: Secondary | ICD-10-CM

## 2015-11-13 DIAGNOSIS — E039 Hypothyroidism, unspecified: Secondary | ICD-10-CM

## 2015-11-13 DIAGNOSIS — K625 Hemorrhage of anus and rectum: Secondary | ICD-10-CM

## 2015-11-13 DIAGNOSIS — E119 Type 2 diabetes mellitus without complications: Secondary | ICD-10-CM

## 2015-11-13 DIAGNOSIS — I10 Essential (primary) hypertension: Secondary | ICD-10-CM

## 2015-11-13 DIAGNOSIS — I824Z2 Acute embolism and thrombosis of unspecified deep veins of left distal lower extremity: Secondary | ICD-10-CM

## 2015-11-13 LAB — CBC WITH DIFFERENTIAL/PLATELET
BASO%: 0.4 % (ref 0.0–2.0)
BASOS ABS: 0 10*3/uL (ref 0.0–0.1)
EOS ABS: 0.2 10*3/uL (ref 0.0–0.5)
EOS%: 3.7 % (ref 0.0–7.0)
HCT: 25.1 % — ABNORMAL LOW (ref 34.8–46.6)
HEMOGLOBIN: 8.1 g/dL — AB (ref 11.6–15.9)
LYMPH%: 34.2 % (ref 14.0–49.7)
MCH: 30.5 pg (ref 25.1–34.0)
MCHC: 32.3 g/dL (ref 31.5–36.0)
MCV: 94.4 fL (ref 79.5–101.0)
MONO#: 0.6 10*3/uL (ref 0.1–0.9)
MONO%: 10.7 % (ref 0.0–14.0)
NEUT%: 51 % (ref 38.4–76.8)
NEUTROS ABS: 2.6 10*3/uL (ref 1.5–6.5)
Platelets: 226 10*3/uL (ref 145–400)
RBC: 2.66 10*6/uL — AB (ref 3.70–5.45)
RDW: 24.9 % — ABNORMAL HIGH (ref 11.2–14.5)
WBC: 5.2 10*3/uL (ref 3.9–10.3)
lymph#: 1.8 10*3/uL (ref 0.9–3.3)

## 2015-11-13 LAB — BASIC METABOLIC PANEL
Anion Gap: 9 mEq/L (ref 3–11)
BUN: 15.6 mg/dL (ref 7.0–26.0)
CHLORIDE: 107 meq/L (ref 98–109)
CO2: 24 mEq/L (ref 22–29)
Calcium: 8.6 mg/dL (ref 8.4–10.4)
Creatinine: 1.1 mg/dL (ref 0.6–1.1)
EGFR: 48 mL/min/{1.73_m2} — AB (ref >90)
Glucose: 194 mg/dl — ABNORMAL HIGH (ref 70–140)
POTASSIUM: 3.6 meq/L (ref 3.5–5.1)
SODIUM: 140 meq/L (ref 136–145)

## 2015-11-13 MED ORDER — RIVAROXABAN 15 MG PO TABS: 15.0000 mg | ORAL_TABLET | Freq: Two times a day (BID) | ORAL | Status: DC

## 2015-11-13 NOTE — Progress Notes (Signed)
[place]  [phone]) [phone] [phone]) [phone]  Clinic Follow Up Note   Patient Care Team: Hulan Fess, MD as PCP - General (Family Medicine) [name], MD as Consulting Physician (Hematology) Arta Silence, MD as Consulting Physician (Gastroenterology) 11/13/2015  CHIEF COMPLAINTS:  Follow up unresectable pancreatic adenocarcinoma    OTHER RELATED ISSUES 1. Left LE DVT diagnosed on 06/09/2015, started on lovenox and bridged to coumadin, which was held on 10/22/2068 due to GI bleeding. Changed to Xarelto on 10/31/2015.   Oncology History   Pancreatic cancer [facility])   Staging form: Pancreas, AJCC 7th Edition     Clinical stage from 05/27/2014: Stage IIB (T2, N1, M0) - Signed by [name], MD on 05/31/2015       Carcinoma of body of pancreas ([place])   05/16/2015 Imaging CT chest, abdomen and pelvis with contrast showed a bulky mass at pancreatic neck/body, tumor encases the celiac and proximal branches and a cruise the main portal vein, peripancreatic adenopathy. no distant mets.    05/28/2015 Initial Biopsy Pancreatic mass needle biopsy showed well differentiated adenocarcinoma   05/31/2015 Initial Diagnosis Pancreatic cancer ([place])   05/31/2015 Tumor Marker CA19.9 =375   06/05/2015 - 07/03/2015 Chemotherapy weekly gemcitabine 1000mg /m2, dose reduced to 850mg /m2 on week 2 due to tolerance issue   08/27/2015 -  Chemotherapy xeloda 1000mg  bid, 14 days on, 7 days off    HISTORY OF PRESENTING ILLNESS:  HARLEEN BELLAVANCE 82 y.o. female is here because of her recent abnormal CT scan, which showed a pink vaginal mass, highly suspicious for pancreatic cancer.  She has been haivng epigastric pain after meal for 4-5 months. She also reports left side pain at night when she sleeps on left side, mild back pain, the pain is worse lately, lasts longer especially afte rdinner, it about 5/10, appetite is lower than before, she lost aobut 10-20lbs in the past 5 months. No other complains,  she had loose BM 1-2 time BM for 8-10 months, no hematochezia or melena. She was evaluated by her primary care physician Dr. Rex Kras, abdominal ultrasound was negative on 05/04/2015. She underwent a CT abdomen and pelvis with contrast on 05/16/2015, which showed a 4 x 1 cm mass in pancreatic body and a neck, tumor encases the distal celiac and proximal branches and occludes the main portal vein. No distant metastasis on the CT scan. She was referred to [place] for further workup.  She has good energy level, she is able to do all house work. She has intermittent dizziness from blood pressure meds. No other complains.   CURRENT THERAPY: Xeloda 1000mg  bid, 14 days on, 7 days off, started on 08/27/2015, stopped on 10/30/2015 due to disease progression.  INTERIM HISTORY: Totsie called this morning to report 1 episode of GI bleeding, and came in this afternoon for an urgent visit. She is accompanied her husband and daughter Jackelyn Poling. She has been having loose stool twice a day for the past few weeks, sometime pretty dark, no frank blood until this morning that she noticed small the toilet amount of red blood in  toilet and on the tissue. She denies significant nausea, abdominal discomfort, or other new symptoms. She actually felt pretty good this morning, no worsening fatigue. Her pain has been better controlled daily, she takes Vicodin twice a day. She was seen by radiation oncologist Dr. Lisbeth Renshaw yesterday, and will start radiation soon.    MEDICAL HISTORY:  Past Medical History  Diagnosis Date  . Hypertension   . Hypothyroidism   .  Diabetes mellitus without complication ([place])   . Arthritis     osteoarthritis-hands wrist  . Diverticulosis     showing on CT of abdomen 05-16-15  . Cancer [facility])     pancreatic mass- dx. adenocarcinoma" CT abdomen 05-16-15    SURGICAL HISTORY: Past Surgical History  Procedure Laterality Date  . Joint replacement Right   . Shoulder arthroscopy w/ rotator cuff repair Left   .  Shoulder open rotator cuff repair Right   . Tubal ligation    . Appendectomy      open  . Ankle surgery Right   . Eye surgery      macular hole surgery repair  . Cataract extraction Right   . Eus N/A 05/28/2015    Procedure: UPPER ENDOSCOPIC ULTRASOUND (EUS) LINEAR;  Surgeon: Arta Silence, MD;  Location: [facility] ENDOSCOPY;  Service: Endoscopy;  Laterality: N/A;    SOCIAL HISTORY: Social History   Social History  . Marital Status: Married    Spouse Name: N/A  . Number of Children: 2 daughters   . Years of Education: N/A   Occupational History  . Not on file.   Social History Main Topics  . Smoking status: Former Smoker -- 1.00 packs/day for 30 years    Types: Cigarettes    Quit date: 05/21/1986  . Smokeless tobacco: Not on file  . Alcohol Use: No  . Drug Use: No  . Sexual Activity: Not on file   Other Topics Concern  . Not on file   Social History Narrative    FAMILY HISTORY: Family History  Problem Relation Age of Onset  . Stroke Mother   . Cancer Sister     lung cancer     ALLERGIES:  is allergic to keflex and aspirin.  MEDICATIONS:  Current Outpatient Prescriptions  Medication Sig Dispense Refill  . docusate sodium (COLACE) 100 MG capsule Take 100 mg by mouth 3 (three) times daily.    Marland Kitchen HYDROcodone-acetaminophen (NORCO/VICODIN) 5-325 MG tablet Take 2 tablets by mouth every 6 (six) hours as needed for moderate pain. 60 tablet 0  . levothyroxine (SYNTHROID, LEVOTHROID) 100 MCG tablet Take 100 mcg by mouth daily before breakfast.     . metFORMIN (GLUCOPHAGE) 1000 MG tablet Take 1,000 mg by mouth 2 (two) times daily with a meal.     . omeprazole (PRILOSEC OTC) 20 MG tablet Take 20 mg by mouth daily as needed (acid reflux). Reported on 11/12/2015    . ondansetron (ZOFRAN) 8 MG tablet Take 1 tablet (8 mg total) by mouth 2 (two) times daily as needed (Nausea or vomiting). 30 tablet 1  . Rivaroxaban (XARELTO) 15 MG TABS tablet Take 1 tablet (15 mg total) by mouth 2  (two) times daily with a meal. 30 tablet 0   No current facility-administered medications for this visit.    REVIEW OF SYSTEMS:   Constitutional: Denies fevers, chills or abnormal night sweats Eyes: Denies blurriness of vision, double vision or watery eyes Ears, nose, mouth, throat, and face: Denies mucositis or sore throat Respiratory: Denies cough, dyspnea or wheezes Cardiovascular: Denies palpitation, chest discomfort or lower extremity swelling Gastrointestinal:  Denies nausea, heartburn or change in bowel habits Skin: Denies abnormal skin rashes Lymphatics: Denies new lymphadenopathy or easy bruising Neurological:Denies numbness, tingling or new weaknesses Behavioral/Psych: Mood is stable, no new changes  All other systems were reviewed with the patient and are negative.  PHYSICAL EXAMINATION: ECOG PERFORMANCE STATUS: 1-2  Filed Vitals:   11/13/15 1451  BP: 134/77  Pulse: 95  Temp: 98.4 F (36.9 C)  Resp: 18   Filed Weights   11/13/15 1451  Weight: 137 lb 3.2 oz (62.234 kg)    GENERAL:alert, no distress and comfortable SKIN: skin color, texture, turgor are normal, mild scatter skin rashes involving both breasts and upper agdomen EYES: normal, conjunctiva are pink and non-injected, sclera clear OROPHARYNX:no exudate, no erythema and lips, buccal mucosa, and tongue normal  NECK: supple, thyroid normal size, non-tender, without nodularity LYMPH:  no palpable lymphadenopathy in the cervical, axillary or inguinal LUNGS: clear to auscultation and percussion with normal breathing effort HEART: regular rate & rhythm and no murmurs and no lower extremity edema ABDOMEN:abdomen soft, non-tender and normal bowel sounds Musculoskeletal:no cyanosis of digits and no clubbing  PSYCH: alert & oriented x 3 with fluent speech NEURO: no focal motor/sensory deficits  LABORATORY DATA:  I have reviewed the data as listed  CBC Latest Ref Rng 11/13/2015 10/29/2015 10/25/2015  WBC 3.9 -  10.3 10e3/uL 5.2 4.2 4.2  Hemoglobin 11.6 - 15.9 g/dL 8.1(L) 9.2(L) 9.8(L)  Hematocrit 34.8 - 46.6 % 25.1(L) 28.9(L) 30.2(L)  Platelets 145 - 400 10e3/uL 226 224 227   CMP Latest Ref Rng 11/13/2015 10/29/2015 10/25/2015  Glucose 70 - 140 mg/dl 194(H) 257(H) 163(H)  BUN 7.0 - 26.0 mg/dL 15.6 15.5 14.0  Creatinine 0.6 - 1.1 mg/dL 1.1 0.9 0.9  Sodium 136 - 145 mEq/L 140 137 138  Potassium 3.5 - 5.1 mEq/L 3.6 3.7 3.8  CO2 22 - 29 mEq/L 24 24 22   Calcium 8.4 - 10.4 mg/dL 8.6 9.0 9.1  Total Protein 6.4 - 8.3 g/dL - 6.3(L) 6.7  Total Bilirubin 0.20 - 1.20 mg/dL - 0.40 0.58  Alkaline Phos 40 - 150 U/L - 101 146  AST 5 - 34 U/L - 11 19  ALT 0 - 55 U/L - 9 13   PT/INR today: 22.8/1.9   CA19.9 u/ML 05/31/2015: 751 07/03/2015: 227 07/19/2015: 159 08/23/2015: 304 09/03/2015: 309 10/15/2015: 357  PATHOLOGY REPORT  Diagnosis 05/28/2015 FINE NEEDLE ASPIRATION: NEEDLE ASPIRATION, PANCREAS BODY (SPECIMEN 1 OF 1 COLLECTED 05/28/15): WELL DIFFERENTIATED ADENOCARCINOMA. Preliminary Diagnosis Intraoperative Diagnosis: Adequate (JDP)   RADIOGRAPHIC STUDIES: I have personally reviewed the radiological images as listed and agreed with the findings in the report. Ct Abdomen Pelvis W Wo Contrast  10/29/2015  CLINICAL DATA:  Followup pancreas cancer EXAM: CT CHEST WITH CONTRAST CT ABDOMEN AND PELVIS WITH AND WITHOUT CONTRAST TECHNIQUE: Multidetector CT imaging of the chest was performed during intravenous contrast administration. Multidetector CT imaging of the abdomen and pelvis was performed following the standard protocol before and during bolus administration of intravenous contrast. CONTRAST:  171mL ISOVUE-300 IOPAMIDOL (ISOVUE-300) INJECTION 61% COMPARISON:  07/17/2015 FINDINGS: CT CHEST FINDINGS Mediastinum/Lymph Nodes: The heart size appears normal. There is no pericardial effusion identified. Aortic atherosclerosis. Calcification involving the LAD, RCA and left circumflex coronary artery noted. No enlarged  axillary or supraclavicular lymph nodes. No mediastinal or hilar adenopathy. Lungs/Pleura: There is no pleural fluid. Moderate changes of centrilobular emphysema identified. No suspicious pulmonary nodule or mass noted. Musculoskeletal: No chest wall mass or suspicious bone lesions identified. CT ABDOMEN PELVIS FINDINGS Hepatobiliary: No suspicious liver abnormality identified. Cyst is noted within the inferior right lobe of liver measuring 2.2 cm, image 75 of series 5. Moderate intrahepatic bile duct dilatation is identified. The proximal common bile duct is dilated measuring 1.9 cm. Marked narrowing of the distal common bile duct is again and has progressed when compared with 07/17/2015. Pancreas: Tumor  involving the pancreatic neck and body measures 5.1 x 5.1 x 4.2 cm. Previously this measured 3.7 x 2.8 by 4.1 cm. Encasement of the celiac artery is again noted and appears similar to previous exam. Progressive involvement of the superior mesenteric artery is identified. There is narrowing of the portal vein at the level of the portal venous confluence. Chronic obstruction of the splenic vein noted. Spleen: Within normal limits in size and appearance. Adrenals/Urinary Tract: Normal appearance of the adrenal glands. The kidneys are unremarkable. Urinary bladder appears normal. Stomach/Bowel: The stomach is normal. No pathologic dilatation of the large or small bowel loops. Numerous colonic diverticula identified without acute inflammation. Vascular/Lymphatic: Calcified atherosclerotic disease involves the abdominal aorta. No aneurysm. Gastric and splenic varices are identified. There are no enlarged abdominal or pelvic lymph nodes. Reproductive: Uterus and adnexal structures are unremarkable. Other: There is a small volume of pelvic ascites. No peritoneal nodularity identified. Musculoskeletal: No aggressive lytic or sclerotic bone lesions identified. IMPRESSION: 1. Mass involving the pancreatic neck and body has  increased in size from the previous exam dated 07/17/2015. There is progressive involvement of the superior mesenteric artery. Additionally, there is progressive mass effect upon the common bile duct with interval increase in common bile duct and intrahepatic bile duct dilatation. 2. Aortic atherosclerosis. Electronically Signed   By: Kerby Moors M.D.   On: 10/29/2015 15:21   Ct Chest W Contrast  10/29/2015  CLINICAL DATA:  Followup pancreas cancer EXAM: CT CHEST WITH CONTRAST CT ABDOMEN AND PELVIS WITH AND WITHOUT CONTRAST TECHNIQUE: Multidetector CT imaging of the chest was performed during intravenous contrast administration. Multidetector CT imaging of the abdomen and pelvis was performed following the standard protocol before and during bolus administration of intravenous contrast. CONTRAST:  123mL ISOVUE-300 IOPAMIDOL (ISOVUE-300) INJECTION 61% COMPARISON:  07/17/2015 FINDINGS: CT CHEST FINDINGS Mediastinum/Lymph Nodes: The heart size appears normal. There is no pericardial effusion identified. Aortic atherosclerosis. Calcification involving the LAD, RCA and left circumflex coronary artery noted. No enlarged axillary or supraclavicular lymph nodes. No mediastinal or hilar adenopathy. Lungs/Pleura: There is no pleural fluid. Moderate changes of centrilobular emphysema identified. No suspicious pulmonary nodule or mass noted. Musculoskeletal: No chest wall mass or suspicious bone lesions identified. CT ABDOMEN PELVIS FINDINGS Hepatobiliary: No suspicious liver abnormality identified. Cyst is noted within the inferior right lobe of liver measuring 2.2 cm, image 75 of series 5. Moderate intrahepatic bile duct dilatation is identified. The proximal common bile duct is dilated measuring 1.9 cm. Marked narrowing of the distal common bile duct is again and has progressed when compared with 07/17/2015. Pancreas: Tumor involving the pancreatic neck and body measures 5.1 x 5.1 x 4.2 cm. Previously this measured 3.7  x 2.8 by 4.1 cm. Encasement of the celiac artery is again noted and appears similar to previous exam. Progressive involvement of the superior mesenteric artery is identified. There is narrowing of the portal vein at the level of the portal venous confluence. Chronic obstruction of the splenic vein noted. Spleen: Within normal limits in size and appearance. Adrenals/Urinary Tract: Normal appearance of the adrenal glands. The kidneys are unremarkable. Urinary bladder appears normal. Stomach/Bowel: The stomach is normal. No pathologic dilatation of the large or small bowel loops. Numerous colonic diverticula identified without acute inflammation. Vascular/Lymphatic: Calcified atherosclerotic disease involves the abdominal aorta. No aneurysm. Gastric and splenic varices are identified. There are no enlarged abdominal or pelvic lymph nodes. Reproductive: Uterus and adnexal structures are unremarkable. Other: There is a small volume of pelvic ascites.  No peritoneal nodularity identified. Musculoskeletal: No aggressive lytic or sclerotic bone lesions identified. IMPRESSION: 1. Mass involving the pancreatic neck and body has increased in size from the previous exam dated 07/17/2015. There is progressive involvement of the superior mesenteric artery. Additionally, there is progressive mass effect upon the common bile duct with interval increase in common bile duct and intrahepatic bile duct dilatation. 2. Aortic atherosclerosis. Electronically Signed   By: Kerby Moors M.D.   On: 10/29/2015 15:21    ASSESSMENT & PLAN:  82 year old Caucasian female, presented with intermittent abdominal pain, weight loss, and a CT finding of a pancreatic mass.  1. Low GI bleeding -She previously had no GI bleeding when she was on Coumadin, she is now on Xarelto, had one more episode of small GI bleeding. But possible she has had slow GI bleeding lately, given the slowly trending down H/H  -I recommend her to decrease her Xeloda dose  to 15 mg, skip the dose tonight, and start tomorrow. -I suggest her to have a repeated colonoscopy to look for the source of bleeding. However she declined, she does not tolerate anesthesia well. -I recommend blood transfusion, however she feels well, would like to hold on blood transfusion, and monitor closely -She is agreeable to start oral iron tablet one tablet day, constipation reviewed with her -I'll repeat her CBC and iron studies next week  2. Pancreatic body/head adenocarcinoma, well differentiated, cT2N1M0, stage IIB, unresectable -I previously reviewed her CT chest results, which was negative for metastatic disease. -The biopsy results was reviewed with her and her family members in detail. -Her case was discussed in our GI tumor Board yesterday, Dr. Barry Dienes feels this is not resectable disease. -I reviewed the nature history of pancreatic cancer, which is aggressive. Her disease is incurable at this stage, and the goal of therapy is palliative and prolong her life. -she tolerated first line chemo gemcitabine poorly, with diarrhea, poor appetite, and skin rash, and was switched to second line Xeloda. - I reviewed her restaging CT scan from 6/26, which unfortunately showed disease progression in pancreas, no other metastasis.  -She is going to start palliative radiation soon    2. Left LE DVT -Probably provoked by her underlying malignancy and chemotherapy -I recommend anticoagulation indefinitely, giving her incurable malignancy, if no contraindications such as bleeding. -She has had mild GI bleeding, I recommend her to decrease Xeloda to 15 mg daily   3. Fatigue, anorexia, and abdominal pain -improved some, she'll continue tramadol  and Vicodin as needed.   4.HTN, DM, hypothyroidism  -follow up with PCP   Plan -Hold Xarelto today, and changed to 15 mg from tomorrow, I called in the new prescription today  -Repeat a CBC, iron study and see me back in 1 week   All questions  were answered. The patient knows to call the clinic with any problems, questions or concerns.  I spent 20 minutes counseling the patient face to face. The total time spent in the appointment was 25 minutes and more than 50% was on counseling.    [name], MD 11/13/2015

## 2015-11-13 NOTE — Telephone Encounter (Signed)
Call from patient's daughter Nicole Sherman [phone]) reporting "This morning, mom noticed dark blood in commode and dark blood in stools.  She's on xarelto and was told to monitor for bleeding.  When on coumadin, she had black stools and a small bleed is why coumadin was stopped."

## 2015-11-13 NOTE — Telephone Encounter (Signed)
Verbal order received and read back from Dr. Burr Medico for labs and visit today at 3:00 pm.  Order given to [name] at this time.

## 2015-11-14 ENCOUNTER — Ambulatory Visit
Admission: RE | Admit: 2015-11-14 | Discharge: 2015-11-14 | Disposition: A | Discharge status: Home / Self Care | Payer: Medicare Other | Source: Ambulatory Visit | Attending: Radiation Oncology | Admitting: Radiation Oncology

## 2015-11-14 ENCOUNTER — Encounter: Payer: Self-pay | Admitting: *Deleted

## 2015-11-14 DIAGNOSIS — Z510 Encounter for antineoplastic radiation therapy: Secondary | ICD-10-CM | POA: Diagnosis not present

## 2015-11-14 DIAGNOSIS — C251 Malignant neoplasm of body of pancreas: Secondary | ICD-10-CM

## 2015-11-16 ENCOUNTER — Ambulatory Visit (HOSPITAL_COMMUNITY)
Admission: RE | Admit: 2015-11-16 | Discharge: 2015-11-16 | Disposition: A | Discharge status: Home / Self Care | Payer: Medicare Other | Source: Ambulatory Visit | Attending: Hematology | Admitting: Hematology

## 2015-11-16 ENCOUNTER — Ambulatory Visit (HOSPITAL_COMMUNITY)
Admission: RE | Admit: 2015-11-16 | Discharge: 2015-11-16 | Disposition: A | Discharge status: Home / Self Care | Payer: Medicare Other | Source: Ambulatory Visit | Attending: Family Medicine | Admitting: Family Medicine

## 2015-11-16 ENCOUNTER — Ambulatory Visit (HOSPITAL_BASED_OUTPATIENT_CLINIC_OR_DEPARTMENT_OTHER): Payer: Medicare Other | Admitting: Nurse Practitioner

## 2015-11-16 ENCOUNTER — Telehealth: Payer: Self-pay | Admitting: *Deleted

## 2015-11-16 ENCOUNTER — Other Ambulatory Visit: Payer: Self-pay | Admitting: Nurse Practitioner

## 2015-11-16 ENCOUNTER — Telehealth: Payer: Self-pay | Admitting: Nurse Practitioner

## 2015-11-16 ENCOUNTER — Ambulatory Visit (HOSPITAL_BASED_OUTPATIENT_CLINIC_OR_DEPARTMENT_OTHER): Payer: Medicare Other

## 2015-11-16 ENCOUNTER — Other Ambulatory Visit: Payer: Self-pay | Admitting: *Deleted

## 2015-11-16 VITALS — BP 120/72 | HR 70 | Temp 98.5°F | Resp 16

## 2015-11-16 VITALS — BP 127/74 | HR 97 | Temp 98.2°F | Resp 18 | Ht 66.5 in | Wt 137.7 lb

## 2015-11-16 DIAGNOSIS — E8809 Other disorders of plasma-protein metabolism, not elsewhere classified: Secondary | ICD-10-CM

## 2015-11-16 DIAGNOSIS — D630 Anemia in neoplastic disease: Secondary | ICD-10-CM | POA: Diagnosis present

## 2015-11-16 DIAGNOSIS — C251 Malignant neoplasm of body of pancreas: Secondary | ICD-10-CM

## 2015-11-16 DIAGNOSIS — C259 Malignant neoplasm of pancreas, unspecified: Secondary | ICD-10-CM

## 2015-11-16 DIAGNOSIS — E46 Unspecified protein-calorie malnutrition: Secondary | ICD-10-CM

## 2015-11-16 DIAGNOSIS — Z7901 Long term (current) use of anticoagulants: Secondary | ICD-10-CM | POA: Diagnosis not present

## 2015-11-16 DIAGNOSIS — K922 Gastrointestinal hemorrhage, unspecified: Secondary | ICD-10-CM

## 2015-11-16 DIAGNOSIS — R110 Nausea: Secondary | ICD-10-CM

## 2015-11-16 DIAGNOSIS — R1111 Vomiting without nausea: Secondary | ICD-10-CM

## 2015-11-16 DIAGNOSIS — K625 Hemorrhage of anus and rectum: Secondary | ICD-10-CM

## 2015-11-16 LAB — CBC WITH DIFFERENTIAL/PLATELET
BASO%: 1.3 % (ref 0.0–2.0)
BASOS ABS: 0.1 10*3/uL (ref 0.0–0.1)
EOS%: 3.6 % (ref 0.0–7.0)
Eosinophils Absolute: 0.2 10*3/uL (ref 0.0–0.5)
HCT: 24.9 % — ABNORMAL LOW (ref 34.8–46.6)
HEMOGLOBIN: 8 g/dL — AB (ref 11.6–15.9)
LYMPH%: 26.5 % (ref 14.0–49.7)
MCH: 31 pg (ref 25.1–34.0)
MCHC: 32.3 g/dL (ref 31.5–36.0)
MCV: 96 fL (ref 79.5–101.0)
MONO#: 0.5 10*3/uL (ref 0.1–0.9)
MONO%: 10.8 % (ref 0.0–14.0)
NEUT%: 57.8 % (ref 38.4–76.8)
NEUTROS ABS: 2.8 10*3/uL (ref 1.5–6.5)
Platelets: 241 10*3/uL (ref 145–400)
RBC: 2.6 10*6/uL — ABNORMAL LOW (ref 3.70–5.45)
RDW: 27.7 % — AB (ref 11.2–14.5)
WBC: 4.8 10*3/uL (ref 3.9–10.3)
lymph#: 1.3 10*3/uL (ref 0.9–3.3)

## 2015-11-16 LAB — COMPREHENSIVE METABOLIC PANEL
ALBUMIN: 2.4 g/dL — AB (ref 3.5–5.0)
ALK PHOS: 106 U/L (ref 40–150)
ALT: <9 U/L (ref 0–55)
AST: 12 U/L (ref 5–34)
Anion Gap: 10 mEq/L (ref 3–11)
BUN: 21.7 mg/dL (ref 7.0–26.0)
CO2: 22 meq/L (ref 22–29)
Calcium: 8.3 mg/dL — ABNORMAL LOW (ref 8.4–10.4)
Chloride: 108 mEq/L (ref 98–109)
Creatinine: 0.9 mg/dL (ref 0.6–1.1)
EGFR: 61 mL/min/{1.73_m2} — ABNORMAL LOW (ref >90)
GLUCOSE: 154 mg/dL — AB (ref 70–140)
POTASSIUM: 3.7 meq/L (ref 3.5–5.1)
SODIUM: 141 meq/L (ref 136–145)
Total Bilirubin: 0.39 mg/dL (ref 0.20–1.20)
Total Protein: 5.6 g/dL — ABNORMAL LOW (ref 6.4–8.3)

## 2015-11-16 LAB — PREPARE RBC (CROSSMATCH)

## 2015-11-16 MED ORDER — ACETAMINOPHEN 325 MG PO TABS
650.0000 mg | ORAL_TABLET | Freq: Once | ORAL | Status: AC
Start: 2015-11-16 — End: 2015-11-16
  Administered 2015-11-16: 650 mg via ORAL
  Filled 2015-11-16: qty 2

## 2015-11-16 MED ORDER — SODIUM CHLORIDE 0.9% FLUSH: 3.0000 mL | INTRAVENOUS | Status: DC | PRN

## 2015-11-16 MED ORDER — SODIUM CHLORIDE 0.9 % IV SOLN
250.0000 mL | Freq: Once | INTRAVENOUS | Status: AC
  Administered 2015-11-16: 250 mL via INTRAVENOUS

## 2015-11-16 MED ORDER — SODIUM CHLORIDE 0.9% FLUSH: 10.0000 mL | INTRAVENOUS | Status: DC | PRN

## 2015-11-16 MED ORDER — DIPHENHYDRAMINE HCL 25 MG PO CAPS
25.0000 mg | ORAL_CAPSULE | Freq: Once | ORAL | Status: AC
  Administered 2015-11-16: 25 mg via ORAL
  Filled 2015-11-16: qty 1

## 2015-11-16 MED ORDER — HYDROCODONE-ACETAMINOPHEN 5-325 MG PO TABS: 1.0000 | ORAL_TABLET | ORAL | Status: DC | PRN

## 2015-11-16 MED FILL — HYDROCODON-APAP 5-325: 5-325 | 5 days supply | Qty: 60 | Fill #0

## 2015-11-16 NOTE — Telephone Encounter (Signed)
Received call from husband Jeneen Rinks requesting refill of Vicodin for pt .  Jeneen Rinks stated pt will run out of pain meds over weekend.  Message relayed to Retta Mac, NP. Jeneen Rinks'    Phone    [phone].

## 2015-11-16 NOTE — Telephone Encounter (Signed)
lvm to inform pt of 7/17 appt per pof

## 2015-11-16 NOTE — Progress Notes (Signed)
Patient ID: Nicole Sherman, female   DOB: 1934-04-23, 82 y.o.   MRN: [id number] Medical Provider: Drue Second NP  Diagnosis Association: Symptomatic Anemia          Procedure: Transfusion of 2 units PRBC as ordered via PIV.   Tolerated transfusion well. No signs of adverse reaction. Went over discharge instructions and copy given to patient with understanding. Alert, oriented and ambulatory at time of discharge.

## 2015-11-16 NOTE — Discharge Instructions (Signed)
Blood Transfusion, Care After These instructions give you information about caring for yourself after your procedure. Your doctor may also give you more specific instructions. Call your doctor if you have any problems or questions after your procedure.  HOME CARE  Take medicines only as told by your doctor. Ask your doctor if you can take an over-the-counter pain reliever if you have a fever or headache a day or two after your procedure.  Return to your normal activities as told by your doctor. GET HELP IF:   You develop redness or irritation at your IV site.  You have a fever, chills, or a headache that does not go away.  Your pee (urine) is darker than normal.  Your urine turns:  Pink.  Red.  [place].  The white part of your eye turns yellow (jaundice).  You feel weak after doing your normal activities. GET HELP RIGHT AWAY IF:   You have trouble breathing.  You have fever and chills and you also have:  Anxiety.  Chest or back pain.  Flushed or pink skin.  Clammy or sweaty skin.  A fast heartbeat.  A sick feeling in your stomach (nausea).   This information is not intended to replace advice given to you by your health care provider. Make sure you discuss any questions you have with your health care provider.   Document Released: 05/12/2014 Document Reviewed: 05/12/2014 Elsevier Interactive Patient Education 2016 [place].  Blood Transfusion, Care After Refer to this sheet in the next few weeks. These instructions provide you with information about caring for yourself after your procedure. Your health care provider may also give you more specific instructions. Your treatment has been planned according to current medical practices, but problems sometimes occur. Call your health care provider if you have any problems or questions after your procedure. WHAT TO EXPECT AFTER THE PROCEDURE After your procedure, it is common to have:  Bruising and soreness at the IV  site.  Chills or fever.  Headache. HOME CARE INSTRUCTIONS  Take medicines only as directed by your health care provider. Ask your health care provider if you can take an over-the-counter pain reliever in case you have a fever or headache a day or two after your transfusion.  Return to your normal activities as directed by your health care provider. SEEK MEDICAL CARE IF:   You develop redness or irritation at your IV site.  You have persistent fever, chills, or headache.  Your urine is darker than normal.  Your urine turns pink, red, or brown.   The white part of your eye turns yellow (jaundice).   You feel weak after doing your normal activities.  SEEK IMMEDIATE MEDICAL CARE IF:   You have trouble breathing.  You have fever and chills along with:  Anxiety.  Chest or back pain.  Flushed skin.  Clammy skin.  A rapid heartbeat.  Nausea.   This information is not intended to replace advice given to you by your health care provider. Make sure you discuss any questions you have with your health care provider.   Document Released: 05/12/2014 Document Reviewed: 05/12/2014 Elsevier Interactive Patient Education [employer].

## 2015-11-16 NOTE — Telephone Encounter (Signed)
Spoke with daughter and was informed that Nicole Sherman is feeling weak - could hardly get out of bed.  Clarified with daughter , Nicole Sherman could get out of bed but not doing much, just sits around.  Denied shortness of breath, eating very little.  Nicole Sherman just not doing much.  Daughter stated Dr. Burr Medico had mentioned about Nicole Sherman needing blood transfusion at last office visit on Tues 7/11.   Daughter would like to know what Nicole Sherman needs to do to receive blood today.  Instructed her to bring Nicole Sherman in NOW for lab rechecked and to see Jenny Reichmann, NP symptom management.   [place] and was able to obtain an appt for blood transfusion today for Nicole Sherman if needed.   Jenny Reichmann, NP and Joellen Jersey, desk nurse notified of above info.  POF sent to scheduler.

## 2015-11-18 ENCOUNTER — Other Ambulatory Visit: Payer: Self-pay | Admitting: Nurse Practitioner

## 2015-11-18 ENCOUNTER — Encounter: Payer: Self-pay | Admitting: Nurse Practitioner

## 2015-11-18 DIAGNOSIS — R110 Nausea: Secondary | ICD-10-CM | POA: Insufficient documentation

## 2015-11-18 DIAGNOSIS — K922 Gastrointestinal hemorrhage, unspecified: Secondary | ICD-10-CM

## 2015-11-18 DIAGNOSIS — E46 Unspecified protein-calorie malnutrition: Secondary | ICD-10-CM | POA: Insufficient documentation

## 2015-11-18 DIAGNOSIS — C251 Malignant neoplasm of body of pancreas: Secondary | ICD-10-CM

## 2015-11-18 DIAGNOSIS — E8809 Other disorders of plasma-protein metabolism, not elsewhere classified: Secondary | ICD-10-CM | POA: Insufficient documentation

## 2015-11-18 NOTE — Assessment & Plan Note (Signed)
Patient's most recent restaging scan revealed progression of disease; and patient was instructed to stop all oral Xeloda therapy.  She is scheduled to initiate palliative radiation treatments on Wednesday, 11/21/2015.  Patient will return to the [facility] on Monday, 11/19/2015 for labs and symptom management clinic appointment.  Will need to review with Dr. Burr Medico in regards to next follow up.

## 2015-11-18 NOTE — Assessment & Plan Note (Signed)
Patient complains of chronic nausea but no vomiting.  She states she does have the anti--nausea medications already at home to take on an as-needed basis.

## 2015-11-18 NOTE — Progress Notes (Signed)
SYMPTOM MANAGEMENT CLINIC    Chief Complaint: GI bleeding, anemia  HPI:  Nicole Sherman 82 y.o. female diagnosed with unresectable pancreatic cancer.  Patient is status post Xeloda oral therapy.  Scheduled to begin radiation treatments next week.   Patient has decreased her Xarelto from 20 mg per day down to 15 mg per day secondary to chronic bloody stool.  She states that the bloody stool began again this past Tuesday, 11/16/2015.  She states it is now increased once again; and that the stool is very dark and the obvious blood is dark as well.  Exam today reveals patient pale, fatigued, and very weak.  Abdomen is soft and nontender to palpation.  Reviewed all findings with Dr. Burr Medico; and she finds that patient hold her Xarelto altogether and to return to the [facility] for labs and follow-up visit in a symptom management clinic on Monday, 11/19/2015.  Also, for patient's anemia-she will receive 2 units packed red blood cell transfusion in the sickle cell unit today.  Long discussion with patient and her family regarding the need to consider a colonoscopy for further evaluation.  Patient states that she has heard.  Many bad stories regarding colonoscopies and anesthesia in the past; and this scared to undergo a colonoscopy.  Advised patient and her family would need to reconsider a colonoscopy on Monday if discontinuation of the Xarelto does not stop the GI bleeding.  Also, patient was advised to contact emergency department over the weekend if any worsening symptoms whatsoever.  Oncology History   Pancreatic cancer [facility])   Staging form: Pancreas, AJCC 7th Edition     Clinical stage from 05/27/2014: Stage IIB (T2, N1, M0) - Signed by [name], MD on 05/31/2015       Carcinoma of body of pancreas ([place])   05/16/2015 Imaging CT chest, abdomen and pelvis with contrast showed a bulky mass at pancreatic neck/body, tumor encases the celiac and proximal branches and a cruise the main portal  vein, peripancreatic adenopathy. no distant mets.    05/28/2015 Initial Biopsy Pancreatic mass needle biopsy showed well differentiated adenocarcinoma   05/31/2015 Initial Diagnosis Pancreatic cancer ([place])   05/31/2015 Tumor Marker CA19.9 =375   06/05/2015 - 07/03/2015 Chemotherapy weekly gemcitabine 1034m/m2, dose reduced to 854mm2 on week 2 due to tolerance issue   08/27/2015 -  Chemotherapy xeloda 100043mid, 14 days on, 7 days off    Review of Systems  Constitutional: Positive for malaise/fatigue.  Gastrointestinal: Positive for blood in stool.  Neurological: Positive for weakness.  All other systems reviewed and are negative.   Past Medical History  Diagnosis Date  . Hypertension   . Hypothyroidism   . Diabetes mellitus without complication ([place] . Arthritis     osteoarthritis-hands wrist  . Diverticulosis     showing on CT of abdomen 05-16-15  . [facility]   pancreatic mass- dx. adenocarcinoma" CT abdomen 05-16-15    Past Surgical History  Procedure Laterality Date  . Joint replacement Right   . Shoulder arthroscopy w/ rotator cuff repair Left   . Shoulder open rotator cuff repair Right   . Tubal ligation    . Appendectomy      open  . Ankle surgery Right   . Eye surgery      macular hole surgery repair  . Cataract extraction Right   . Eus N/A 05/28/2015    Procedure: UPPER ENDOSCOPIC ULTRASOUND (EUS) LINEAR;  Surgeon: WilArta SilenceD;  Location:  [facility];  Service: Endoscopy;  Laterality: N/A;    has Carcinoma of body of pancreas ([place]); Long term current use of anticoagulant therapy; Acute deep vein thrombosis (DVT) of distal vein of left lower extremity ([place]); Hypothyroidism; Hypertension; Diabetes mellitus without complication ([place]); Anemia in neoplastic disease; Lower GI bleeding; Nausea without vomiting; and Hypoalbuminemia due to protein-calorie malnutrition ([place]) on her problem list.    is allergic to keflex and aspirin.    Medication List         This list is accurate as of: 11/16/15 11:59 PM.  Always use your most recent med list.               docusate sodium 100 MG capsule  Commonly known as:  COLACE  Take 100 mg by mouth 3 (three) times daily.     HYDROcodone-acetaminophen 5-325 MG tablet  Commonly known as:  NORCO/VICODIN  Take 1-2 tablets by mouth every 4 (four) hours as needed for moderate pain.     levothyroxine 100 MCG tablet  Commonly known as:  SYNTHROID, LEVOTHROID  Take 100 mcg by mouth daily before breakfast.     metFORMIN 1000 MG tablet  Commonly known as:  GLUCOPHAGE  Take 1,000 mg by mouth 2 (two) times daily with a meal.     omeprazole 20 MG tablet  Commonly known as:  PRILOSEC OTC  Take 20 mg by mouth daily as needed (acid reflux). Reported on 11/12/2015     ondansetron 8 MG tablet  Commonly known as:  ZOFRAN  Take 1 tablet (8 mg total) by mouth 2 (two) times daily as needed (Nausea or vomiting).     Rivaroxaban 15 MG Tabs tablet  Commonly known as:  XARELTO  Take 1 tablet (15 mg total) by mouth 2 (two) times daily with a meal.         PHYSICAL EXAMINATION  Oncology Vitals 11/16/2015 11/16/2015  Height - -  Weight - -  Weight (lbs) - -  BMI (kg/m2) - -  Temp 98.5 98.4  Pulse 70 75  Resp 16 16  SpO2 97 98  BSA (m2) - -   BP Readings from Last 2 Encounters:  11/16/15 120/72  11/16/15 127/74    Physical Exam  Constitutional: She is oriented to person, place, and time. Vital signs are normal. She appears unhealthy.  HENT:  Head: Normocephalic and atraumatic.  Mouth/Throat: Oropharynx is clear and moist.  Eyes: Conjunctivae and EOM are normal. Pupils are equal, round, and reactive to light. Right eye exhibits no discharge. Left eye exhibits no discharge. No scleral icterus.  Neck: Normal range of motion.  Pulmonary/Chest: Effort normal. No respiratory distress.  Abdominal: Soft. She exhibits no distension and no mass. There is no tenderness. There is no rebound and no guarding.   Musculoskeletal: Normal range of motion. She exhibits no edema or tenderness.  Neurological: She is alert and oriented to person, place, and time. Gait normal.  Skin: Skin is warm and dry. No rash noted. No erythema. There is pallor.  Psychiatric: Affect normal.  Nursing note and vitals reviewed.   LABORATORY DATA:. Hospital Outpatient Visit on 11/16/2015  Component Date Value Ref Range Status  . Order Confirmation 11/16/2015 ORDER PROCESSED BY BLOOD BANK   Final  Appointment on 11/16/2015  Component Date Value Ref Range Status  . WBC 11/16/2015 4.8  3.9 - 10.3 10e3/uL Final  . NEUT# 11/16/2015 2.8  1.5 - 6.5 10e3/uL Final  . HGB 11/16/2015 8.0* 11.6 - 15.9  g/dL Final  . HCT 11/16/2015 24.9* 34.8 - 46.6 % Final  . Platelets 11/16/2015 241  145 - 400 10e3/uL Final  . MCV 11/16/2015 96.0  79.5 - 101.0 fL Final  . MCH 11/16/2015 31.0  25.1 - 34.0 pg Final  . MCHC 11/16/2015 32.3  31.5 - 36.0 g/dL Final  . RBC 11/16/2015 2.60* 3.70 - 5.45 10e6/uL Final  . RDW 11/16/2015 27.7* 11.2 - 14.5 % Final  . lymph# 11/16/2015 1.3  0.9 - 3.3 10e3/uL Final  . MONO# 11/16/2015 0.5  0.1 - 0.9 10e3/uL Final  . Eosinophils Absolute 11/16/2015 0.2  0.0 - 0.5 10e3/uL Final  . Basophils Absolute 11/16/2015 0.1  0.0 - 0.1 10e3/uL Final  . NEUT% 11/16/2015 57.8  38.4 - 76.8 % Final  . LYMPH% 11/16/2015 26.5  14.0 - 49.7 % Final  . MONO% 11/16/2015 10.8  0.0 - 14.0 % Final  . EOS% 11/16/2015 3.6  0.0 - 7.0 % Final  . BASO% 11/16/2015 1.3  0.0 - 2.0 % Final  . Sodium 11/16/2015 141  136 - 145 mEq/L Final  . Potassium 11/16/2015 3.7  3.5 - 5.1 mEq/L Final  . Chloride 11/16/2015 108  98 - 109 mEq/L Final  . CO2 11/16/2015 22  22 - 29 mEq/L Final  . Glucose 11/16/2015 154* 70 - 140 mg/dl Final   Glucose reference range is for nonfasting patients. Fasting glucose reference range is 70- 100.  Marland Kitchen BUN 11/16/2015 21.7  7.0 - 26.0 mg/dL Final  . Creatinine 11/16/2015 0.9  0.6 - 1.1 mg/dL Final  . Total  Bilirubin 11/16/2015 0.39  0.20 - 1.20 mg/dL Final  . Alkaline Phosphatase 11/16/2015 106  40 - 150 U/L Final  . AST 11/16/2015 12  5 - 34 U/L Final  . ALT 11/16/2015 <9  0 - 55 U/L Final  . Total Protein 11/16/2015 5.6* 6.4 - 8.3 g/dL Final  . Albumin 11/16/2015 2.4* 3.5 - 5.0 g/dL Final  . Calcium 11/16/2015 8.3* 8.4 - 10.4 mg/dL Final  . Anion Gap 11/16/2015 10  3 - 11 mEq/L Final  . EGFR 11/16/2015 61* >90 ml/min/1.73 m2 Final   eGFR is calculated using the CKD-EPI Creatinine Equation (2009)  Hospital Outpatient Visit on 11/16/2015  Component Date Value Ref Range Status  . ABO/RH(D) 11/16/2015 O POS   Final  . Antibody Screen 11/16/2015 NEG   Final  . Sample Expiration 11/16/2015 11/19/2015   Final  . Unit Number 11/16/2015 [id number]   Final  . Blood Component Type 11/16/2015 RBC LR [id number]   Final  . Unit division 11/16/2015 00   Final  . Status of Unit 11/16/2015 ISSUED   Final  . Transfusion Status 11/16/2015 OK TO TRANSFUSE   Final  . Crossmatch Result 11/16/2015 Compatible   Final  . Unit Number 11/16/2015 [id number]   Final  . Blood Component Type 11/16/2015 RBC LR PHER1   Final  . Unit division 11/16/2015 00   Final  . Status of Unit 11/16/2015 ISSUED   Final  . Transfusion Status 11/16/2015 OK TO TRANSFUSE   Final  . Crossmatch Result 11/16/2015 Compatible   Final    RADIOGRAPHIC STUDIES: No results found.  ASSESSMENT/PLAN:    Nausea without vomiting Patient complains of chronic nausea but no vomiting.  She states she does have the anti--nausea medications already at home to take on an as-needed basis.  Lower GI bleeding Patient has decreased her Xarelto from 20 mg per day down to 15 mg per  day secondary to chronic bloody stool.  She states that the bloody stool began again this past Tuesday, 11/16/2015.  She states it is now increased once again; and that the stool is very dark and the obvious blood is dark as well.  Exam today reveals patient pale, fatigued,  and very weak.  Abdomen is soft and nontender to palpation.  Reviewed all findings with Dr. Burr Medico; and she finds that patient hold her Xarelto altogether and to return to the [facility] for labs and follow-up visit in a symptom management clinic on Monday, 11/19/2015.  Also, for patient's anemia-she will receive 2 units packed red blood cell transfusion in the sickle cell unit today.  Long discussion with patient and her family regarding the need to consider a colonoscopy for further evaluation.  Patient states that she has heard.  Many bad stories regarding colonoscopies and anesthesia in the past; and this scared to undergo a colonoscopy.  Advised patient and her family would need to reconsider a colonoscopy on Monday if discontinuation of the Xarelto does not stop the GI bleeding.  Also, patient was advised to contact emergency department over the weekend if any worsening symptoms whatsoever.  Long term current use of anticoagulant therapy Patient has a history of DVT; and has recently been switched from Coumadin to Xarelto due to chronic GI bleeding.  When the GI bleeding continued.  Patient's Xarelto) decreased from 20 mg a day down to 15 mg a day.  Patient has decreased her Xarelto from 20 mg per day down to 15 mg per day secondary to chronic bloody stool.  She states that the bloody stool began again this past Tuesday, 11/16/2015.  She states it is now increased once again; and that the stool is very dark and the obvious blood is dark as well.  Exam today reveals patient pale, fatigued, and very weak.  Abdomen is soft and nontender to palpation.  Reviewed all findings with Dr. Burr Medico; and she finds that patient hold her Xarelto altogether and to return to the [facility] for labs and follow-up visit in a symptom management clinic on Monday, 11/19/2015.  Also, for patient's anemia-she will receive 2 units packed red blood cell transfusion in the sickle cell unit today.  Long discussion  with patient and her family regarding the need to consider a colonoscopy for further evaluation.  Patient states that she has heard.  Many bad stories regarding colonoscopies and anesthesia in the past; and this scared to undergo a colonoscopy.  Advised patient and her family would need to reconsider a colonoscopy on Monday if discontinuation of the Xarelto does not stop the GI bleeding.  Also, patient was advised to contact emergency department over the weekend if any worsening symptoms whatsoever.  Hypoalbuminemia due to protein-calorie malnutrition (HCC) Albumin is decreased down to 2.4 today.  Patient was encouraged to push protein in her diet.  Carcinoma of body of pancreas ([place]) Patient's most recent restaging scan revealed progression of disease; and patient was instructed to stop all oral Xeloda therapy.  She is scheduled to initiate palliative radiation treatments on Wednesday, 11/21/2015.  Patient will return to the [facility] on Monday, 11/19/2015 for labs and symptom management clinic appointment.  Will need to review with Dr. Burr Medico in regards to next follow up.    Patient stated understanding of all instructions; and was in agreement with this plan of care. The patient knows to call the clinic with any problems, questions or concerns.   Total time  spent with patient was 40 minutes;  with greater than 75 percent of that time spent in face to face counseling regarding patient's symptoms,  and coordination of care and follow up.  Disclaimer:This dictation was prepared with Dragon/digital dictation along with [employer]. Any transcriptional errors that result from this process are unintentional.  Drue Second, NP 11/18/2015

## 2015-11-18 NOTE — Assessment & Plan Note (Signed)
Albumin is decreased down to 2.4 today.  Patient was encouraged to push protein in her diet.

## 2015-11-18 NOTE — Assessment & Plan Note (Signed)
Patient has a history of DVT; and has recently been switched from Coumadin to Xarelto due to chronic GI bleeding.  When the GI bleeding continued.  Patient's Xarelto) decreased from 20 mg a day down to 15 mg a day.  Patient has decreased her Xarelto from 20 mg per day down to 15 mg per day secondary to chronic bloody stool.  She states that the bloody stool began again this past Tuesday, 11/16/2015.  She states it is now increased once again; and that the stool is very dark and the obvious blood is dark as well.  Exam today reveals patient pale, fatigued, and very weak.  Abdomen is soft and nontender to palpation.  Reviewed all findings with Dr. Burr Medico; and she finds that patient hold her Xarelto altogether and to return to the [facility] for labs and follow-up visit in a symptom management clinic on Monday, 11/19/2015.  Also, for patient's anemia-she will receive 2 units packed red blood cell transfusion in the sickle cell unit today.  Long discussion with patient and her family regarding the need to consider a colonoscopy for further evaluation.  Patient states that she has heard.  Many bad stories regarding colonoscopies and anesthesia in the past; and this scared to undergo a colonoscopy.  Advised patient and her family would need to reconsider a colonoscopy on Monday if discontinuation of the Xarelto does not stop the GI bleeding.  Also, patient was advised to contact emergency department over the weekend if any worsening symptoms whatsoever.

## 2015-11-18 NOTE — Assessment & Plan Note (Signed)
Patient has decreased her Xarelto from 20 mg per day down to 15 mg per day secondary to chronic bloody stool.  She states that the bloody stool began again this past Tuesday, 11/16/2015.  She states it is now increased once again; and that the stool is very dark and the obvious blood is dark as well.  Exam today reveals patient pale, fatigued, and very weak.  Abdomen is soft and nontender to palpation.  Reviewed all findings with Dr. Burr Medico; and she finds that patient hold her Xarelto altogether and to return to the [facility] for labs and follow-up visit in a symptom management clinic on Monday, 11/19/2015.  Also, for patient's anemia-she will receive 2 units packed red blood cell transfusion in the sickle cell unit today.  Long discussion with patient and her family regarding the need to consider a colonoscopy for further evaluation.  Patient states that she has heard.  Many bad stories regarding colonoscopies and anesthesia in the past; and this scared to undergo a colonoscopy.  Advised patient and her family would need to reconsider a colonoscopy on Monday if discontinuation of the Xarelto does not stop the GI bleeding.  Also, patient was advised to contact emergency department over the weekend if any worsening symptoms whatsoever.

## 2015-11-19 ENCOUNTER — Encounter: Payer: Self-pay | Admitting: Nurse Practitioner

## 2015-11-19 ENCOUNTER — Ambulatory Visit (HOSPITAL_BASED_OUTPATIENT_CLINIC_OR_DEPARTMENT_OTHER): Payer: Medicare Other | Admitting: Nurse Practitioner

## 2015-11-19 ENCOUNTER — Other Ambulatory Visit (HOSPITAL_BASED_OUTPATIENT_CLINIC_OR_DEPARTMENT_OTHER): Payer: Medicare Other

## 2015-11-19 VITALS — BP 152/86 | HR 87 | Temp 98.0°F | Resp 18 | Ht 66.5 in | Wt 140.7 lb

## 2015-11-19 DIAGNOSIS — R110 Nausea: Secondary | ICD-10-CM

## 2015-11-19 DIAGNOSIS — Z7901 Long term (current) use of anticoagulants: Secondary | ICD-10-CM

## 2015-11-19 DIAGNOSIS — K922 Gastrointestinal hemorrhage, unspecified: Secondary | ICD-10-CM

## 2015-11-19 DIAGNOSIS — C251 Malignant neoplasm of body of pancreas: Secondary | ICD-10-CM

## 2015-11-19 LAB — TYPE AND SCREEN
ABO/RH(D): O POS
Antibody Screen: NEGATIVE
UNIT DIVISION: 0
Unit division: 0

## 2015-11-19 LAB — CBC WITH DIFFERENTIAL/PLATELET
BASO%: 1.3 % (ref 0.0–2.0)
BASOS ABS: 0.1 10*3/uL (ref 0.0–0.1)
EOS ABS: 0.1 10*3/uL (ref 0.0–0.5)
EOS%: 2.9 % (ref 0.0–7.0)
HCT: 32.9 % — ABNORMAL LOW (ref 34.8–46.6)
HGB: 10.7 g/dL — ABNORMAL LOW (ref 11.6–15.9)
LYMPH%: 23.3 % (ref 14.0–49.7)
MCH: 30.6 pg (ref 25.1–34.0)
MCHC: 32.5 g/dL (ref 31.5–36.0)
MCV: 94.3 fL (ref 79.5–101.0)
MONO#: 0.5 10*3/uL (ref 0.1–0.9)
MONO%: 10.1 % (ref 0.0–14.0)
NEUT#: 3 10*3/uL (ref 1.5–6.5)
NEUT%: 62.4 % (ref 38.4–76.8)
PLATELETS: 217 10*3/uL (ref 145–400)
RBC: 3.49 10*6/uL — AB (ref 3.70–5.45)
RDW: 24.2 % — ABNORMAL HIGH (ref 11.2–14.5)
WBC: 4.9 10*3/uL (ref 3.9–10.3)
lymph#: 1.1 10*3/uL (ref 0.9–3.3)

## 2015-11-19 LAB — COMPREHENSIVE METABOLIC PANEL
ALBUMIN: 2.6 g/dL — AB (ref 3.5–5.0)
ALT: <9 U/L (ref 0–55)
ANION GAP: 9 meq/L (ref 3–11)
AST: 11 U/L (ref 5–34)
Alkaline Phosphatase: 99 U/L (ref 40–150)
BILIRUBIN TOTAL: 0.37 mg/dL (ref 0.20–1.20)
BUN: 16.7 mg/dL (ref 7.0–26.0)
CHLORIDE: 108 meq/L (ref 98–109)
CO2: 23 meq/L (ref 22–29)
CREATININE: 0.9 mg/dL (ref 0.6–1.1)
Calcium: 8.5 mg/dL (ref 8.4–10.4)
EGFR: 60 mL/min/{1.73_m2} — AB (ref >90)
Glucose: 180 mg/dl — ABNORMAL HIGH (ref 70–140)
Potassium: 3.6 mEq/L (ref 3.5–5.1)
SODIUM: 140 meq/L (ref 136–145)
TOTAL PROTEIN: 6 g/dL — AB (ref 6.4–8.3)

## 2015-11-19 LAB — IRON AND TIBC
%SAT: 14 % — AB (ref 21–57)
Iron: 38 ug/dL — ABNORMAL LOW (ref 41–142)
TIBC: 264 ug/dL (ref 236–444)
UIBC: 226 ug/dL (ref 120–384)

## 2015-11-19 LAB — FERRITIN: FERRITIN: 28 ng/mL (ref 9–269)

## 2015-11-19 NOTE — Assessment & Plan Note (Signed)
Patient was instructed to hold her Xarelto when she was at the [facility] this past Friday, 11/16/2015.  She states that she experienced one episode of bright red bloody stools, early Saturday morning; but has since had no further GI bleeding whatsoever.  She denies any abdominal discomfort as well.  Patient received 2 units packed red blood cells transfusion this past Friday, 11/16/2015; and hemoglobin was stable today at 10.7.  Reviewed all findings with Dr.Feng; and patient was given the options of restarting the Xarelto at a lower dose, Lovenox injections, or placement of a [place] filter.  Patient states that she has no interest in resuming the Xarelto or the Coumadin.  She also refuses to consider Lovenox injections as well.  She has no interest in the tenia a Greenfield filter as well.  She states that she clearly understands that she is at high risk for recurrent blood clot since she has had blood clots in the past.  She was also advised that pancreatic cancer.  Has a high incidence of blood clots as well.  Patient was adamant that she hold any further anticoagulation.  Patient was then advised to try taking a baby aspirin 80 mg on a daily basis instead.  Patient was comfortable with this plan.  Patient was advised to call/return or go directly to the emergency department for any worsening symptoms whatsoever.

## 2015-11-19 NOTE — Assessment & Plan Note (Signed)
Patient was instructed to hold her Xarelto when she was at the [facility] this past Friday, 11/16/2015.  She states that she experienced one episode of bright red bloody stools, early Saturday morning; but has since had no further GI bleeding whatsoever.  She denies any abdominal discomfort as well.  Patient received 2 units packed red blood cells transfusion this past Friday, 11/16/2015; and hemoglobin was stable today at 10.7.  Reviewed all findings with Dr.Feng; and patient was given the options of restarting the Xarelto at a lower dose, Lovenox injections, or placement of a [place] filter.  Patient states that she has no interest in resuming the Xarelto or the Coumadin.  She also refuses to consider Lovenox injections as well.  She has no interest in the tenia a Greenfield filter as well.  She states that she clearly understands that she is at high risk for recurrent blood clot since she has had blood clots in the past.  She was also advised that pancreatic cancer.  Has a high incidence of blood clots as well.  Patient was adamant that she hold any further anticoagulation.  Patient was then advised to try taking a baby aspirin 80 mg on a daily basis instead.  Patient was comfortable with this plan.  Patient was advised to call/return or go directly to the emergency department for any worsening symptoms whatsoever.

## 2015-11-19 NOTE — Progress Notes (Signed)
SYMPTOM MANAGEMENT CLINIC    Chief Complaint: GI bleeding  HPI:  Nicole Sherman 82 y.o. female diagnosed with pancreatic cancer.  Patient is status post Xeloda oral therapy.  Is scheduled to initiate palliative radiation treatments this week.   Patient was instructed to hold her Xarelto when she was at the [facility] this past Friday, 11/16/2015.  She states that she experienced one episode of bright red bloody stools, early Saturday morning; but has since had no further GI bleeding whatsoever.  She denies any abdominal discomfort as well.  Patient received 2 units packed red blood cells transfusion this past Friday, 11/16/2015; and hemoglobin was stable today at 10.7.  Reviewed all findings with Dr.Feng; and patient was given the options of restarting the Xarelto at a lower dose, Lovenox injections, or placement of a Greenfield filter.  Patient states that she has no interest in resuming the Xarelto or the Coumadin.  She also refuses to consider Lovenox injections as well.  She has no interest in the tenia a Greenfield filter as well.  She states that she clearly understands that she is at high risk for recurrent blood clot since she has had blood clots in the past.  She was also advised that pancreatic cancer.  Has a high incidence of blood clots as well.  Patient was adamant that she hold any further anticoagulation.  Patient was then advised to try taking a baby aspirin 80 mg on a daily basis instead.  Patient was comfortable with this plan.  Patient was advised to call/return or go directly to the emergency department for any worsening symptoms whatsoever.  Oncology History   Pancreatic cancer [facility])   Staging form: Pancreas, AJCC 7th Edition     Clinical stage from 05/27/2014: Stage IIB (T2, N1, M0) - Signed by [name], MD on 05/31/2015       Carcinoma of body of pancreas ([place])   05/16/2015 Imaging CT chest, abdomen and pelvis with contrast showed a bulky mass at pancreatic  neck/body, tumor encases the celiac and proximal branches and a cruise the main portal vein, peripancreatic adenopathy. no distant mets.    05/28/2015 Initial Biopsy Pancreatic mass needle biopsy showed well differentiated adenocarcinoma   05/31/2015 Initial Diagnosis Pancreatic cancer ([place])   05/31/2015 Tumor Marker CA19.9 =375   06/05/2015 - 07/03/2015 Chemotherapy weekly gemcitabine 1032m/m2, dose reduced to 8583mm2 on week 2 due to tolerance issue   08/27/2015 -  Chemotherapy xeloda 100041mid, 14 days on, 7 days off    Review of Systems  Gastrointestinal: Negative for blood in stool.  All other systems reviewed and are negative.   Past Medical History  Diagnosis Date  . Hypertension   . Hypothyroidism   . Diabetes mellitus without complication ([place] . Arthritis     osteoarthritis-hands wrist  . Diverticulosis     showing on CT of abdomen 05-16-15  . [facility]   pancreatic mass- dx. adenocarcinoma" CT abdomen 05-16-15    Past Surgical History  Procedure Laterality Date  . Joint replacement Right   . Shoulder arthroscopy w/ rotator cuff repair Left   . Shoulder open rotator cuff repair Right   . Tubal ligation    . Appendectomy      open  . Ankle surgery Right   . Eye surgery      macular hole surgery repair  . Cataract extraction Right   . Eus N/A 05/28/2015    Procedure: UPPER ENDOSCOPIC ULTRASOUND (EUS)  LINEAR;  Surgeon: Arta Silence, MD;  Location: [facility];  Service: Endoscopy;  Laterality: N/A;    has Carcinoma of body of pancreas ([employer]); Long term current use of anticoagulant therapy; Acute deep vein thrombosis (DVT) of distal vein of left lower extremity ([place]); Hypothyroidism; Hypertension; Diabetes mellitus without complication ([place]); Anemia in neoplastic disease; Lower GI bleeding; Nausea without vomiting; and Hypoalbuminemia due to protein-calorie malnutrition ([place]) on her problem list.    is allergic to keflex and aspirin.    Medication List         This list is accurate as of: 11/19/15  6:37 PM.  Always use your most recent med list.               docusate sodium 100 MG capsule  Commonly known as:  COLACE  Take 100 mg by mouth 3 (three) times daily.     HYDROcodone-acetaminophen 5-325 MG tablet  Commonly known as:  NORCO/VICODIN  Take 1-2 tablets by mouth every 4 (four) hours as needed for moderate pain.     levothyroxine 100 MCG tablet  Commonly known as:  SYNTHROID, LEVOTHROID  Take 100 mcg by mouth daily before breakfast.     metFORMIN 1000 MG tablet  Commonly known as:  GLUCOPHAGE  Take 1,000 mg by mouth 2 (two) times daily with a meal.     omeprazole 20 MG tablet  Commonly known as:  PRILOSEC OTC  Take 20 mg by mouth daily as needed (acid reflux). Reported on 11/12/2015     ondansetron 8 MG tablet  Commonly known as:  ZOFRAN  Take 1 tablet (8 mg total) by mouth 2 (two) times daily as needed (Nausea or vomiting).     Rivaroxaban 15 MG Tabs tablet  Commonly known as:  XARELTO  Take 1 tablet (15 mg total) by mouth 2 (two) times daily with a meal.         PHYSICAL EXAMINATION  Oncology Vitals 11/19/2015 11/16/2015  Height 169 cm -  Weight 63.821 kg -  Weight (lbs) 140 lbs 11 oz -  BMI (kg/m2) 22.37 kg/m2 -  Temp 98 98.5  Pulse 87 70  Resp 18 16  SpO2 98 97  BSA (m2) 1.73 m2 -   BP Readings from Last 2 Encounters:  11/19/15 152/86  11/16/15 120/72    Physical Exam  Constitutional: She is oriented to person, place, and time. Vital signs are normal. She appears malnourished. She appears unhealthy. She appears cachectic.  HENT:  Head: Normocephalic and atraumatic.  Eyes: Conjunctivae and EOM are normal. Pupils are equal, round, and reactive to light. Right eye exhibits no discharge. Left eye exhibits no discharge. No scleral icterus.  Neck: Normal range of motion.  Pulmonary/Chest: Effort normal. No respiratory distress.  Musculoskeletal: Normal range of motion. She exhibits no edema or tenderness.   Neurological: She is alert and oriented to person, place, and time. Gait normal.  Skin: Skin is warm and dry. No rash noted. No erythema. No pallor.  Psychiatric: Affect normal.  Nursing note and vitals reviewed.   LABORATORY DATA:. Appointment on 11/19/2015  Component Date Value Ref Range Status  . WBC 11/19/2015 4.9  3.9 - 10.3 10e3/uL Final  . NEUT# 11/19/2015 3.0  1.5 - 6.5 10e3/uL Final  . HGB 11/19/2015 10.7* 11.6 - 15.9 g/dL Final  . HCT 11/19/2015 32.9* 34.8 - 46.6 % Final  . Platelets 11/19/2015 217  145 - 400 10e3/uL Final  . MCV 11/19/2015 94.3  79.5 - 101.0 fL  Final  . MCH 11/19/2015 30.6  25.1 - 34.0 pg Final  . MCHC 11/19/2015 32.5  31.5 - 36.0 g/dL Final  . RBC 11/19/2015 3.49* 3.70 - 5.45 10e6/uL Final  . RDW 11/19/2015 24.2* 11.2 - 14.5 % Final  . lymph# 11/19/2015 1.1  0.9 - 3.3 10e3/uL Final  . MONO# 11/19/2015 0.5  0.1 - 0.9 10e3/uL Final  . Eosinophils Absolute 11/19/2015 0.1  0.0 - 0.5 10e3/uL Final  . Basophils Absolute 11/19/2015 0.1  0.0 - 0.1 10e3/uL Final  . NEUT% 11/19/2015 62.4  38.4 - 76.8 % Final  . LYMPH% 11/19/2015 23.3  14.0 - 49.7 % Final  . MONO% 11/19/2015 10.1  0.0 - 14.0 % Final  . EOS% 11/19/2015 2.9  0.0 - 7.0 % Final  . BASO% 11/19/2015 1.3  0.0 - 2.0 % Final  . Sodium 11/19/2015 140  136 - 145 mEq/L Final  . Potassium 11/19/2015 3.6  3.5 - 5.1 mEq/L Final  . Chloride 11/19/2015 108  98 - 109 mEq/L Final  . CO2 11/19/2015 23  22 - 29 mEq/L Final  . Glucose 11/19/2015 180* 70 - 140 mg/dl Final   Glucose reference range is for nonfasting patients. Fasting glucose reference range is 70- 100.  Marland Kitchen BUN 11/19/2015 16.7  7.0 - 26.0 mg/dL Final  . Creatinine 11/19/2015 0.9  0.6 - 1.1 mg/dL Final  . Total Bilirubin 11/19/2015 0.37  0.20 - 1.20 mg/dL Final  . Alkaline Phosphatase 11/19/2015 99  40 - 150 U/L Final  . AST 11/19/2015 11  5 - 34 U/L Final  . ALT 11/19/2015 <9  0 - 55 U/L Final  . Total Protein 11/19/2015 6.0* 6.4 - 8.3 g/dL Final   . Albumin 11/19/2015 2.6* 3.5 - 5.0 g/dL Final  . Calcium 11/19/2015 8.5  8.4 - 10.4 mg/dL Final  . Anion Gap 11/19/2015 9  3 - 11 mEq/L Final  . EGFR 11/19/2015 60* >90 ml/min/1.73 m2 Final   eGFR is calculated using the CKD-EPI Creatinine Equation (2009)  . Ferritin 11/19/2015 28  9 - 269 ng/ml Final  . Iron 11/19/2015 38* 41 - 142 ug/dL Final  . TIBC 11/19/2015 264  236 - 444 ug/dL Final  . UIBC 11/19/2015 226  120 - 384 ug/dL Final  . %SAT 11/19/2015 14* 21 - 57 % Final    RADIOGRAPHIC STUDIES: No results found.  ASSESSMENT/PLAN:    Nausea without vomiting Patient continues to experience chronic nausea; but no vomiting.  She has anti--nausea medications at home to take on an as-needed basis.  Lower GI bleeding Patient was instructed to hold her Xarelto when she was at the [facility] this past Friday, 11/16/2015.  She states that she experienced one episode of bright red bloody stools, early Saturday morning; but has since had no further GI bleeding whatsoever.  She denies any abdominal discomfort as well.  Patient received 2 units packed red blood cells transfusion this past Friday, 11/16/2015; and hemoglobin was stable today at 10.7.  Reviewed all findings with Dr.Feng; and patient was given the options of restarting the Xarelto at a lower dose, Lovenox injections, or placement of a Greenfield filter.  Patient states that she has no interest in resuming the Xarelto or the Coumadin.  She also refuses to consider Lovenox injections as well.  She has no interest in the tenia a Greenfield filter as well.  She states that she clearly understands that she is at high risk for recurrent blood clot since she has  had blood clots in the past.  She was also advised that pancreatic cancer.  Has a high incidence of blood clots as well.  Patient was adamant that she hold any further anticoagulation.  Patient was then advised to try taking a baby aspirin 80 mg on a daily basis instead.  Patient  was comfortable with this plan.  Patient was advised to call/return or go directly to the emergency department for any worsening symptoms whatsoever.  Long term current use of anticoagulant therapy Patient was instructed to hold her Xarelto when she was at the [facility] this past Friday, 11/16/2015.  She states that she experienced one episode of bright red bloody stools, early Saturday morning; but has since had no further GI bleeding whatsoever.  She denies any abdominal discomfort as well.  Patient received 2 units packed red blood cells transfusion this past Friday, 11/16/2015; and hemoglobin was stable today at 10.7.  Reviewed all findings with Dr.Feng; and patient was given the options of restarting the Xarelto at a lower dose, Lovenox injections, or placement of a Greenfield filter.  Patient states that she has no interest in resuming the Xarelto or the Coumadin.  She also refuses to consider Lovenox injections as well.  She has no interest in the tenia a Greenfield filter as well.  She states that she clearly understands that she is at high risk for recurrent blood clot since she has had blood clots in the past.  She was also advised that pancreatic cancer.  Has a high incidence of blood clots as well.  Patient was adamant that she hold any further anticoagulation.  Patient was then advised to try taking a baby aspirin 80 mg on a daily basis instead.  Patient was comfortable with this plan.  Patient was advised to call/return or go directly to the emergency department for any worsening symptoms whatsoever.  Carcinoma of body of pancreas ([place]) Most recent restaging scan revealed progression of patient's disease.  Therefore, patient will hold any further Xeloda oral chemotherapy.  Patient is scheduled to initiate palliative radiation treatments this coming Wednesday, 11/21/2015.  Patient's last radiation treatment will be scheduled on 12/04/2015 for a total of 10 radiation  treatments.  Patient will be scheduled for labs and a follow-up visit in approximately one month around 12/20/2015.     Patient stated understanding of all instructions; and was in agreement with this plan of care. The patient knows to call the clinic with any problems, questions or concerns.   Total time spent with patient was 25 minutes;  with greater than 75 percent of that time spent in face to face counseling regarding patient's symptoms,  and coordination of care and follow up.  Disclaimer:This dictation was prepared with Dragon/digital dictation along with [employer]. Any transcriptional errors that result from this process are unintentional.  Drue Second, NP 11/19/2015

## 2015-11-19 NOTE — Assessment & Plan Note (Signed)
Most recent restaging scan revealed progression of patient's disease.  Therefore, patient will hold any further Xeloda oral chemotherapy.  Patient is scheduled to initiate palliative radiation treatments this coming Wednesday, 11/21/2015.  Patient's last radiation treatment will be scheduled on 12/04/2015 for a total of 10 radiation treatments.  Patient will be scheduled for labs and a follow-up visit in approximately one month around 12/20/2015.

## 2015-11-19 NOTE — Assessment & Plan Note (Signed)
Patient continues to experience chronic nausea; but no vomiting.  She has anti--nausea medications at home to take on an as-needed basis.

## 2015-11-20 ENCOUNTER — Other Ambulatory Visit: Payer: Medicare Other

## 2015-11-20 ENCOUNTER — Ambulatory Visit: Payer: Medicare Other | Admitting: Hematology

## 2015-11-20 DIAGNOSIS — Z510 Encounter for antineoplastic radiation therapy: Secondary | ICD-10-CM | POA: Diagnosis not present

## 2015-11-21 ENCOUNTER — Other Ambulatory Visit: Payer: Self-pay | Admitting: Nurse Practitioner

## 2015-11-21 ENCOUNTER — Ambulatory Visit
Admission: RE | Admit: 2015-11-21 | Discharge: 2015-11-21 | Disposition: A | Discharge status: Home / Self Care | Payer: Medicare Other | Source: Ambulatory Visit | Attending: Radiation Oncology | Admitting: Radiation Oncology

## 2015-11-21 DIAGNOSIS — Z510 Encounter for antineoplastic radiation therapy: Secondary | ICD-10-CM | POA: Diagnosis not present

## 2015-11-22 ENCOUNTER — Ambulatory Visit
Admission: RE | Admit: 2015-11-22 | Discharge: 2015-11-22 | Disposition: A | Discharge status: Home / Self Care | Payer: Medicare Other | Source: Ambulatory Visit | Attending: Radiation Oncology | Admitting: Radiation Oncology

## 2015-11-22 DIAGNOSIS — Z510 Encounter for antineoplastic radiation therapy: Secondary | ICD-10-CM | POA: Diagnosis not present

## 2015-11-23 ENCOUNTER — Encounter: Payer: Self-pay | Admitting: Radiation Oncology

## 2015-11-23 ENCOUNTER — Encounter: Payer: Self-pay | Admitting: *Deleted

## 2015-11-23 ENCOUNTER — Ambulatory Visit
Admission: RE | Admit: 2015-11-23 | Discharge: 2015-11-23 | Disposition: A | Discharge status: Home / Self Care | Payer: Medicare Other | Source: Ambulatory Visit | Attending: Radiation Oncology | Admitting: Radiation Oncology

## 2015-11-23 VITALS — BP 128/77 | HR 87 | Temp 98.7°F | Resp 16 | Wt 139.6 lb

## 2015-11-23 DIAGNOSIS — C251 Malignant neoplasm of body of pancreas: Secondary | ICD-10-CM

## 2015-11-23 DIAGNOSIS — Z510 Encounter for antineoplastic radiation therapy: Secondary | ICD-10-CM | POA: Diagnosis not present

## 2015-11-23 NOTE — Progress Notes (Signed)
[facility] Psychosocial Distress Screening Clinical Social Work  Clinical Social Work was referred by distress screening protocol.  The patient scored a 7 on the Psychosocial Distress Thermometer which indicates severe distress. Clinical Social Worker phoned pt to assess for distress and other psychosocial needs. Pt reports she is coping better since starting treatment and has no more anxiety. She reports to have good supports and feels all her concerns were addressed by her medical team. Pt states she was aware of Support Team and appreciated call for support. No needs identified and she agrees to reach out as needed.   ON[facility]N DISTRESS SCREENING 11/12/2015  Screening Type Initial Screening  Distress experienced in past week (1-10) 7  Emotional problem type Nervousness/Anxiety;Adjusting to illness;Feeling hopeless  Spiritual/Religous concerns type Facing my mortality  Physical Problem type Pain;Nausea/vomiting;Sleep/insomnia;Constipation/diarrhea  Physician notified of physical symptoms Yes  Referral to clinical social work Yes  Referral to dietition Yes    Clinical Social Worker follow up needed: No.  If yes, follow up plan: Loren Racer, [place]  [facility] Phone: [phone] Fax: [phone]

## 2015-11-23 NOTE — Progress Notes (Signed)
Weekly rad tx abdomen 2 fractions/ 10 completed, no c/o pain, gas, nausea, diarrhea, pt education done, radiation therapy and you book given,  Fatigue low stated, appetite okay, no pain 3:50 PM BP 128/77 mmHg  Pulse 87  Temp(Src) 98.7 F (37.1 C) (Oral)  Resp 16  Wt 139 lb 9.6 oz (63.322 kg)  Wt Readings from Last 3 Encounters:  11/23/15 139 lb 9.6 oz (63.322 kg)  11/19/15 140 lb 11.2 oz (63.821 kg)  11/16/15 137 lb 11.2 oz (62.46 kg)

## 2015-11-24 NOTE — Progress Notes (Signed)
  Radiation Oncology         [phone] ________________________________  Name: Nicole Sherman MRN: [id number]  Date: 11/14/2015  DOB: Mar 28, 1934  SIMULATION AND TREATMENT PLANNING NOTE  DIAGNOSIS:     ICD-9-CM ICD-10-CM   1. Carcinoma of body of pancreas ([place]) 157.1 C25.1      Site:   Pancreas  NARRATIVE:  The patient was brought to the [facility].  Identity was confirmed.  All relevant records and images related to the planned course of therapy were reviewed.   Written consent to proceed with treatment was confirmed which was freely given after reviewing the details related to the planned course of therapy had been reviewed with the patient.  Then, the patient was set-up in a stable reproducible  supine position for radiation therapy.  CT images were obtained.  Surface markings were placed.    Medically necessary complex treatment device(s) for immobilization:  Customized vac lock bag.   The CT images were loaded into the planning software.  Then the target and avoidance structures were contoured.  Treatment planning then occurred.  The radiation prescription was entered and confirmed.  A total of 4 complex treatment devices were fabricated which relate to the designed radiation treatment fields. Each of these customized fields/ complex treatment devices will be used on a daily basis during the radiation course. I have requested : 3D Simulation  I have requested a DVH of the following structures: Target, liver, spinal cord, kidneys.    The patient will undergo daily image guidance to ensure accurate localization of the target, and adequate minimize dose to the normal surrounding structures in close proximity to the target.   PLAN:  The patient will receive 30 Gy in 10 fractions. No chemotherapy will be given during the patient's treatment  ________________________________   Jodelle Gross, MD, PhD

## 2015-11-24 NOTE — Progress Notes (Signed)
   Department of Radiation Oncology  Phone:  [phone] Fax:        [phone]  Weekly Treatment Note    Name: Nicole Sherman Date: 11/24/2015 MRN: [id number] DOB: March 03, 1934   Diagnosis:     ICD-9-CM ICD-10-CM   1. Carcinoma of body of pancreas ([place]) 157.1 C25.1      Current dose: 6 Gy  Current fraction: 2   MEDICATIONS: Current Outpatient Prescriptions  Medication Sig Dispense Refill  . docusate sodium (COLACE) 100 MG capsule Take 100 mg by mouth 3 (three) times daily.    Marland Kitchen HYDROcodone-acetaminophen (NORCO/VICODIN) 5-325 MG tablet Take 1-2 tablets by mouth every 4 (four) hours as needed for moderate pain. 60 tablet 0  . levothyroxine (SYNTHROID, LEVOTHROID) 100 MCG tablet Take 100 mcg by mouth daily before breakfast.     . metFORMIN (GLUCOPHAGE) 1000 MG tablet Take 1,000 mg by mouth 2 (two) times daily with a meal.     . ondansetron (ZOFRAN) 8 MG tablet TAKE ONE TABLET BY MOUTH TWICE DAILY AS NEEDED FOR NAUSEA AND VOMITING 30 tablet 0  . omeprazole (PRILOSEC OTC) 20 MG tablet Take 20 mg by mouth daily as needed (acid reflux). Reported on 11/23/2015     No current facility-administered medications for this encounter.     ALLERGIES: Keflex and Aspirin   LABORATORY DATA:  Lab Results  Component Value Date   WBC 4.9 11/19/2015   HGB 10.7* 11/19/2015   HCT 32.9* 11/19/2015   MCV 94.3 11/19/2015   PLT 217 11/19/2015   Lab Results  Component Value Date   NA 140 11/19/2015   K 3.6 11/19/2015   CL 97* 06/08/2015   CO2 23 11/19/2015   Lab Results  Component Value Date   ALT <9 11/19/2015   AST 11 11/19/2015   ALKPHOS 99 11/19/2015   BILITOT 0.37 11/19/2015     NARRATIVE: Nicole Sherman was seen today for weekly treatment management. The chart was checked and the patient's films were reviewed.  Weekly rad tx abdomen 2 fractions/ 10 completed, no c/o pain, gas, nausea, diarrhea, pt education done, radiation therapy and you book given,  Fatigue low stated,  appetite okay, no pain 10:21 AM BP 128/77 mmHg  Pulse 87  Temp(Src) 98.7 F (37.1 C) (Oral)  Resp 16  Wt 139 lb 9.6 oz (63.322 kg)  Wt Readings from Last 3 Encounters:  11/23/15 139 lb 9.6 oz (63.322 kg)  11/19/15 140 lb 11.2 oz (63.821 kg)  11/16/15 137 lb 11.2 oz (62.46 kg)    PHYSICAL EXAMINATION: weight is 139 lb 9.6 oz (63.322 kg). Her oral temperature is 98.7 F (37.1 C). Her blood pressure is 128/77 and her pulse is 87. Her respiration is 16.        ASSESSMENT: The patient is doing satisfactorily with treatment.  The patient is doing very well with the beginning of her course of radiation treatment.  PLAN: We will continue with the patient's radiation treatment as planned.

## 2015-11-26 ENCOUNTER — Ambulatory Visit
Admission: RE | Admit: 2015-11-26 | Discharge: 2015-11-26 | Disposition: A | Discharge status: Home / Self Care | Payer: Medicare Other | Source: Ambulatory Visit | Attending: Radiation Oncology | Admitting: Radiation Oncology

## 2015-11-26 DIAGNOSIS — Z510 Encounter for antineoplastic radiation therapy: Secondary | ICD-10-CM | POA: Diagnosis not present

## 2015-11-27 ENCOUNTER — Ambulatory Visit
Admission: RE | Admit: 2015-11-27 | Discharge: 2015-11-27 | Disposition: A | Discharge status: Home / Self Care | Payer: Medicare Other | Source: Ambulatory Visit | Attending: Radiation Oncology | Admitting: Radiation Oncology

## 2015-11-27 DIAGNOSIS — Z510 Encounter for antineoplastic radiation therapy: Secondary | ICD-10-CM | POA: Diagnosis not present

## 2015-11-28 ENCOUNTER — Ambulatory Visit
Admission: RE | Admit: 2015-11-28 | Discharge: 2015-11-28 | Disposition: A | Discharge status: Home / Self Care | Payer: Medicare Other | Source: Ambulatory Visit | Attending: Radiation Oncology | Admitting: Radiation Oncology

## 2015-11-28 DIAGNOSIS — Z510 Encounter for antineoplastic radiation therapy: Secondary | ICD-10-CM | POA: Diagnosis not present

## 2015-11-29 ENCOUNTER — Telehealth: Payer: Self-pay | Admitting: *Deleted

## 2015-11-29 ENCOUNTER — Ambulatory Visit: Payer: Medicare Other

## 2015-11-29 NOTE — Telephone Encounter (Signed)
Patient called  stating "she hasn't had a bowel movement in 4 days, and is very constipated, has taken colace, and dulcolax not has helped,", cancelling today's appt, will try magnesium citrate and will call back later if no results, informed her I am here today till 5pm 11:59 AM

## 2015-11-30 ENCOUNTER — Encounter: Payer: Self-pay | Admitting: Radiation Oncology

## 2015-11-30 ENCOUNTER — Ambulatory Visit
Admission: RE | Admit: 2015-11-30 | Discharge: 2015-11-30 | Disposition: A | Discharge status: Home / Self Care | Payer: Medicare Other | Source: Ambulatory Visit | Attending: Radiation Oncology | Admitting: Radiation Oncology

## 2015-11-30 VITALS — BP 145/79 | HR 89 | Temp 99.2°F | Ht 66.5 in | Wt 138.3 lb

## 2015-11-30 DIAGNOSIS — C251 Malignant neoplasm of body of pancreas: Secondary | ICD-10-CM

## 2015-11-30 DIAGNOSIS — Z510 Encounter for antineoplastic radiation therapy: Secondary | ICD-10-CM | POA: Diagnosis not present

## 2015-11-30 NOTE — Progress Notes (Signed)
Department of Radiation Oncology  Phone:  [phone] Fax:        [phone]  Weekly Treatment Note    Name: Nicole Sherman Date: 11/30/2015 MRN: [id number] DOB: Dec 30, 1933   Diagnosis:     ICD-9-CM ICD-10-CM   1. Carcinoma of body of pancreas ([place]) 157.1 C25.1      Current dose: 21 Gy  Current fraction: 7   MEDICATIONS: Current Outpatient Prescriptions  Medication Sig Dispense Refill  . docusate sodium (COLACE) 100 MG capsule Take 100 mg by mouth 3 (three) times daily.    Marland Kitchen HYDROcodone-acetaminophen (NORCO/VICODIN) 5-325 MG tablet Take 1-2 tablets by mouth every 4 (four) hours as needed for moderate pain. 60 tablet 0  . levothyroxine (SYNTHROID, LEVOTHROID) 100 MCG tablet Take 100 mcg by mouth daily before breakfast.     . magnesium citrate SOLN Take 1 Bottle by mouth once.    . metFORMIN (GLUCOPHAGE) 1000 MG tablet Take 1,000 mg by mouth 2 (two) times daily with a meal.     . ondansetron (ZOFRAN) 8 MG tablet TAKE ONE TABLET BY MOUTH TWICE DAILY AS NEEDED FOR NAUSEA AND VOMITING 30 tablet 0  . omeprazole (PRILOSEC OTC) 20 MG tablet Take 20 mg by mouth daily as needed (acid reflux). Reported on 11/23/2015     No current facility-administered medications for this encounter.      ALLERGIES: Keflex [cephalexin] and Aspirin   LABORATORY DATA:  Lab Results  Component Value Date   WBC 4.9 11/19/2015   HGB 10.7 (L) 11/19/2015   HCT 32.9 (L) 11/19/2015   MCV 94.3 11/19/2015   PLT 217 11/19/2015   Lab Results  Component Value Date   NA 140 11/19/2015   K 3.6 11/19/2015   CL 97 (L) 06/08/2015   CO2 23 11/19/2015   Lab Results  Component Value Date   ALT <9 11/19/2015   AST 11 11/19/2015   ALKPHOS 99 11/19/2015   BILITOT 0.37 11/19/2015     NARRATIVE: Nicole Sherman was seen today for weekly treatment management. The chart was checked and the patient's films were reviewed.   Nicole Sherman has completed 7 fractions to her abdomen.  She denies having pain.  She  reports she was having constipation with no bowel movement for 4 days.  She took mag citrate yesterday which "cleaned her out."  She reports having severe nausea with vomiting x 1 yesterday.  She has been taking zofran 8 mg 4 tablets daily which is more than her prescription of 2 a day.  She reports having fatigue.  She denies having fatigue.  6:32 PM BP (!) 145/79 (BP Location: Right Arm, Patient Position: Sitting)   Pulse 89   Temp 99.2 F (37.3 C) (Oral)   Ht 5' 6.5" (1.689 m)   Wt 138 lb 4.8 oz (62.7 kg)   SpO2 100%   BMI 21.99 kg/m   Wt Readings from Last 3 Encounters:  11/30/15 138 lb 4.8 oz (62.7 kg)  11/23/15 139 lb 9.6 oz (63.3 kg)  11/19/15 140 lb 11.2 oz (63.8 kg)    PHYSICAL EXAMINATION: height is 5' 6.5" (1.689 m) and weight is 138 lb 4.8 oz (62.7 kg). Her oral temperature is 99.2 F (37.3 C). Her blood pressure is 145/79 (abnormal) and her pulse is 89. Her oxygen saturation is 100%.        ASSESSMENT: The patient is doing satisfactorily with treatment.  The patient is doing very well with the beginning of her course of radiation treatment.  PLAN: We will continue with the patient's radiation treatment as planned.        This document serves as a record of services personally performed by Tyler Pita, MD. It was created on his behalf by Truddie Hidden, a trained medical scribe. The creation of this record is based on the scribe's personal observations and the provider's statements to them. This document has been checked and approved by the attending provider.

## 2015-11-30 NOTE — Progress Notes (Signed)
Nicole Sherman has completed 7 fractions to her abdomen.  She denies having pain.  She reports she was having constipation with no bowel movement for 4 days.  She took mag citrate yesterday which "cleaned her out."  She reports having severe nausea with vomiting x 1 yesterday.  She has been taking zofran 8 mg 4 tablets daily which is more than her prescription of 2 a day.  She reports having fatigue.  She denies having fatigue.  BP (!) 145/79 (BP Location: Right Arm, Patient Position: Sitting)   Pulse 89   Temp 99.2 F (37.3 C) (Oral)   Ht 5' 6.5" (1.689 m)   Wt 138 lb 4.8 oz (62.7 kg)   SpO2 100%   BMI 21.99 kg/m    Wt Readings from Last 3 Encounters:  11/30/15 138 lb 4.8 oz (62.7 kg)  11/23/15 139 lb 9.6 oz (63.3 kg)  11/19/15 140 lb 11.2 oz (63.8 kg)

## 2015-12-03 ENCOUNTER — Ambulatory Visit
Admission: RE | Admit: 2015-12-03 | Discharge: 2015-12-03 | Disposition: A | Discharge status: Home / Self Care | Payer: Medicare Other | Source: Ambulatory Visit | Attending: Radiation Oncology | Admitting: Radiation Oncology

## 2015-12-03 DIAGNOSIS — Z510 Encounter for antineoplastic radiation therapy: Secondary | ICD-10-CM | POA: Diagnosis not present

## 2015-12-04 ENCOUNTER — Ambulatory Visit
Admission: RE | Admit: 2015-12-04 | Discharge: 2015-12-04 | Disposition: A | Discharge status: Home / Self Care | Payer: Medicare Other | Source: Ambulatory Visit | Attending: Radiation Oncology | Admitting: Radiation Oncology

## 2015-12-04 ENCOUNTER — Ambulatory Visit: Payer: Medicare Other

## 2015-12-04 ENCOUNTER — Other Ambulatory Visit: Payer: Self-pay | Admitting: *Deleted

## 2015-12-04 DIAGNOSIS — Z510 Encounter for antineoplastic radiation therapy: Secondary | ICD-10-CM | POA: Diagnosis not present

## 2015-12-04 DIAGNOSIS — C259 Malignant neoplasm of pancreas, unspecified: Secondary | ICD-10-CM

## 2015-12-04 MED ORDER — HYDROCODONE-ACETAMINOPHEN 5-325 MG PO TABS: 1.0000 | ORAL_TABLET | ORAL | 0 refills | Status: DC | PRN

## 2015-12-04 NOTE — Telephone Encounter (Signed)
Received vm call from pt's husband asking for refill on her pain med.

## 2015-12-05 ENCOUNTER — Ambulatory Visit
Admission: RE | Admit: 2015-12-05 | Discharge: 2015-12-05 | Disposition: A | Discharge status: Home / Self Care | Payer: Medicare Other | Source: Ambulatory Visit | Attending: Radiation Oncology | Admitting: Radiation Oncology

## 2015-12-05 ENCOUNTER — Encounter: Payer: Self-pay | Admitting: Radiation Oncology

## 2015-12-05 DIAGNOSIS — Z510 Encounter for antineoplastic radiation therapy: Secondary | ICD-10-CM | POA: Diagnosis not present

## 2015-12-05 DIAGNOSIS — C251 Malignant neoplasm of body of pancreas: Secondary | ICD-10-CM

## 2015-12-05 NOTE — Progress Notes (Addendum)
Patient completed rad tx abdomen 10/10, has had nausea and takes zofran bid, sometimes 3-4x day stated, had dark bowel movement last night, almost black stated, very fatigued,, appetite poor, 1 month f/u appt with Shona Simpson, PA on 01/08/16 BP 129/82 (BP Location: Left Arm, Patient Position: Sitting, Cuff Size: Normal)   Pulse 85   Temp 98.4 F (36.9 C) (Oral)   Resp 20   Wt 137 lb (62.1 kg)   SpO2 98%   BMI 21.78 kg/m   Wt Readings from Last 3 Encounters:  12/05/15 137 lb (62.1 kg)  11/30/15 138 lb 4.8 oz (62.7 kg)  11/23/15 139 lb 9.6 oz (63.3 kg)

## 2015-12-06 NOTE — Progress Notes (Signed)
   Department of Radiation Oncology  Phone:  [phone] Fax:        [phone]  Weekly Treatment Note    Name: Nicole Sherman Date: 12/06/2015 MRN: [id number] DOB: 1933/09/12   Diagnosis:     ICD-9-CM ICD-10-CM   1. Carcinoma of body of pancreas ([place]) 157.1 C25.1      Current dose: 30 Gy  Current fraction: 10   MEDICATIONS: Current Outpatient Prescriptions  Medication Sig Dispense Refill  . docusate sodium (COLACE) 100 MG capsule Take 100 mg by mouth 3 (three) times daily.    Marland Kitchen HYDROcodone-acetaminophen (NORCO/VICODIN) 5-325 MG tablet Take 1-2 tablets by mouth every 4 (four) hours as needed for moderate pain. 60 tablet 0  . levothyroxine (SYNTHROID, LEVOTHROID) 100 MCG tablet Take 100 mcg by mouth daily before breakfast.     . magnesium citrate SOLN Take 1 Bottle by mouth once.    . metFORMIN (GLUCOPHAGE) 1000 MG tablet Take 1,000 mg by mouth 2 (two) times daily with a meal.     . omeprazole (PRILOSEC OTC) 20 MG tablet Take 20 mg by mouth daily as needed (acid reflux). Reported on 11/23/2015    . ondansetron (ZOFRAN) 8 MG tablet TAKE ONE TABLET BY MOUTH TWICE DAILY AS NEEDED FOR NAUSEA AND VOMITING 30 tablet 0   No current facility-administered medications for this encounter.      ALLERGIES: Keflex [cephalexin] and Aspirin   LABORATORY DATA:  Lab Results  Component Value Date   WBC 4.9 11/19/2015   HGB 10.7 (L) 11/19/2015   HCT 32.9 (L) 11/19/2015   MCV 94.3 11/19/2015   PLT 217 11/19/2015   Lab Results  Component Value Date   NA 140 11/19/2015   K 3.6 11/19/2015   CL 97 (L) 06/08/2015   CO2 23 11/19/2015   Lab Results  Component Value Date   ALT <9 11/19/2015   AST 11 11/19/2015   ALKPHOS 99 11/19/2015   BILITOT 0.37 11/19/2015     NARRATIVE: Nicole Sherman was seen today for weekly treatment management. The chart was checked and the patient's films were reviewed.  Patient completed rad tx abdomen 10/10, has had nausea and takes zofran bid,  sometimes 3-4x day stated, had dark bowel movement last night, almost black stated, very fatigued,, appetite poor, 1 month f/u appt with Shona Simpson, PA on 01/08/16 There were no vitals taken for this visit.  Wt Readings from Last 3 Encounters:  12/05/15 137 lb (62.1 kg)  11/30/15 138 lb 4.8 oz (62.7 kg)  11/23/15 139 lb 9.6 oz (63.3 kg)    PHYSICAL EXAMINATION: vitals were not taken for this visit.     Alert, in no acute distress  ASSESSMENT: The patient is doing satisfactorily with treatment. She finished her final fraction today.  PLAN: The patient will follow-up in one month. We discussed that her nausea should improve now that she has finished her treatment. She does have nausea medicine to take as needed.

## 2015-12-07 ENCOUNTER — Other Ambulatory Visit: Payer: Self-pay | Admitting: Nurse Practitioner

## 2015-12-17 NOTE — Progress Notes (Signed)
  Radiation Oncology         [phone] ________________________________  Name: Nicole Sherman MRN: [id number]  Date: 12/05/2015  DOB: 16-Apr-1934  End of Treatment Note  Diagnosis:   Unresectable stage IIB cT2N1M0 pancreatic adenocarcinoma, well differentiated     Indication for treatment: Palliative     Radiation treatment dates: 11/21/15 - 12/05/15  Site/dose: 30 Gy in 10 fractions to the abdomen-pancreas.  Beams/energy: 3D // 15X, 6X Photon  Narrative: The patient tolerated radiation treatment relatively well. The patient's main complaints were nausea and constipation.  Plan: The patient has completed radiation treatment. The patient will return to radiation oncology clinic for routine followup in one month. I advised them to call or return sooner if they have any questions or concerns related to their recovery or treatment.  ------------------------------------------------  Jodelle Gross, MD, PhD  This document serves as a record of services personally performed by Kyung Rudd, MD. It was created on his behalf by Darcus Austin, a trained medical scribe. The creation of this record is based on the scribe's personal observations and the provider's statements to them. This document has been checked and approved by the attending provider.

## 2015-12-18 ENCOUNTER — Other Ambulatory Visit: Payer: Self-pay | Admitting: Hematology

## 2015-12-18 ENCOUNTER — Telehealth: Payer: Self-pay | Admitting: *Deleted

## 2015-12-18 MED ORDER — PROCHLORPERAZINE MALEATE 5 MG PO TABS: 5.0000 mg | ORAL_TABLET | Freq: Three times a day (TID) | ORAL | 2 refills | Status: DC | PRN

## 2015-12-18 MED FILL — PROCHLORPERAZINE 5 MG TAB: 5 | 5 days supply | Qty: 30 | Fill #0

## 2015-12-18 NOTE — Telephone Encounter (Signed)
Patient family member called stating that patient has become severely nauseous since completing radiation and would like an alternative to zofran sent to [facility]. Please call [phone] once this is sent.

## 2015-12-18 NOTE — Telephone Encounter (Signed)
Spoke with pt and informed pt that a script for Compazine was sent to [place] by Dr. Burr Medico.  Cautioned pt that med can cause drowsiness, and for her not to drive after taking med.  Pt stated her husband will drive pt to places if needed.

## 2015-12-18 NOTE — Telephone Encounter (Signed)
I called in compazine 5-10mg  q8h PRN to [facility] pharmacy, please let her know.  [name]  12/18/2015

## 2015-12-24 ENCOUNTER — Telehealth: Payer: Self-pay | Admitting: *Deleted

## 2015-12-24 ENCOUNTER — Other Ambulatory Visit: Payer: Self-pay | Admitting: *Deleted

## 2015-12-24 DIAGNOSIS — C259 Malignant neoplasm of pancreas, unspecified: Secondary | ICD-10-CM

## 2015-12-24 MED ORDER — HYDROCODONE-ACETAMINOPHEN 5-325 MG PO TABS: 1.0000 | ORAL_TABLET | ORAL | 0 refills | Status: DC | PRN

## 2015-12-24 NOTE — Telephone Encounter (Signed)
Oncology Nurse Navigator Documentation  Oncology Nurse Navigator Flowsheets 12/24/2015  Navigator Location [facility]  Navigator Encounter Type Telephone  Telephone Outgoing Call;Symptom Mgt  Abnormal Finding Date -  Treatment Initiated Date -  Patient Visit Type -  Treatment Phase -  Barriers/Navigation Needs -need pain med refill-will be out before 8/28 appointment.  Education -  Interventions Other--made MD aware that she needs a refill on vicodin 5/325  Referrals -  Coordination of Care -  Education Method -  Support Groups/Services -  Acuity Level 1  Time Spent with Patient 15  Navigator call to follow up on patient: she reports nausea is much improved with the compazine.Still eating few small meals/day-gets full easily. Takes 3-4 pain pills per day to remain comfortable. No s/s of blood clot. She requests to be called when script ready and her husband will pick it up.

## 2015-12-24 NOTE — Telephone Encounter (Signed)
Spoke with pt and informed pt that script for Vicodin 5/325 is ready for husband to pick up on 12/25/15.

## 2015-12-25 MED FILL — PROCHLORPERAZINE 5 MG TAB: 5 | 5 days supply | Qty: 30 | Fill #1

## 2015-12-31 ENCOUNTER — Other Ambulatory Visit (HOSPITAL_BASED_OUTPATIENT_CLINIC_OR_DEPARTMENT_OTHER): Payer: Medicare Other

## 2015-12-31 ENCOUNTER — Telehealth: Payer: Self-pay | Admitting: Hematology

## 2015-12-31 ENCOUNTER — Ambulatory Visit (HOSPITAL_BASED_OUTPATIENT_CLINIC_OR_DEPARTMENT_OTHER): Payer: Medicare Other | Admitting: Hematology

## 2015-12-31 VITALS — BP 159/80 | HR 81 | Temp 98.2°F | Resp 16 | Ht 66.5 in | Wt 138.9 lb

## 2015-12-31 DIAGNOSIS — I824Z2 Acute embolism and thrombosis of unspecified deep veins of left distal lower extremity: Secondary | ICD-10-CM | POA: Diagnosis not present

## 2015-12-31 DIAGNOSIS — E039 Hypothyroidism, unspecified: Secondary | ICD-10-CM | POA: Diagnosis not present

## 2015-12-31 DIAGNOSIS — R110 Nausea: Secondary | ICD-10-CM

## 2015-12-31 DIAGNOSIS — I10 Essential (primary) hypertension: Secondary | ICD-10-CM | POA: Diagnosis not present

## 2015-12-31 DIAGNOSIS — Z7901 Long term (current) use of anticoagulants: Secondary | ICD-10-CM

## 2015-12-31 DIAGNOSIS — C251 Malignant neoplasm of body of pancreas: Secondary | ICD-10-CM

## 2015-12-31 DIAGNOSIS — D630 Anemia in neoplastic disease: Secondary | ICD-10-CM

## 2015-12-31 DIAGNOSIS — E119 Type 2 diabetes mellitus without complications: Secondary | ICD-10-CM

## 2015-12-31 DIAGNOSIS — K922 Gastrointestinal hemorrhage, unspecified: Secondary | ICD-10-CM

## 2015-12-31 LAB — COMPREHENSIVE METABOLIC PANEL
ANION GAP: 7 meq/L (ref 3–11)
AST: 16 U/L (ref 5–34)
Albumin: 2.3 g/dL — ABNORMAL LOW (ref 3.5–5.0)
Alkaline Phosphatase: 87 U/L (ref 40–150)
BUN: 19.7 mg/dL (ref 7.0–26.0)
CO2: 26 mEq/L (ref 22–29)
Calcium: 8.4 mg/dL (ref 8.4–10.4)
Chloride: 107 mEq/L (ref 98–109)
Creatinine: 0.8 mg/dL (ref 0.6–1.1)
EGFR: 72 mL/min/{1.73_m2} — ABNORMAL LOW (ref >90)
Glucose: 134 mg/dl (ref 70–140)
Potassium: 4 mEq/L (ref 3.5–5.1)
Sodium: 140 mEq/L (ref 136–145)
TOTAL PROTEIN: 5.7 g/dL — AB (ref 6.4–8.3)
Total Bilirubin: <0.3 mg/dL (ref 0.20–1.20)

## 2015-12-31 LAB — IRON AND TIBC
%SAT: 32 % (ref 21–57)
IRON: 66 ug/dL (ref 41–142)
TIBC: 206 ug/dL — AB (ref 236–444)
UIBC: 140 ug/dL (ref 120–384)

## 2015-12-31 LAB — CBC WITH DIFFERENTIAL/PLATELET
BASO%: 0.5 % (ref 0.0–2.0)
Basophils Absolute: 0 10*3/uL (ref 0.0–0.1)
EOS%: 6 % (ref 0.0–7.0)
Eosinophils Absolute: 0.2 10*3/uL (ref 0.0–0.5)
HCT: 31.7 % — ABNORMAL LOW (ref 34.8–46.6)
HGB: 10.2 g/dL — ABNORMAL LOW (ref 11.6–15.9)
LYMPH#: 0.7 10*3/uL — AB (ref 0.9–3.3)
LYMPH%: 18.4 % (ref 14.0–49.7)
MCH: 31.2 pg (ref 25.1–34.0)
MCHC: 32.2 g/dL (ref 31.5–36.0)
MCV: 96.9 fL (ref 79.5–101.0)
MONO#: 0.3 10*3/uL (ref 0.1–0.9)
MONO%: 9.3 % (ref 0.0–14.0)
NEUT%: 65.8 % (ref 38.4–76.8)
NEUTROS ABS: 2.4 10*3/uL (ref 1.5–6.5)
PLATELETS: 218 10*3/uL (ref 145–400)
RBC: 3.27 10*6/uL — AB (ref 3.70–5.45)
RDW: 18.6 % — ABNORMAL HIGH (ref 11.2–14.5)
WBC: 3.7 10*3/uL — AB (ref 3.9–10.3)

## 2015-12-31 LAB — FERRITIN: FERRITIN: 45 ng/mL (ref 9–269)

## 2015-12-31 NOTE — Telephone Encounter (Signed)
Gave patient dtr avs report and appointments for September.  °

## 2015-12-31 NOTE — Progress Notes (Signed)
[facility]  Telephone:([phone]) [phone] Fax:(336) [phone]  Clinic Follow Up Note   Patient Care Team: Hulan Fess, MD as PCP - General (Family Medicine) [name], MD as Consulting Physician (Hematology) Arta Silence, MD as Consulting Physician (Gastroenterology) 12/31/2015  CHIEF COMPLAINTS:  Follow up unresectable pancreatic adenocarcinoma    OTHER RELATED ISSUES 1. Left LE DVT diagnosed on 06/09/2015, started on lovenox and bridged to coumadin, which was held on 10/22/2068 due to GI bleeding. Changed to Xarelto on 10/31/2015.   Oncology History   Pancreatic cancer [facility])   Staging form: Pancreas, AJCC 7th Edition     Clinical stage from 05/27/2014: Stage IIB (T2, N1, M0) - Signed by [name], MD on 05/31/2015       Carcinoma of body of pancreas ([place])   05/16/2015 Imaging    CT chest, abdomen and pelvis with contrast showed a bulky mass at pancreatic neck/body, tumor encases the celiac and proximal branches and a cruise the main portal vein, peripancreatic adenopathy. no distant mets.       05/28/2015 Initial Biopsy    Pancreatic mass needle biopsy showed well differentiated adenocarcinoma      05/31/2015 Initial Diagnosis    Pancreatic cancer ([place])      05/31/2015 Tumor Marker    CA19.9 =375      06/05/2015 - 07/03/2015 Chemotherapy    weekly gemcitabine 1067m/m2, dose reduced to 8524mm2 on week 2 due to tolerance issue      08/27/2015 - 10/30/2015 Chemotherapy    xeloda 100087mid, 14 days on, 7 days off, stopped due to disease progression       11/22/2015 - 12/05/2015 Radiation Therapy    Radiation to her pancreatic cancer        HISTORY OF PRESENTING ILLNESS:  Nicole Sherman 82. female is here because of her recent abnormal CT scan, which showed a pink vaginal mass, highly suspicious for pancreatic cancer.  She has been haivng epigastric pain after meal for 4-5 months. She also reports left side pain at night when she sleeps on left side, mild  back pain, the pain is worse lately, lasts longer especially afte rdinner, it about 5/10, appetite is lower than before, she lost aobut 10-20lbs in the past 5 months. No other complains, she had loose BM 1-2 time BM for 8-10 months, no hematochezia or melena. She was evaluated by her primary care physician Dr. LitRex Krasbdominal ultrasound was negative on 05/04/2015. She underwent a CT abdomen and pelvis with contrast on 05/16/2015, which showed a 4 x 1 cm mass in pancreatic body and a neck, tumor encases the distal celiac and proximal branches and occludes the main portal vein. No distant metastasis on the CT scan. She was referred to us [facility] further workup.  She has good energy level, she is able to do all house work. She has intermittent dizziness from blood pressure meds. No other complains.   CURRENT THERAPY: supportive care   INTERIM HISTORY: Nicole Sherman for follow up as scheduled. She has completed radiation on 12/05/2015 and tolerated well overall. She had some diarrhea last week, resolved now. Mild constipation, takes laxities. Her appetite is low, but able to eat OK overall. She remains moderately active, still goes out for dinner sometime.   MEDICAL HISTORY:  Past Medical History:  Diagnosis Date  . Arthritis    osteoarthritis-hands wrist  . [place]  pancreatic mass- dx. adenocarcinoma" CT abdomen 05-16-15  . [place] complication [place] .  Diverticulosis    showing on CT of abdomen 05-16-15  . Hypertension   . Hypothyroidism     SURGICAL HISTORY: Past Surgical History:  Procedure Laterality Date  . ANKLE SURGERY Right   . APPENDECTOMY     open  . CATARACT EXTRACTION Right   . EUS N/A 05/28/2015   Procedure: UPPER ENDOSCOPIC ULTRASOUND (EUS) LINEAR;  Surgeon: Arta Silence, MD;  Location: [facility] END[facility]COPY;  Service: Endoscopy;  Laterality: N/A;  . EYE SURGERY     macular hole surgery repair  . JOINT REPLACEMENT Right   . SHOULDER ARTHROSCOPY W/ ROTATOR CUFF  REPAIR Left   . SHOULDER OPEN ROTATOR CUFF REPAIR Right   . TUBAL LIGATION      SOCIAL HISTORY: Social History   Social History  . Marital Status: Married    Spouse Name: N/A  . Number of Children: 2 daughters   . Years of Education: N/A   Occupational History  . Not on file.   Social History Main Topics  . Smoking status: Former Smoker -- 1.00 packs/day for 30 years    Types: Cigarettes    Quit date: 05/21/1986  . Smokeless tobacco: Not on file  . Alcohol Use: No  . Drug Use: No  . Sexual Activity: Not on file   Other Topics Concern  . Not on file   Social History Narrative    FAMILY HISTORY: Family History  Problem Relation Age of Onset  . Stroke Mother   . Cancer Sister     lung cancer     ALLERGIES:  is allergic to keflex [cephalexin] and aspirin.  MEDICATIONS:  Current Outpatient Prescriptions  Medication Sig Dispense Refill  . docusate sodium (COLACE) 100 MG capsule Take 100 mg by mouth 3 (three) times daily.    Marland Kitchen HYDROcodone-acetaminophen (NORCO/VICODIN) 5-325 MG tablet Take 1-2 tablets by mouth every 4 (four) hours as needed for moderate pain. 90 tablet 0  . levothyroxine (SYNTHROID, LEVOTHROID) 100 MCG tablet Take 100 mcg by mouth daily before breakfast.     . magnesium citrate SOLN Take 1 Bottle by mouth once.    . metFORMIN (GLUCOPHAGE) 1000 MG tablet Take 1,000 mg by mouth 2 (two) times daily with a meal.     . omeprazole (PRILOSEC OTC) 20 MG tablet Take 20 mg by mouth daily as needed (acid reflux). Reported on 11/23/2015    . ondansetron (ZOFRAN) 8 MG tablet TAKE ONE TABLET BY MOUTH TWICE DAILY AS NEEDED FOR NAUSEA AND VOMITING 30 tablet 0  . prochlorperazine (COMPAZINE) 5 MG tablet Take 1-2 tablets (5-10 mg total) by mouth every 8 (eight) hours as needed for nausea or vomiting. 30 tablet 2   No current facility-administered medications for this visit.     REVIEW OF SYSTEMS:   Constitutional: Denies fevers, chills or abnormal night sweats Eyes:  Denies blurriness of vision, double vision or watery eyes Ears, nose, mouth, throat, and face: Denies mucositis or sore throat Respiratory: Denies cough, dyspnea or wheezes Cardiovascular: Denies palpitation, chest discomfort or lower extremity swelling Gastrointestinal:  Denies nausea, heartburn or change in bowel habits Skin: Denies abnormal skin rashes Lymphatics: Denies new lymphadenopathy or easy bruising Neurological:Denies numbness, tingling or new weaknesses Behavioral/Psych: Mood is stable, no new changes  All other systems were reviewed with the patient and are negative.  PHYSICAL EXAMINATION: ECOG PERFORMANCE STATUS: 1-2  Vitals:   12/31/15 1512  BP: (!) 159/80  Pulse: 81  Resp: 16  Temp: 98.2 F (36.8 C)   Filed  Weights   12/31/15 1512  Weight: 138 lb 14.4 oz (63 kg)    GENERAL:alert, no distress and comfortable SKIN: skin color, texture, turgor are normal, mild scatter skin rashes involving both breasts and upper agdomen EYES: normal, conjunctiva are pink and non-injected, sclera clear OROPHARYNX:no exudate, no erythema and lips, buccal mucosa, and tongue normal  NECK: supple, thyroid normal size, non-tender, without nodularity LYMPH:  no palpable lymphadenopathy in the cervical, axillary or inguinal LUNGS: clear to auscultation and percussion with normal breathing effort HEART: regular rate & rhythm and no murmurs and no lower extremity edema ABDOMEN:abdomen soft, non-tender and normal bowel sounds Musculoskeletal:no cyanosis of digits and no clubbing  PSYCH: alert & oriented x 3 with fluent speech NEURO: no focal motor/sensory deficits  LABORATORY DATA:  I have reviewed the data as listed  CBC Latest Ref Rng & Units 12/31/2015 11/19/2015 11/16/2015  WBC 3.9 - 10.3 10e3/uL 3.7(L) 4.9 4.8  Hemoglobin 11.6 - 15.9 g/dL 10.2(L) 10.7(L) 8.0(L)  Hematocrit 34.8 - 46.6 % 31.7(L) 32.9(L) 24.9(L)  Platelets 145 - 400 10e3/uL 218 217 241   CMP Latest Ref Rng & Units  12/31/2015 11/19/2015 11/16/2015  Glucose 70 - 140 mg/dl 134 180(H) 154(H)  BUN 7.0 - 26.0 mg/dL 19.7 16.7 21.7  Creatinine 0.6 - 1.1 mg/dL 0.8 0.9 0.9  Sodium 136 - 145 mEq/L 140 140 141  Potassium 3.5 - 5.1 mEq/L 4.0 3.6 3.7  Chloride 101 - 111 mmol/L - - -  CO2 22 - 29 mEq/L 26 23 22   Calcium 8.4 - 10.4 mg/dL 8.4 8.5 8.3(L)  Total Protein 6.4 - 8.3 g/dL 5.7(L) 6.0(L) 5.6(L)  Total Bilirubin 0.20 - 1.20 mg/dL <0.30 0.37 0.39  Alkaline Phos 40 - 150 U/L 87 99 106  AST 5 - 34 U/L 16 11 12   ALT 0 - 55 U/L <9 <9 <9   PT/INR today: 22.8/1.9   CA19.9 u/ML 05/31/2015: 751 07/03/2015: 227 07/19/2015: 159 08/23/2015: 304 09/03/2015: 309 10/15/2015: 357  PATHOLOGY REPORT  Diagnosis 05/28/2015 FINE NEEDLE ASPIRATION: NEEDLE ASPIRATION, PANCREAS BODY (SPECIMEN 1 OF 1 COLLECTED 05/28/15): WELL DIFFERENTIATED ADENOCARCINOMA. Preliminary Diagnosis Intraoperative Diagnosis: Adequate (JDP)   RADIOGRAPHIC STUDIES: I have personally reviewed the radiological images as listed and agreed with the findings in the report. No results found.  ASSESSMENT & PLAN:  82 year old Caucasian female, presented with intermittent abdominal pain, weight loss, and a CT finding of a pancreatic mass.  1. Pancreatic body/head adenocarcinoma, well differentiated, cT2N1M0, stage IIB, unresectable -I previously reviewed her CT chest results, which was negative for metastatic disease. -The biopsy results was reviewed with her and her family members in detail. -Her case was discussed in our GI tumor Board yesterday, Dr. Barry Dienes feels this is not resectable disease. -I reviewed the nature history of pancreatic cancer, which is aggressive. Her disease is incurable at this stage, and the goal of therapy is palliative and prolong her life. -she tolerated first line chemo gemcitabine poorly, with diarrhea, poor appetite, and skin rash, and was switched to second line Xeloda. - I reviewed her restaging CT scan from 6/26, which  unfortunately showed disease progression in pancreas, no other metastasis.  -She has completed radiation -I will request MSI test on her tumor tissue to see if she is a candidate for immunotherapy -continue supportive care   2. Left LE DVT -Probably provoked by her underlying malignancy and chemotherapy -I recommend anticoagulation indefinitely, giving her incurable malignancy, if no contraindications such as bleeding. -She has had recurrent GI bleeding,  Xarelto has been stopped    3. Fatigue, anorexia, and abdominal pain -improved some, she'll continue tramadol  and Vicodin as needed.   4.HTN, DM, hypothyroidism  -follow up with PCP   Plan -I called path to do MSI on her tumor tissue -I will see her back in 4 weeks for follow up  All questions were answered. The patient knows to call the clinic with any problems, questions or concerns.  I spent 20 minutes counseling the patient face to face. The total time spent in the appointment was 25 minutes and more than 50% was on counseling.    [name], MD 12/31/2015

## 2016-01-01 LAB — CANCER ANTIGEN 19-9: CAN 19-9: 121 U/mL — AB (ref 0–35)

## 2016-01-05 ENCOUNTER — Encounter: Payer: Self-pay | Admitting: Hematology

## 2016-01-08 ENCOUNTER — Encounter: Payer: Self-pay | Admitting: Radiation Oncology

## 2016-01-08 ENCOUNTER — Ambulatory Visit
Admission: RE | Admit: 2016-01-08 | Discharge: 2016-01-08 | Disposition: A | Discharge status: Home / Self Care | Payer: Medicare Other | Source: Ambulatory Visit | Attending: Radiation Oncology | Admitting: Radiation Oncology

## 2016-01-08 DIAGNOSIS — C250 Malignant neoplasm of head of pancreas: Secondary | ICD-10-CM | POA: Diagnosis not present

## 2016-01-08 DIAGNOSIS — C251 Malignant neoplasm of body of pancreas: Secondary | ICD-10-CM

## 2016-01-08 NOTE — Progress Notes (Signed)
  Radiation Oncology         (336) [phone] ________________________________  Name: Nicole Sherman MRN: [id number]  Date: 01/08/2016  DOB: 1934-05-01  Post Treatment Note  CC: Gennette Pac, MD  [name], MD  Diagnosis:   Stage IIB, cT2N1M0, unresectable well differentiated adenocarcinoma of the pancreatic body/head.  Interval Since Last Radiation:  4 weeks   11/21/15 - 12/05/15: 30 Gy in 10 fractions to the abdomen-pancreas.  Narrative:  The patient returns today for routine follow-up.  The patient tolerated radiotherapy well with her only complaints during treatment being nausea and constipation.                            On review of systems, the patient states she feels great. She denies any skin concerns, nausea, vomiting or abdominal pain. She denies any constipation and for the last week has not required any cathartics. She denies any fevers, chills, or chest pain, or shortness of breath. No other complaints are noted.  ALLERGIES:  is allergic to keflex [cephalexin] and aspirin.  Meds: Current Outpatient Prescriptions  Medication Sig Dispense Refill  . docusate sodium (COLACE) 100 MG capsule Take 100 mg by mouth 3 (three) times daily.    Marland Kitchen HYDROcodone-acetaminophen (NORCO/VICODIN) 5-325 MG tablet Take 1-2 tablets by mouth every 4 (four) hours as needed for moderate pain. 90 tablet 0  . ondansetron (ZOFRAN) 8 MG tablet TAKE ONE TABLET BY MOUTH TWICE DAILY AS NEEDED FOR NAUSEA AND VOMITING 30 tablet 0  . prochlorperazine (COMPAZINE) 5 MG tablet Take 1-2 tablets (5-10 mg total) by mouth every 8 (eight) hours as needed for nausea or vomiting. 30 tablet 2  . levothyroxine (SYNTHROID, LEVOTHROID) 100 MCG tablet Take 100 mcg by mouth daily before breakfast.     . metFORMIN (GLUCOPHAGE) 1000 MG tablet Take 1,000 mg by mouth 2 (two) times daily with a meal.     . omeprazole (PRILOSEC OTC) 20 MG tablet Take 20 mg by mouth daily as needed (acid reflux). Reported on 11/23/2015     No  current facility-administered medications for this encounter.     Physical Findings:  height is 5' 6.5" (1.689 m) and weight is 138 lb (62.6 kg). Her oral temperature is 97.7 F (36.5 C). Her blood pressure is 133/81 and her pulse is 67. Her oxygen saturation is 99%.  In general this is a well appearing Caucasian female in no acute distress. She's alert and oriented x4 and appropriate throughout the examination. Cardiopulmonary assessment is negative for acute distress and she exhibits normal effort. Her abdomen reveals no evidence of desquamation, and is soft, nontender, nondistended.   Lab Findings: Lab Results  Component Value Date   WBC 3.7 (L) 12/31/2015   HGB 10.2 (L) 12/31/2015   HCT 31.7 (L) 12/31/2015   MCV 96.9 12/31/2015   PLT 218 12/31/2015     Radiographic Findings: No results found.  Impression/Plan: 1. Stage IIB, cT2N1M0, unresectable well differentiated adenocarcinoma of the pancreatic body/head. The patient appears clinically to be doing well and better than she felt during treatment. She will follow up with Dr. Burr Medico to determine if she is a candidate for immunotherapy versus other systemic options of chemotherapy. We would be happy to see her back in the future if she needs additional treatment of her pancreatic disease, otherwise we will plan follow up on an as needed basis.      Carola Rhine, PAC

## 2016-01-08 NOTE — Progress Notes (Addendum)
Nicole Sherman here for reassessment S/P XRT t the pancrease.  She reports decrease in N&V.  Reports napping during the day.  She reports that she has a good appetite and can eat anything she wants, but eats frequent small meals vs 3 sit down meals, which decreases abdominal discomfort.  No constipation at this time.   BP 133/81 (BP Location: Right Arm, Patient Position: Sitting, Cuff Size: Normal)   Pulse 67   Temp 97.7 F (36.5 C) (Oral)   Ht 5' 6.5" (1.689 m)   Wt 138 lb (62.6 kg)   SpO2 99%   BMI 21.94 kg/m     Wt Readings from Last 3 Encounters:  01/08/16 138 lb (62.6 kg)  12/31/15 138 lb 14.4 oz (63 kg)  12/05/15 137 lb (62.1 kg)

## 2016-01-14 ENCOUNTER — Telehealth: Payer: Self-pay | Admitting: *Deleted

## 2016-01-14 NOTE — Telephone Encounter (Signed)
Received vm call from pt asking if it is OK for her to get a flu shot at Dr Eddie Dibbles or her pharmacy.  Message to Dr Burr Medico.

## 2016-01-14 NOTE — Telephone Encounter (Signed)
Called pt & informed OK to get flu shot wherever is convenient for her per Dr Burr Medico.  She reports not having a good day today & has a runny nose.  Suggested not getting flu shot if she has a cold & waiting til she feels better.

## 2016-01-17 ENCOUNTER — Telehealth: Payer: Self-pay | Admitting: *Deleted

## 2016-01-17 DIAGNOSIS — C259 Malignant neoplasm of pancreas, unspecified: Secondary | ICD-10-CM

## 2016-01-17 MED ORDER — HYDROCODONE-ACETAMINOPHEN 5-325 MG PO TABS: 1.0000 | ORAL_TABLET | ORAL | 0 refills | Status: DC | PRN

## 2016-01-17 NOTE — Telephone Encounter (Signed)
Received call from pt's daughter requesting refill on her mother's vicodin.  Informed that we would call when ready.

## 2016-01-18 MED FILL — HYDROCODON-APAP 5-325: 5-325 | 8 days supply | Qty: 90 | Fill #0

## 2016-01-30 ENCOUNTER — Encounter: Payer: Self-pay | Admitting: Hematology

## 2016-01-30 ENCOUNTER — Other Ambulatory Visit (HOSPITAL_BASED_OUTPATIENT_CLINIC_OR_DEPARTMENT_OTHER): Payer: Medicare Other

## 2016-01-30 ENCOUNTER — Ambulatory Visit (HOSPITAL_BASED_OUTPATIENT_CLINIC_OR_DEPARTMENT_OTHER): Payer: Medicare Other | Admitting: Hematology

## 2016-01-30 VITALS — BP 154/81 | HR 82 | Temp 98.9°F | Resp 18 | Ht 66.5 in | Wt 137.4 lb

## 2016-01-30 DIAGNOSIS — D630 Anemia in neoplastic disease: Secondary | ICD-10-CM | POA: Diagnosis not present

## 2016-01-30 DIAGNOSIS — C251 Malignant neoplasm of body of pancreas: Secondary | ICD-10-CM | POA: Diagnosis not present

## 2016-01-30 DIAGNOSIS — I824Z2 Acute embolism and thrombosis of unspecified deep veins of left distal lower extremity: Secondary | ICD-10-CM

## 2016-01-30 DIAGNOSIS — E119 Type 2 diabetes mellitus without complications: Secondary | ICD-10-CM | POA: Diagnosis not present

## 2016-01-30 DIAGNOSIS — I10 Essential (primary) hypertension: Secondary | ICD-10-CM

## 2016-01-30 DIAGNOSIS — C250 Malignant neoplasm of head of pancreas: Secondary | ICD-10-CM

## 2016-01-30 DIAGNOSIS — E039 Hypothyroidism, unspecified: Secondary | ICD-10-CM

## 2016-01-30 LAB — COMPREHENSIVE METABOLIC PANEL
ALT: 11 U/L (ref 0–55)
ANION GAP: 8 meq/L (ref 3–11)
AST: 19 U/L (ref 5–34)
Albumin: 2.5 g/dL — ABNORMAL LOW (ref 3.5–5.0)
Alkaline Phosphatase: 82 U/L (ref 40–150)
BUN: 16.9 mg/dL (ref 7.0–26.0)
CALCIUM: 8.7 mg/dL (ref 8.4–10.4)
CO2: 24 mEq/L (ref 22–29)
CREATININE: 0.9 mg/dL (ref 0.6–1.1)
Chloride: 107 mEq/L (ref 98–109)
EGFR: 57 mL/min/{1.73_m2} — ABNORMAL LOW (ref >90)
Glucose: 187 mg/dl — ABNORMAL HIGH (ref 70–140)
Potassium: 4.7 mEq/L (ref 3.5–5.1)
Sodium: 139 mEq/L (ref 136–145)
TOTAL PROTEIN: 6.2 g/dL — AB (ref 6.4–8.3)

## 2016-01-30 LAB — CBC WITH DIFFERENTIAL/PLATELET
BASO%: 1.4 % (ref 0.0–2.0)
Basophils Absolute: 0.1 10*3/uL (ref 0.0–0.1)
EOS%: 3.2 % (ref 0.0–7.0)
Eosinophils Absolute: 0.1 10*3/uL (ref 0.0–0.5)
HEMATOCRIT: 29.4 % — AB (ref 34.8–46.6)
HEMOGLOBIN: 9.6 g/dL — AB (ref 11.6–15.9)
LYMPH#: 0.6 10*3/uL — AB (ref 0.9–3.3)
LYMPH%: 15 % (ref 14.0–49.7)
MCH: 31.8 pg (ref 25.1–34.0)
MCHC: 32.7 g/dL (ref 31.5–36.0)
MCV: 97.3 fL (ref 79.5–101.0)
MONO#: 0.4 10*3/uL (ref 0.1–0.9)
MONO%: 10.7 % (ref 0.0–14.0)
NEUT%: 69.7 % (ref 38.4–76.8)
NEUTROS ABS: 2.9 10*3/uL (ref 1.5–6.5)
PLATELETS: 226 10*3/uL (ref 145–400)
RBC: 3.03 10*6/uL — ABNORMAL LOW (ref 3.70–5.45)
RDW: 17.4 % — AB (ref 11.2–14.5)
WBC: 4.1 10*3/uL (ref 3.9–10.3)

## 2016-01-30 LAB — FERRITIN: FERRITIN: 29 ng/mL (ref 9–269)

## 2016-01-30 NOTE — Progress Notes (Signed)
[place]  [phone] [phone]) [phone]  Clinic Follow Up Note   Patient Care Team: Hulan Fess, MD as PCP - General (Family Medicine) [name], MD as Consulting Physician (Hematology) Arta Silence, MD as Consulting Physician (Gastroenterology) 01/30/2016  CHIEF COMPLAINTS:  Follow up unresectable pancreatic adenocarcinoma    OTHER RELATED ISSUES 1. Left LE DVT diagnosed on 06/09/2015, started on lovenox and bridged to coumadin, which was held on 10/22/2068 due to GI bleeding. Changed to Xarelto on 10/31/2015, held due to GI bleeding   Oncology History   Pancreatic cancer [facility])   Staging form: Pancreas, AJCC 7th Edition     Clinical stage from 05/27/2014: Stage IIB (T2, N1, M0) - Signed by [name], MD on 05/31/2015       Carcinoma of body of pancreas ([place])   05/16/2015 Imaging    CT chest, abdomen and pelvis with contrast showed a bulky mass at pancreatic neck/body, tumor encases the celiac and proximal branches and a cruise the main portal vein, peripancreatic adenopathy. no distant mets.       05/28/2015 Initial Biopsy    Pancreatic mass needle biopsy showed well differentiated adenocarcinoma      05/31/2015 Initial Diagnosis    Pancreatic cancer ([place])      05/31/2015 Tumor Marker    CA19.9 =375      06/05/2015 - 07/03/2015 Chemotherapy    weekly gemcitabine 1072m/m2, dose reduced to 8573mm2 on week 2 due to tolerance issue      08/27/2015 - 10/30/2015 Chemotherapy    xeloda 100042mid, 14 days on, 7 days off, stopped due to disease progression       11/22/2015 - 12/05/2015 Radiation Therapy    Radiation to her pancreatic cancer        HISTORY OF PRESENTING ILLNESS:  Nicole Sherman 77o. female is here because of her recent abnormal CT scan, which showed a pink vaginal mass, highly suspicious for pancreatic cancer.  She has been haivng epigastric pain after meal for 4-5 months. She also reports left side pain at night when she  sleeps on left side, mild back pain, the pain is worse lately, lasts longer especially afte rdinner, it about 5/10, appetite is lower than before, she lost aobut 10-20lbs in the past 5 months. No other complains, she had loose BM 1-2 time BM for 8-10 months, no hematochezia or melena. She was evaluated by her primary care physician Dr. LitRex Krasbdominal ultrasound was negative on 05/04/2015. She underwent a CT abdomen and pelvis with contrast on 05/16/2015, which showed a 4 x 1 cm mass in pancreatic body and a neck, tumor encases the distal celiac and proximal branches and occludes the main portal vein. No distant metastasis on the CT scan. She was referred to us [place] further workup.  She has good energy level, she is able to do all house work. She has intermittent dizziness from blood pressure meds. No other complains.   CURRENT THERAPY: supportive care   INTERIM HISTORY: SueAlekhyaturns for follow up as scheduled. She is doing moderately well at home. She still has intermittent epigastric pain, for which she takes Vicodin 2-5 tablets a day. She denies any significant nausea or vomiting. Her appetite is moderate, he eats relatively well, her weight is stable. She has loose to soft bowel movement, once to twice a day, no hematochezia. She is able to function moderately well at home   MEDICAL HISTORY:  Past Medical History:  Diagnosis Date  .  Arthritis    osteoarthritis-hands wrist  . Cancer ([place])    pancreatic mass- dx. adenocarcinoma" CT abdomen 05-16-15  . Diabetes mellitus without complication ([place])   . Diverticulosis    showing on CT of abdomen 05-16-15  . Hypertension   . Hypothyroidism     SURGICAL HISTORY: Past Surgical History:  Procedure Laterality Date  . ANKLE SURGERY Right   . APPENDECTOMY     open  . CATARACT EXTRACTION Right   . EUS N/A 05/28/2015   Procedure: UPPER ENDOSCOPIC ULTRASOUND (EUS) LINEAR;  Surgeon: Arta Silence, MD;  Location: [facility];  Service: Endoscopy;   Laterality: N/A;  . EYE SURGERY     macular hole surgery repair  . JOINT REPLACEMENT Right   . SHOULDER ARTHROSCOPY W/ ROTATOR CUFF REPAIR Left   . SHOULDER OPEN ROTATOR CUFF REPAIR Right   . TUBAL LIGATION      SOCIAL HISTORY: Social History   Social History  . Marital Status: Married    Spouse Name: N/A  . Number of Children: 2 daughters   . Years of Education: N/A   Occupational History  . Not on file.   Social History Main Topics  . Smoking status: Former Smoker -- 1.00 packs/day for 30 years    Types: Cigarettes    Quit date: 05/21/1986  . Smokeless tobacco: Not on file  . Alcohol Use: No  . Drug Use: No  . Sexual Activity: Not on file   Other Topics Concern  . Not on file   Social History Narrative    FAMILY HISTORY: Family History  Problem Relation Age of Onset  . Stroke Mother   . Cancer Sister     lung cancer     ALLERGIES:  is allergic to keflex [cephalexin] and aspirin.  MEDICATIONS:  Current Outpatient Prescriptions  Medication Sig Dispense Refill  . docusate sodium (COLACE) 100 MG capsule Take 100 mg by mouth 3 (three) times daily.    Marland Kitchen HYDROcodone-acetaminophen (NORCO/VICODIN) 5-325 MG tablet Take 1-2 tablets by mouth every 4 (four) hours as needed for moderate pain. 90 tablet 0  . levothyroxine (SYNTHROID, LEVOTHROID) 100 MCG tablet Take 100 mcg by mouth daily before breakfast.     . metFORMIN (GLUCOPHAGE) 1000 MG tablet Take 1,000 mg by mouth 2 (two) times daily with a meal.     . omeprazole (PRILOSEC OTC) 20 MG tablet Take 20 mg by mouth daily as needed (acid reflux). Reported on 11/23/2015    . ondansetron (ZOFRAN) 8 MG tablet TAKE ONE TABLET BY MOUTH TWICE DAILY AS NEEDED FOR NAUSEA AND VOMITING 30 tablet 0  . prochlorperazine (COMPAZINE) 5 MG tablet Take 1-2 tablets (5-10 mg total) by mouth every 8 (eight) hours as needed for nausea or vomiting. 30 tablet 2   No current facility-administered medications for this visit.     REVIEW OF  SYSTEMS:   Constitutional: Denies fevers, chills or abnormal night sweats Eyes: Denies blurriness of vision, double vision or watery eyes Ears, nose, mouth, throat, and face: Denies mucositis or sore throat Respiratory: Denies cough, dyspnea or wheezes Cardiovascular: Denies palpitation, chest discomfort or lower extremity swelling Gastrointestinal:  Denies nausea, heartburn or change in bowel habits Skin: Denies abnormal skin rashes Lymphatics: Denies new lymphadenopathy or easy bruising Neurological:Denies numbness, tingling or new weaknesses Behavioral/Psych: Mood is stable, no new changes  All other systems were reviewed with the patient and are negative.  PHYSICAL EXAMINATION: ECOG PERFORMANCE STATUS: 1-2  Vitals:   01/30/16 1256  BP: Marland Kitchen)  154/81  Pulse: 82  Resp: 18  Temp: 98.9 F (37.2 C)   Filed Weights   01/30/16 1256  Weight: 137 lb 6.4 oz (62.3 kg)    GENERAL:alert, no distress and comfortable SKIN: skin color, texture, turgor are normal, mild scatter skin rashes involving both breasts and upper agdomen EYES: normal, conjunctiva are pink and non-injected, sclera clear OROPHARYNX:no exudate, no erythema and lips, buccal mucosa, and tongue normal  NECK: supple, thyroid normal size, non-tender, without nodularity LYMPH:  no palpable lymphadenopathy in the cervical, axillary or inguinal LUNGS: clear to auscultation and percussion with normal breathing effort HEART: regular rate & rhythm and no murmurs and no lower extremity edema ABDOMEN:abdomen soft, non-tender and normal bowel sounds Musculoskeletal:no cyanosis of digits and no clubbing  PSYCH: alert & oriented x 3 with fluent speech NEURO: no focal motor/sensory deficits  LABORATORY DATA:  I have reviewed the data as listed  CBC Latest Ref Rng & Units 01/30/2016 12/31/2015 11/19/2015  WBC 3.9 - 10.3 10e3/uL 4.1 3.7(L) 4.9  Hemoglobin 11.6 - 15.9 g/dL 9.6(L) 10.2(L) 10.7(L)  Hematocrit 34.8 - 46.6 % 29.4(L)  31.7(L) 32.9(L)  Platelets 145 - 400 10e3/uL 226 218 217   CMP Latest Ref Rng & Units 01/30/2016 12/31/2015 11/19/2015  Glucose 70 - 140 mg/dl 187(H) 134 180(H)  BUN 7.0 - 26.0 mg/dL 16.9 19.7 16.7  Creatinine 0.6 - 1.1 mg/dL 0.9 0.8 0.9  Sodium 136 - 145 mEq/L 139 140 140  Potassium 3.5 - 5.1 mEq/L 4.7 4.0 3.6  Chloride 101 - 111 mmol/L - - -  CO2 22 - 29 mEq/L 24 26 23   Calcium 8.4 - 10.4 mg/dL 8.7 8.4 8.5  Total Protein 6.4 - 8.3 g/dL 6.2(L) 5.7(L) 6.0(L)  Total Bilirubin 0.20 - 1.20 mg/dL <0.30 <0.30 0.37  Alkaline Phos 40 - 150 U/L 82 87 99  AST 5 - 34 U/L 19 16 11   ALT 0 - 55 U/L 11 <9 <9   PT/INR today: 22.8/1.9   CA19.9 u/ML 05/31/2015: 751 07/03/2015: 227 07/19/2015: 159 08/23/2015: 304 09/03/2015: 309 10/15/2015: 357 12/31/2015: 121  PATHOLOGY REPORT  Diagnosis 05/28/2015 FINE NEEDLE ASPIRATION: NEEDLE ASPIRATION, PANCREAS BODY (SPECIMEN 1 OF 1 COLLECTED 05/28/15): WELL DIFFERENTIATED ADENOCARCINOMA. Preliminary Diagnosis Intraoperative Diagnosis: Adequate (JDP)   RADIOGRAPHIC STUDIES: I have personally reviewed the radiological images as listed and agreed with the findings in the report. No results found.  ASSESSMENT & PLAN:  82 year old Caucasian female, presented with intermittent abdominal pain, weight loss, and a CT finding of a pancreatic mass.  1. Pancreatic body/head adenocarcinoma, well differentiated, cT2N1M0, stage IIB, unresectable -I previously reviewed her CT chest results, which was negative for metastatic disease. -The biopsy results was reviewed with her and her family members in detail. -Her case was discussed in our GI tumor Board yesterday, Dr. Barry Dienes feels this is not resectable disease. -I reviewed the nature history of pancreatic cancer, which is aggressive. Her disease is incurable at this stage, and the goal of therapy is palliative and prolong her life. -she tolerated first line chemo gemcitabine poorly, with diarrhea, poor appetite, and skin  rash, and was switched to second line Xeloda. - I reviewed her restaging CT scan from 6/26, which unfortunately showed disease progression in pancreas, no other metastasis.  -She has completed radiation, CA19.9 has decreased significantly after radiation, indicating good response. -I will request MSI test on her tumor tissue to see if she is a candidate for immunotherapy, unfortunately there was not enough tissue for the test. -We'll  repeat CT scans for restaging in a month. I will consider tissue biopsy if she develops metastasis which are more accessible than pancreatic mass biopsy, to get tumor tissue for MSI testing. -continue supportive care   2. Left LE DVT -Probably provoked by her underlying malignancy and chemotherapy -I recommend anticoagulation indefinitely, giving her incurable malignancy, if no contraindications such as bleeding. -She has had recurrent GI bleeding, Xarelto has been stopped    3. Fatigue, anorexia, and abdominal pain -improved some, she'll continue tramadol  and Vicodin as needed.   4.HTN, DM, hypothyroidism  -follow up with PCP   Plan -continue supportive care -I will see her back in 4 weeks for follow up, with a restaging CT CAP a few days before   All questions were answered. The patient knows to call the clinic with any problems, questions or concerns.  I spent 20 minutes counseling the patient face to face. The total time spent in the appointment was 25 minutes and more than 50% was on counseling.    [name], MD 01/30/2016

## 2016-01-31 LAB — CANCER ANTIGEN 19-9: CAN 19-9: 77 U/mL — AB (ref 0–35)

## 2016-02-05 ENCOUNTER — Encounter: Payer: Self-pay | Admitting: Nurse Practitioner

## 2016-02-05 ENCOUNTER — Telehealth: Payer: Self-pay | Admitting: *Deleted

## 2016-02-05 ENCOUNTER — Other Ambulatory Visit: Payer: Self-pay | Admitting: *Deleted

## 2016-02-05 ENCOUNTER — Telehealth: Payer: Self-pay | Admitting: Hematology

## 2016-02-05 ENCOUNTER — Ambulatory Visit (HOSPITAL_BASED_OUTPATIENT_CLINIC_OR_DEPARTMENT_OTHER): Payer: Medicare Other | Admitting: Nurse Practitioner

## 2016-02-05 ENCOUNTER — Ambulatory Visit (HOSPITAL_BASED_OUTPATIENT_CLINIC_OR_DEPARTMENT_OTHER): Payer: Medicare Other

## 2016-02-05 VITALS — BP 157/82 | HR 74 | Temp 98.6°F | Resp 16 | Wt 137.2 lb

## 2016-02-05 DIAGNOSIS — C251 Malignant neoplasm of body of pancreas: Secondary | ICD-10-CM | POA: Diagnosis not present

## 2016-02-05 DIAGNOSIS — K922 Gastrointestinal hemorrhage, unspecified: Secondary | ICD-10-CM | POA: Diagnosis not present

## 2016-02-05 DIAGNOSIS — C259 Malignant neoplasm of pancreas, unspecified: Secondary | ICD-10-CM

## 2016-02-05 DIAGNOSIS — D62 Acute posthemorrhagic anemia: Secondary | ICD-10-CM

## 2016-02-05 LAB — CBC WITH DIFFERENTIAL/PLATELET
BASO%: 0.4 % (ref 0.0–2.0)
Basophils Absolute: 0 10*3/uL (ref 0.0–0.1)
EOS ABS: 0.1 10*3/uL (ref 0.0–0.5)
EOS%: 3.1 % (ref 0.0–7.0)
HCT: 29.4 % — ABNORMAL LOW (ref 34.8–46.6)
HGB: 9.5 g/dL — ABNORMAL LOW (ref 11.6–15.9)
LYMPH%: 13 % — AB (ref 14.0–49.7)
MCH: 31.5 pg (ref 25.1–34.0)
MCHC: 32.3 g/dL (ref 31.5–36.0)
MCV: 97.4 fL (ref 79.5–101.0)
MONO#: 0.5 10*3/uL (ref 0.1–0.9)
MONO%: 10.3 % (ref 0.0–14.0)
NEUT#: 3.3 10*3/uL (ref 1.5–6.5)
NEUT%: 73.2 % (ref 38.4–76.8)
PLATELETS: 223 10*3/uL (ref 145–400)
RBC: 3.02 10*6/uL — AB (ref 3.70–5.45)
RDW: 16.5 % — ABNORMAL HIGH (ref 11.2–14.5)
WBC: 4.5 10*3/uL (ref 3.9–10.3)
lymph#: 0.6 10*3/uL — ABNORMAL LOW (ref 0.9–3.3)

## 2016-02-05 NOTE — Telephone Encounter (Signed)
NO 10/3 LOS/ORDERS/REFERRALS °

## 2016-02-05 NOTE — Telephone Encounter (Signed)
Received call from pt's daughter, Jackelyn Poling, stating that pt had BM with lots of blood last hs & they called On-call RN & was told ok to wait & call MD this am.  Pt is not on blood thinners now.  She has not had another BM or any bleeding.  Discussed with Dr Burr Medico & instructed to come in for labs @ 9:45 am & see Selena Lesser NP @ 10:15.

## 2016-02-05 NOTE — Assessment & Plan Note (Addendum)
Patient states that she had one episode of bright red bleeding from the rectum with a bowel movement last night.  This morning she had a more formed tarry black stool that was she denies any abdominal pain; but her stomach feels uncomfortable.  She denies any nausea or vomiting.  She denies any recent fevers or chills.  Note: Patient has a history of DVTs in the past; and was taking anticoagulants.  However,-patient developed significant GI bleeds earlier this summer and made the decision to hold any further anticoagulation.  Exam today reveals abdomen soft and nontender.  Bowel sounds positive in all 4 quads.  Labs obtained today revealed a hemoglobin is stable at 9.5.  Previously, hemoglobin was 9.6.  All other vital signs stable and patient was afebrile.  Reviewed all findings with Dr. Burr Medico; and she suggested that patient follow up with her gastroenterologist as soon as possible.  Also advised patient this provider would call Dr. Erlinda Hong office to see if I can obtain an appointment for further evaluation and management.  Advised both patient and her family the patient she should present directly to the emergency department for any continuous or worsening GI/rectal bleeding whatsoever.

## 2016-02-06 ENCOUNTER — Telehealth: Payer: Self-pay | Admitting: Nurse Practitioner

## 2016-02-06 ENCOUNTER — Encounter: Payer: Self-pay | Admitting: Nurse Practitioner

## 2016-02-06 NOTE — Telephone Encounter (Signed)
This provider called Dr.

## 2016-02-06 NOTE — Telephone Encounter (Signed)
This provider called Dr. Erlinda Hong office to request that they schedule an appointment for the patient to be seen for further management of her intermittent, chronic GI bleeding were sent as possible.  The front desk at Dr. Erlinda Hong office stated that they would arrange the appointment and call the patient to inform of the time of for the appointment.

## 2016-02-06 NOTE — Progress Notes (Signed)
SYMPTOM MANAGEMENT CLINIC    Chief Complaint: GI bleed  HPI:  Nicole Sherman 82 y.o. female diagnosed with pancreatic cancer.  Currently undergoing observation only.   Oncology History   Pancreatic cancer [facility])   Staging form: Pancreas, AJCC 7th Edition     Clinical stage from 05/27/2014: Stage IIB (T2, N1, M0) - Signed by [name], MD on 05/31/2015       Carcinoma of body of pancreas ([place])   05/16/2015 Imaging    CT chest, abdomen and pelvis with contrast showed a bulky mass at pancreatic neck/body, tumor encases the celiac and proximal branches and a cruise the main portal vein, peripancreatic adenopathy. no distant mets.       05/28/2015 Initial Biopsy    Pancreatic mass needle biopsy showed well differentiated adenocarcinoma      05/31/2015 Initial Diagnosis    Pancreatic cancer ([place])      05/31/2015 Tumor Marker    CA19.9 =375      06/05/2015 - 07/03/2015 Chemotherapy    weekly gemcitabine 1000mg /m2, dose reduced to 850mg /m2 on week 2 due to tolerance issue      08/27/2015 - 10/30/2015 Chemotherapy    xeloda 1000mg  bid, 14 days on, 7 days off, stopped due to disease progression       11/22/2015 - 12/05/2015 Radiation Therapy    Radiation to her pancreatic cancer        Review of Systems  Constitutional: Positive for malaise/fatigue.  Gastrointestinal: Positive for blood in stool and melena.  All other systems reviewed and are negative.   Past Medical History:  Diagnosis Date  . Arthritis    osteoarthritis-hands wrist  . Cancer ([place])    pancreatic mass- dx. adenocarcinoma" CT abdomen 05-16-15  . Diabetes mellitus without complication ([employer])   . Diverticulosis    showing on CT of abdomen 05-16-15  . Hypertension   . Hypothyroidism     Past Surgical History:  Procedure Laterality Date  . ANKLE SURGERY Right   . APPENDECTOMY     open  . CATARACT EXTRACTION Right   . EUS N/A 05/28/2015   Procedure: UPPER ENDOSCOPIC ULTRASOUND (EUS) LINEAR;  Surgeon:  Arta Silence, MD;  Location: [facility];  Service: Endoscopy;  Laterality: N/A;  . EYE SURGERY     macular hole surgery repair  . JOINT REPLACEMENT Right   . SHOULDER ARTHROSCOPY W/ ROTATOR CUFF REPAIR Left   . SHOULDER OPEN ROTATOR CUFF REPAIR Right   . TUBAL LIGATION      has Carcinoma of body of pancreas ([place]); Acute deep vein thrombosis (DVT) of distal vein of left lower extremity ([place]); Hypothyroidism; Hypertension; Diabetes mellitus without complication ([place]); Lower GI bleeding; and Hypoalbuminemia due to protein-calorie malnutrition ([place]) on her problem list.    is allergic to keflex [cephalexin] and aspirin.    Medication List       Accurate as of 02/05/16 11:59 PM. Always use your most recent med list.          docusate sodium 100 MG capsule Commonly known as:  COLACE Take 100 mg by mouth 3 (three) times daily.   HYDROcodone-acetaminophen 5-325 MG tablet Commonly known as:  NORCO/VICODIN Take 1-2 tablets by mouth every 4 (four) hours as needed for moderate pain.   levothyroxine 100 MCG tablet Commonly known as:  SYNTHROID, LEVOTHROID Take 100 mcg by mouth daily before breakfast.   metFORMIN 1000 MG tablet Commonly known as:  GLUCOPHAGE Take 1,000 mg by mouth 2 (two) times daily  with a meal.   omeprazole 20 MG tablet Commonly known as:  PRILOSEC OTC Take 20 mg by mouth daily as needed (acid reflux). Reported on 11/23/2015   ondansetron 8 MG tablet Commonly known as:  ZOFRAN TAKE ONE TABLET BY MOUTH TWICE DAILY AS NEEDED FOR NAUSEA AND VOMITING   prochlorperazine 5 MG tablet Commonly known as:  COMPAZINE Take 1-2 tablets (5-10 mg total) by mouth every 8 (eight) hours as needed for nausea or vomiting.        PHYSICAL EXAMINATION  Oncology Vitals 02/05/2016 01/30/2016  Height - 169 cm  Weight 62.256 kg 62.324 kg  Weight (lbs) 137 lbs 4 oz 137 lbs 6 oz  BMI (kg/m2) 21.82 kg/m2 21.84 kg/m2  Temp 98.6 98.9  Pulse 74 82  Resp 16 18  SpO2 98 97  BSA  (m2) 1.71 m2 1.71 m2   BP Readings from Last 2 Encounters:  02/05/16 (!) 157/82  01/30/16 (!) 154/81    Physical Exam  Constitutional: She is oriented to person, place, and time and well-developed, well-nourished, and in no distress.  HENT:  Head: Normocephalic and atraumatic.  Mouth/Throat: Oropharynx is clear and moist.  Eyes: Conjunctivae and EOM are normal. Pupils are equal, round, and reactive to light. Right eye exhibits no discharge. Left eye exhibits no discharge. No scleral icterus.  Neck: Normal range of motion. Neck supple. No JVD present. No tracheal deviation present. No thyromegaly present.  Cardiovascular: Normal rate, regular rhythm, normal heart sounds and intact distal pulses.   Pulmonary/Chest: Effort normal and breath sounds normal. No respiratory distress. She has no wheezes. She has no rales. She exhibits no tenderness.  Abdominal: Soft. Bowel sounds are normal. She exhibits no distension and no mass. There is no tenderness. There is no rebound and no guarding.  Musculoskeletal: Normal range of motion. She exhibits no edema or tenderness.  Lymphadenopathy:    She has no cervical adenopathy.  Neurological: She is alert and oriented to person, place, and time. Gait normal.  Skin: Skin is warm and dry. No rash noted. No erythema. No pallor.  Psychiatric: Affect normal.  Nursing note and vitals reviewed.   LABORATORY DATA:. Appointment on 02/05/2016  Component Date Value Ref Range Status  . WBC 02/05/2016 4.5  3.9 - 10.3 10e3/uL Final  . NEUT# 02/05/2016 3.3  1.5 - 6.5 10e3/uL Final  . HGB 02/05/2016 9.5* 11.6 - 15.9 g/dL Final  . HCT 02/05/2016 29.4* 34.8 - 46.6 % Final  . Platelets 02/05/2016 223  145 - 400 10e3/uL Final  . MCV 02/05/2016 97.4  79.5 - 101.0 fL Final  . MCH 02/05/2016 31.5  25.1 - 34.0 pg Final  . MCHC 02/05/2016 32.3  31.5 - 36.0 g/dL Final  . RBC 02/05/2016 3.02* 3.70 - 5.45 10e6/uL Final  . RDW 02/05/2016 16.5* 11.2 - 14.5 % Final  .  lymph# 02/05/2016 0.6* 0.9 - 3.3 10e3/uL Final  . MONO# 02/05/2016 0.5  0.1 - 0.9 10e3/uL Final  . Eosinophils Absolute 02/05/2016 0.1  0.0 - 0.5 10e3/uL Final  . Basophils Absolute 02/05/2016 0.0  0.0 - 0.1 10e3/uL Final  . NEUT% 02/05/2016 73.2  38.4 - 76.8 % Final  . LYMPH% 02/05/2016 13.0* 14.0 - 49.7 % Final  . MONO% 02/05/2016 10.3  0.0 - 14.0 % Final  . EOS% 02/05/2016 3.1  0.0 - 7.0 % Final  . BASO% 02/05/2016 0.4  0.0 - 2.0 % Final    RADIOGRAPHIC STUDIES: No results found.  ASSESSMENT/PLAN:  Lower GI bleeding Patient states that she had one episode of bright red bleeding from the rectum with a bowel movement last night.  This morning she had a more formed tarry black stool that was she denies any abdominal pain; but her stomach feels uncomfortable.  She denies any nausea or vomiting.  She denies any recent fevers or chills.  Note: Patient has a history of DVTs in the past; and was taking anticoagulants.  However,-patient developed significant GI bleeds earlier this summer and made the decision to hold any further anticoagulation.  Exam today reveals abdomen soft and nontender.  Bowel sounds positive in all 4 quads.  Labs obtained today revealed a hemoglobin is stable at 9.5.  Previously, hemoglobin was 9.6.  All other vital signs stable and patient was afebrile.  Reviewed all findings with Dr. Burr Medico; and she suggested that patient follow up with her gastroenterologist as soon as possible.  Also advised patient this provider would call Dr. Erlinda Hong office to see if I can obtain an appointment for further evaluation and management.  Advised both patient and her family the patient she should present directly to the emergency department for any continuous or worsening GI/rectal bleeding whatsoever.  Carcinoma of body of pancreas [facility]) Patient has completed all radiation treatments.  She is currently undergoing observation only.  See further notes for details of today's visit.   Patient is scheduled to return for labs only on 02/29/2016.  She is scheduled for follow-up visit on 03/06/2016.   Patient stated understanding of all instructions; and was in agreement with this plan of care. The patient knows to call the clinic with any problems, questions or concerns.   Total time spent with patient was 25 minutes;  with greater than 75 percent of that time spent in face to face counseling regarding patient's symptoms,  and coordination of care and follow up.  Disclaimer:This dictation was prepared with Dragon/digital dictation along with [employer]. Any transcriptional errors that result from this process are unintentional.  Drue Second, NP 02/06/2016

## 2016-02-06 NOTE — Assessment & Plan Note (Signed)
Patient has completed all radiation treatments.  She is currently undergoing observation only.  See further notes for details of today's visit.  Patient is scheduled to return for labs only on 02/29/2016.  She is scheduled for follow-up visit on 03/06/2016.

## 2016-02-08 ENCOUNTER — Telehealth: Payer: Self-pay | Admitting: Hematology

## 2016-02-08 NOTE — Telephone Encounter (Signed)
Spoke with patient re lab/fu October and November. Central radiology will re scan.

## 2016-02-11 ENCOUNTER — Telehealth: Payer: Self-pay | Admitting: Nurse Practitioner

## 2016-02-11 NOTE — Telephone Encounter (Signed)
Called patient to check in with her.  She states that she has heard from Dr. Erlinda Hong office; and she has an appointment for a consultation with him on Tuesday, 02/19/2016.

## 2016-02-18 ENCOUNTER — Telehealth: Payer: Self-pay | Admitting: Medical Oncology

## 2016-02-18 ENCOUNTER — Other Ambulatory Visit: Payer: Self-pay | Admitting: *Deleted

## 2016-02-18 DIAGNOSIS — C259 Malignant neoplasm of pancreas, unspecified: Secondary | ICD-10-CM

## 2016-02-18 MED ORDER — HYDROCODONE-ACETAMINOPHEN 5-325 MG PO TABS: 1.0000 | ORAL_TABLET | ORAL | 0 refills | Status: DC | PRN

## 2016-02-18 NOTE — Telephone Encounter (Signed)
Spoke with pt and informed pt that script is ready for pt to pick up on Tues.

## 2016-02-18 NOTE — Telephone Encounter (Signed)
Would like to pick up pain med refill tomorrow around 1030

## 2016-02-21 MED FILL — HYDROCODON-APAP 5-325: 5-325 | 8 days supply | Qty: 90 | Fill #0

## 2016-02-28 ENCOUNTER — Encounter (HOSPITAL_COMMUNITY): Payer: Self-pay

## 2016-02-28 ENCOUNTER — Ambulatory Visit (HOSPITAL_COMMUNITY)
Admission: RE | Admit: 2016-02-28 | Discharge: 2016-02-28 | Disposition: A | Discharge status: Home / Self Care | Payer: Medicare Other | Source: Ambulatory Visit | Attending: Hematology | Admitting: Hematology

## 2016-02-28 ENCOUNTER — Other Ambulatory Visit (HOSPITAL_BASED_OUTPATIENT_CLINIC_OR_DEPARTMENT_OTHER): Payer: Medicare Other

## 2016-02-28 DIAGNOSIS — D630 Anemia in neoplastic disease: Secondary | ICD-10-CM

## 2016-02-28 DIAGNOSIS — I2510 Atherosclerotic heart disease of native coronary artery without angina pectoris: Secondary | ICD-10-CM | POA: Diagnosis not present

## 2016-02-28 DIAGNOSIS — I700 Atherosclerosis of aorta: Secondary | ICD-10-CM | POA: Insufficient documentation

## 2016-02-28 DIAGNOSIS — C250 Malignant neoplasm of head of pancreas: Secondary | ICD-10-CM

## 2016-02-28 DIAGNOSIS — I864 Gastric varices: Secondary | ICD-10-CM | POA: Insufficient documentation

## 2016-02-28 DIAGNOSIS — C251 Malignant neoplasm of body of pancreas: Secondary | ICD-10-CM

## 2016-02-28 DIAGNOSIS — R188 Other ascites: Secondary | ICD-10-CM | POA: Insufficient documentation

## 2016-02-28 LAB — CBC WITH DIFFERENTIAL/PLATELET
BASO%: 0.5 % (ref 0.0–2.0)
Basophils Absolute: 0 10*3/uL (ref 0.0–0.1)
EOS%: 2.6 % (ref 0.0–7.0)
Eosinophils Absolute: 0.1 10*3/uL (ref 0.0–0.5)
HEMATOCRIT: 28.7 % — AB (ref 34.8–46.6)
HEMOGLOBIN: 9.3 g/dL — AB (ref 11.6–15.9)
LYMPH#: 0.6 10*3/uL — AB (ref 0.9–3.3)
LYMPH%: 16.5 % (ref 14.0–49.7)
MCH: 31.6 pg (ref 25.1–34.0)
MCHC: 32.4 g/dL (ref 31.5–36.0)
MCV: 97.6 fL (ref 79.5–101.0)
MONO#: 0.4 10*3/uL (ref 0.1–0.9)
MONO%: 11.3 % (ref 0.0–14.0)
NEUT#: 2.7 10*3/uL (ref 1.5–6.5)
NEUT%: 69.1 % (ref 38.4–76.8)
PLATELETS: 205 10*3/uL (ref 145–400)
RBC: 2.94 10*6/uL — ABNORMAL LOW (ref 3.70–5.45)
RDW: 15.5 % — ABNORMAL HIGH (ref 11.2–14.5)
WBC: 3.9 10*3/uL (ref 3.9–10.3)

## 2016-02-28 LAB — COMPREHENSIVE METABOLIC PANEL
ALBUMIN: 3 g/dL — AB (ref 3.5–5.0)
ALT: 8 U/L (ref 0–55)
ANION GAP: 8 meq/L (ref 3–11)
AST: 17 U/L (ref 5–34)
Alkaline Phosphatase: 73 U/L (ref 40–150)
BILIRUBIN TOTAL: 0.28 mg/dL (ref 0.20–1.20)
BUN: 15.4 mg/dL (ref 7.0–26.0)
CALCIUM: 8.9 mg/dL (ref 8.4–10.4)
CO2: 25 mEq/L (ref 22–29)
CREATININE: 0.9 mg/dL (ref 0.6–1.1)
Chloride: 108 mEq/L (ref 98–109)
EGFR: 59 mL/min/{1.73_m2} — ABNORMAL LOW (ref >90)
Glucose: 117 mg/dl (ref 70–140)
Potassium: 3.7 mEq/L (ref 3.5–5.1)
Sodium: 140 mEq/L (ref 136–145)
TOTAL PROTEIN: 6.6 g/dL (ref 6.4–8.3)

## 2016-02-28 LAB — FERRITIN: FERRITIN: 18 ng/mL (ref 9–269)

## 2016-02-28 MED ORDER — IOPAMIDOL (ISOVUE-300) INJECTION 61%
100.0000 mL | Freq: Once | INTRAVENOUS | Status: AC | PRN
  Administered 2016-02-28: 100 mL via INTRAVENOUS

## 2016-02-29 ENCOUNTER — Other Ambulatory Visit: Payer: Medicare Other

## 2016-02-29 LAB — CANCER ANTIGEN 19-9: CAN 19-9: 51 U/mL — AB (ref 0–35)

## 2016-03-06 ENCOUNTER — Telehealth: Payer: Self-pay | Admitting: Hematology

## 2016-03-06 ENCOUNTER — Ambulatory Visit (HOSPITAL_BASED_OUTPATIENT_CLINIC_OR_DEPARTMENT_OTHER): Payer: Medicare Other | Admitting: Hematology

## 2016-03-06 ENCOUNTER — Encounter: Payer: Self-pay | Admitting: Hematology

## 2016-03-06 VITALS — BP 141/72 | HR 78 | Temp 98.2°F | Resp 17 | Ht 66.5 in | Wt 145.0 lb

## 2016-03-06 DIAGNOSIS — E119 Type 2 diabetes mellitus without complications: Secondary | ICD-10-CM

## 2016-03-06 DIAGNOSIS — I824Z2 Acute embolism and thrombosis of unspecified deep veins of left distal lower extremity: Secondary | ICD-10-CM | POA: Diagnosis not present

## 2016-03-06 DIAGNOSIS — E039 Hypothyroidism, unspecified: Secondary | ICD-10-CM

## 2016-03-06 DIAGNOSIS — I10 Essential (primary) hypertension: Secondary | ICD-10-CM

## 2016-03-06 DIAGNOSIS — C251 Malignant neoplasm of body of pancreas: Secondary | ICD-10-CM | POA: Diagnosis not present

## 2016-03-06 NOTE — Progress Notes (Signed)
[place]  Telephone:([phone]) [phone] Fax:(336) [phone]  Clinic Follow Up Note   Patient Care Team: Hulan Fess, MD as PCP - General (Family Medicine) [name], MD as Consulting Physician (Hematology) Arta Silence, MD as Consulting Physician (Gastroenterology) 03/06/2016  CHIEF COMPLAINTS:  Follow up unresectable pancreatic adenocarcinoma    OTHER RELATED ISSUES 1. Left LE DVT diagnosed on 06/09/2015, started on lovenox and bridged to coumadin, which was held on 10/22/2068 due to GI bleeding. Changed to Xarelto on 10/31/2015, held due to GI bleeding   Oncology History   Pancreatic cancer [facility])   Staging form: Pancreas, AJCC 7th Edition     Clinical stage from 05/27/2014: Stage IIB (T2, N1, M0) - Signed by [name], MD on 05/31/2015       Carcinoma of body of pancreas ([place])   05/16/2015 Imaging    CT chest, abdomen and pelvis with contrast showed a bulky mass at pancreatic neck/body, tumor encases the celiac and proximal branches and a cruise the main portal vein, peripancreatic adenopathy. no distant mets.       05/28/2015 Initial Biopsy    Pancreatic mass needle biopsy showed well differentiated adenocarcinoma      05/31/2015 Initial Diagnosis    Pancreatic cancer ([place])      05/31/2015 Tumor Marker    CA19.9 =375      06/05/2015 - 07/03/2015 Chemotherapy    weekly gemcitabine 103m/m2, dose reduced to 8557mm2 on week 2 due to tolerance issue      08/27/2015 - 10/30/2015 Chemotherapy    xeloda 10007mid, 14 days on, 7 days off, stopped due to disease progression       11/22/2015 - 12/05/2015 Radiation Therapy    Radiation to her pancreatic cancer        HISTORY OF PRESENTING ILLNESS:  Nicole Sherman 82o. female is here because of her recent abnormal CT scan, which showed a pink vaginal mass, highly suspicious for pancreatic cancer.  She has been haivng epigastric pain after meal for 4-5 months. She also reports left side pain at night when she  sleeps on left side, mild back pain, the pain is worse lately, lasts longer especially afte rdinner, it about 5/10, appetite is lower than before, she lost aobut 10-20lbs in the past 5 months. No other complains, she had loose BM 1-2 time BM for 8-10 months, no hematochezia or melena. She was evaluated by her primary care physician Dr. LitRex Krasbdominal ultrasound was negative on 05/04/2015. She underwent a CT abdomen and pelvis with contrast on 05/16/2015, which showed a 4 x 1 cm mass in pancreatic body and a neck, tumor encases the distal celiac and proximal branches and occludes the main portal vein. No distant metastasis on the CT scan. She was referred to us [place] further workup.  She has good energy level, she is able to do all house work. She has intermittent dizziness from blood pressure meds. No other complains.   CURRENT THERAPY: supportive care   INTERIM HISTORY: [name] for follow up as scheduled. She is doing slightly better lately. Her epigastric pain has improved some, she only takes Vicodin 1-2 tablets a day. She is able to tolerate the more activities.  Still has moderate fatigue, but functions well at home. She denies any nausea, bloating, or change of her bowel habits.  MEDICAL HISTORY:  Past Medical History:  Diagnosis Date  . Arthritis    osteoarthritis-hands wrist  . Cancer ([facility]  pancreatic mass- dx.  adenocarcinoma" CT abdomen 05-16-15  . Diabetes mellitus without complication ([place])   . Diverticulosis    showing on CT of abdomen 05-16-15  . Hypertension   . Hypothyroidism     SURGICAL HISTORY: Past Surgical History:  Procedure Laterality Date  . ANKLE SURGERY Right   . APPENDECTOMY     open  . CATARACT EXTRACTION Right   . EUS N/A 05/28/2015   Procedure: UPPER ENDOSCOPIC ULTRASOUND (EUS) LINEAR;  Surgeon: Arta Silence, MD;  Location: [facility];  Service: Endoscopy;  Laterality: N/A;  . EYE SURGERY     macular hole surgery repair  . JOINT REPLACEMENT  Right   . SHOULDER ARTHROSCOPY W/ ROTATOR CUFF REPAIR Left   . SHOULDER OPEN ROTATOR CUFF REPAIR Right   . TUBAL LIGATION      SOCIAL HISTORY: Social History   Social History  . Marital Status: Married    Spouse Name: N/A  . Number of Children: 2 daughters   . Years of Education: N/A   Occupational History  . Not on file.   Social History Main Topics  . Smoking status: Former Smoker -- 1.00 packs/day for 30 years    Types: Cigarettes    Quit date: 05/21/1986  . Smokeless tobacco: Not on file  . Alcohol Use: No  . Drug Use: No  . Sexual Activity: Not on file   Other Topics Concern  . Not on file   Social History Narrative    FAMILY HISTORY: Family History  Problem Relation Age of Onset  . Stroke Mother   . Cancer Sister     lung cancer     ALLERGIES:  is allergic to keflex [cephalexin] and aspirin.  MEDICATIONS:  Current Outpatient Prescriptions  Medication Sig Dispense Refill  . docusate sodium (COLACE) 100 MG capsule Take 100 mg by mouth 3 (three) times daily.    Marland Kitchen HYDROcodone-acetaminophen (NORCO/VICODIN) 5-325 MG tablet Take 1-2 tablets by mouth every 4 (four) hours as needed for moderate pain. 90 tablet 0  . levothyroxine (SYNTHROID, LEVOTHROID) 100 MCG tablet Take 100 mcg by mouth daily before breakfast.     . metFORMIN (GLUCOPHAGE) 1000 MG tablet Take 1,000 mg by mouth 2 (two) times daily with a meal.     . omeprazole (PRILOSEC OTC) 20 MG tablet Take 20 mg by mouth daily as needed (acid reflux). Reported on 11/23/2015    . ondansetron (ZOFRAN) 8 MG tablet TAKE ONE TABLET BY MOUTH TWICE DAILY AS NEEDED FOR NAUSEA AND VOMITING 30 tablet 0  . prochlorperazine (COMPAZINE) 5 MG tablet Take 1-2 tablets (5-10 mg total) by mouth every 8 (eight) hours as needed for nausea or vomiting. 30 tablet 2   No current facility-administered medications for this visit.     REVIEW OF SYSTEMS:   Constitutional: Denies fevers, chills or abnormal night sweats Eyes: Denies  blurriness of vision, double vision or watery eyes Ears, nose, mouth, throat, and face: Denies mucositis or sore throat Respiratory: Denies cough, dyspnea or wheezes Cardiovascular: Denies palpitation, chest discomfort or lower extremity swelling Gastrointestinal:  Denies nausea, heartburn or change in bowel habits Skin: Denies abnormal skin rashes Lymphatics: Denies new lymphadenopathy or easy bruising Neurological:Denies numbness, tingling or new weaknesses Behavioral/Psych: Mood is stable, no new changes  All other systems were reviewed with the patient and are negative.  PHYSICAL EXAMINATION: ECOG PERFORMANCE STATUS: 1-2  Vitals:   03/06/16 1315  BP: (!) 141/72  Pulse: 78  Resp: 17  Temp: 98.2 F (36.8 C)  Filed Weights   03/06/16 1315  Weight: 145 lb (65.8 kg)    GENERAL:alert, no distress and comfortable SKIN: skin color, texture, turgor are normal, mild scatter skin rashes involving both breasts and upper agdomen EYES: normal, conjunctiva are pink and non-injected, sclera clear OROPHARYNX:no exudate, no erythema and lips, buccal mucosa, and tongue normal  NECK: supple, thyroid normal size, non-tender, without nodularity LYMPH:  no palpable lymphadenopathy in the cervical, axillary or inguinal LUNGS: clear to auscultation and percussion with normal breathing effort HEART: regular rate & rhythm and no murmurs and no lower extremity edema ABDOMEN:abdomen soft, non-tender and normal bowel sounds Musculoskeletal:no cyanosis of digits and no clubbing  PSYCH: alert & oriented x 3 with fluent speech NEURO: no focal motor/sensory deficits  LABORATORY DATA:  I have reviewed the data as listed  CBC Latest Ref Rng & Units 02/28/2016 02/05/2016 01/30/2016  WBC 3.9 - 10.3 10e3/uL 3.9 4.5 4.1  Hemoglobin 11.6 - 15.9 g/dL 9.3(L) 9.5(L) 9.6(L)  Hematocrit 34.8 - 46.6 % 28.7(L) 29.4(L) 29.4(L)  Platelets 145 - 400 10e3/uL 205 223 226   CMP Latest Ref Rng & Units 02/28/2016  01/30/2016 12/31/2015  Glucose 70 - 140 mg/dl 117 187(H) 134  BUN 7.0 - 26.0 mg/dL 15.4 16.9 19.7  Creatinine 0.6 - 1.1 mg/dL 0.9 0.9 0.8  Sodium 136 - 145 mEq/L 140 139 140  Potassium 3.5 - 5.1 mEq/L 3.7 4.7 4.0  Chloride 101 - 111 mmol/L - - -  CO2 22 - 29 mEq/L _0 Calcium 8.4 - 10.4 mg/dL 8.9 8.7 8.4  Total Protein 6.4 - 8.3 g/dL 6.6 6.2(L) 5.7(L)  Total Bilirubin 0.20 - 1.20 mg/dL 0.28 <0.30 <0.30  Alkaline Phos 40 - 150 U/L 73 82 87  AST 5 - 34 U/L _1 ALT 0 - 55 U/L 8 11 <9   PT/INR today: 22.8/1.9   CA19.9 u/ML 05/31/2015: 751 07/03/2015: 227 07/19/2015: 159 08/23/2015: 304 09/03/2015: 309 10/15/2015: 357 12/31/2015: 121 01/30/2016: 77 02/28/2016: 51  PATHOLOGY REPORT  Diagnosis 05/28/2015 FINE NEEDLE ASPIRATION: NEEDLE ASPIRATION, PANCREAS BODY (SPECIMEN 1 OF 1 COLLECTED 05/28/15): WELL DIFFERENTIATED ADENOCARCINOMA. Preliminary Diagnosis Intraoperative Diagnosis: Adequate (JDP)   RADIOGRAPHIC STUDIES: I have personally reviewed the radiological images as listed and agreed with the findings in the report. Ct Chest W Contrast  Result Date: 02/28/2016 CLINICAL DATA:  Restaging of pancreatic cancer diagnosed in January. Radiation therapy complete. Ongoing oral chemotherapy. Chronic fatigue. EXAM: CT CHEST, ABDOMEN, AND PELVIS WITH CONTRAST TECHNIQUE: Multidetector CT imaging of the chest, abdomen and pelvis was performed following the standard protocol during bolus administration of intravenous contrast. CONTRAST:  134m ISOVUE-300 IOPAMIDOL (ISOVUE-300) INJECTION 61% COMPARISON:  10/29/2015 FINDINGS: CT CHEST FINDINGS Cardiovascular: Aortic and branch vessel atherosclerosis. Tortuous thoracic aorta. Normal heart size, without pericardial effusion. Multivessel coronary artery atherosclerosis. No central pulmonary embolism, on this non-dedicated study. Mediastinum/Nodes: No supraclavicular adenopathy. No mediastinal or hilar adenopathy. Lungs/Pleura: Trace bilateral  pleural fluid is likely physiologic. Moderate centrilobular emphysema. Clear lungs. Musculoskeletal: No acute osseous abnormality. CT ABDOMEN PELVIS FINDINGS Hepatobiliary: Hepatomegaly at 20.2 cm. Focal steatosis adjacent the falciform ligament. A right hepatic lobe 2.6 cm cyst. No calcified gallstones or gallbladder wall thickening. There is trace pericholecystic fluid, nonspecific in the setting of ascites. Intrahepatic biliary ductal dilatation is mild and improved. The common duct measures 12 mm in the porta hepatis versus 16 mm on the prior exam. Pancreas: Marked pancreatic tail atrophy. Pancreatic body/ neck cystic component is likely due to marked  duct dilatation and similar. Infiltrative pancreatic head/neck mass is somewhat difficult to measure secondary to morphology. Measures on the order of 4.5 x 4.5 cm on image 103/series 4. Compare 5.1 x 5.1 cm on the prior exam. Mild peripancreatic interstitial thickening is improved and may relate to radiation therapy. Spleen: Normal in size, without focal abnormality. Adrenals/Urinary Tract: Normal adrenal glands. 1.6 cm upper pole right renal cyst or minimally complex cyst. Left renal sinus cysts. No hydronephrosis. Normal urinary bladder. Stomach/Bowel: Proximal gastric underdistention. The gastric antrum is also underdistended. Possible concurrent wall thickening. Example on image 107/series 4. Scattered colonic diverticula. Probable muscular hypertrophy involving the sigmoid. Normal small bowel. Vascular/Lymphatic: Aortic and branch vessel atherosclerosis. Tumor encasement of the celiac and superior mesenteric artery persists. These remain patent. Venous involvement, including of the splenic vein, superior mesenteric vein, and splenoportal confluence. collaterals including within the gastroepiploic distribution. Extensive gastric varices. No acute venous thrombus. No retroperitoneal or peripancreatic adenopathy in the abdomen. No pelvic sidewall adenopathy.  Reproductive: Normal uterus and adnexa. Other: Small volume abdominal pelvic fluid is similar. No evidence of omental or peritoneal disease. Musculoskeletal: No acute osseous abnormality. IMPRESSION: 1. Decreased size of an infiltrative pancreatic head and neck mass, consistent with adenocarcinoma. Improvement in biliary duct dilatation. 2. No evidence of metastatic disease. 3. Similar arterial and venous involvement with secondary gastroepiploic collaterals and gastric varices. 4. Similar small volume abdominal pelvic ascites. 5.  No acute process or evidence of metastatic disease in the chest. 6.  Coronary artery atherosclerosis. Aortic atherosclerosis. 7. Suspect gastric antral wall thickening, accentuated by underdistention. This may relate to radiation induced gastritis. Electronically Signed   By: Jeronimo Greaves M.D.   On: 02/28/2016 14:12   Ct Abdomen Pelvis W Contrast  Result Date: 02/28/2016 CLINICAL DATA:  Restaging of pancreatic cancer diagnosed in January. Radiation therapy complete. Ongoing oral chemotherapy. Chronic fatigue. EXAM: CT CHEST, ABDOMEN, AND PELVIS WITH CONTRAST TECHNIQUE: Multidetector CT imaging of the chest, abdomen and pelvis was performed following the standard protocol during bolus administration of intravenous contrast. CONTRAST:  ISOVUE-300 IOPAMIDOL (ISOVUE-300) INJECTION 61% COMPARISON:  10/29/2015 FINDINGS: CT CHEST FINDINGS Cardiovascular: Aortic and branch vessel atherosclerosis. Tortuous thoracic aorta. Normal heart size, without pericardial effusion. Multivessel coronary artery atherosclerosis. No central pulmonary embolism, on this non-dedicated study. Mediastinum/Nodes: No supraclavicular adenopathy. No mediastinal or hilar adenopathy. Lungs/Pleura: Trace bilateral pleural fluid is likely physiologic. Moderate centrilobular emphysema. Clear lungs. Musculoskeletal: No acute osseous abnormality. CT ABDOMEN PELVIS FINDINGS Hepatobiliary: Hepatomegaly at 20.2 cm. Focal  steatosis adjacent the falciform ligament. A right hepatic lobe 2.6 cm cyst. No calcified gallstones or gallbladder wall thickening. There is trace pericholecystic fluid, nonspecific in the setting of ascites. Intrahepatic biliary ductal dilatation is mild and improved. The common duct measures 12 mm in the porta hepatis versus 16 mm on the prior exam. Pancreas: Marked pancreatic tail atrophy. Pancreatic body/ neck cystic component is likely due to marked duct dilatation and similar. Infiltrative pancreatic head/neck mass is somewhat difficult to measure secondary to morphology. Measures on the order of 4.5 x 4.5 cm on image 103/series 4. Compare 5.1 x 5.1 cm on the prior exam. Mild peripancreatic interstitial thickening is improved and may relate to radiation therapy. Spleen: Normal in size, without focal abnormality. Adrenals/Urinary Tract: Normal adrenal glands. 1.6 cm upper pole right renal cyst or minimally complex cyst. Left renal sinus cysts. No hydronephrosis. Normal urinary bladder. Stomach/Bowel: Proximal gastric underdistention. The gastric antrum is also underdistended. Possible concurrent wall thickening. Example on image  107/series 4. Scattered colonic diverticula. Probable muscular hypertrophy involving the sigmoid. Normal small bowel. Vascular/Lymphatic: Aortic and branch vessel atherosclerosis. Tumor encasement of the celiac and superior mesenteric artery persists. These remain patent. Venous involvement, including of the splenic vein, superior mesenteric vein, and splenoportal confluence. collaterals including within the gastroepiploic distribution. Extensive gastric varices. No acute venous thrombus. No retroperitoneal or peripancreatic adenopathy in the abdomen. No pelvic sidewall adenopathy. Reproductive: Normal uterus and adnexa. Other: Small volume abdominal pelvic fluid is similar. No evidence of omental or peritoneal disease. Musculoskeletal: No acute osseous abnormality. IMPRESSION: 1.  Decreased size of an infiltrative pancreatic head and neck mass, consistent with adenocarcinoma. Improvement in biliary duct dilatation. 2. No evidence of metastatic disease. 3. Similar arterial and venous involvement with secondary gastroepiploic collaterals and gastric varices. 4. Similar small volume abdominal pelvic ascites. 5.  No acute process or evidence of metastatic disease in the chest. 6.  Coronary artery atherosclerosis. Aortic atherosclerosis. 7. Suspect gastric antral wall thickening, accentuated by underdistention. This may relate to radiation induced gastritis. Electronically Signed   By: Abigail Miyamoto M.D.   On: 02/28/2016 14:12    ASSESSMENT & PLAN:  82 year old Caucasian female, presented with intermittent abdominal pain, weight loss, and a CT finding of a pancreatic mass.  1. Pancreatic body/head adenocarcinoma, well differentiated, cT2N1M0, stage IIB, unresectable -I previously reviewed her CT chest results, which was negative for metastatic disease. -The biopsy results was reviewed with her and her family members in detail. -Her case was discussed in our GI tumor Board yesterday, Dr. Barry Dienes feels this is not resectable disease. -I reviewed the nature history of pancreatic cancer, which is aggressive. Her disease is incurable at this stage, and the goal of therapy is palliative and prolong her life. -she tolerated first line chemo gemcitabine poorly, with diarrhea, poor appetite, and skin rash, and was switched to second line Xeloda. - I reviewed her restaging CT scan from 6/26, which unfortunately showed disease progression in pancreas, no other metastasis.  -She has completed radiation, CA19.9 has decreased significantly after radiation, indicating good response. -I will request MSI test on her tumor tissue to see if she is a candidate for immunotherapy, unfortunately there was not enough tissue for the test. -I reviewed her restaging CT scan from 02/28/2016, which showed  decreased size of the pancreatic mass, no liver or other metastasis. -She is clinically doing better, with less pain, lab results reviewed, mild anemia, otherwise unremarkable. CA 19.9 has further dropped. I think she responded to radiation well.  2. Left LE DVT -Probably provoked by her underlying malignancy and chemotherapy -I recommend anticoagulation indefinitely, giving her incurable malignancy, if no contraindications such as bleeding. -She has had recurrent GI bleeding, Xarelto has been stopped    3. Fatigue, anorexia, and abdominal pain -improved some, she'll continue tramadol  and Vicodin as needed.   4.HTN, DM, hypothyroidism  -follow up with PCP   Plan -Scan reviewed, good partial response to radiation. -continue supportive care -I will see her back in 6 weeks for follow up and lab   All questions were answered. The patient knows to call the clinic with any problems, questions or concerns.  I spent 20 minutes counseling the patient face to face. The total time spent in the appointment was 25 minutes and more than 50% was on counseling.    [name], MD 03/06/2016

## 2016-03-06 NOTE — Telephone Encounter (Signed)
Appointments scheduled per 11/2 LOS. Patient given AVS report and calendars with future scheduled appointments °

## 2016-03-17 ENCOUNTER — Telehealth: Payer: Self-pay | Admitting: *Deleted

## 2016-03-17 ENCOUNTER — Other Ambulatory Visit: Payer: Self-pay | Admitting: *Deleted

## 2016-03-17 DIAGNOSIS — C259 Malignant neoplasm of pancreas, unspecified: Secondary | ICD-10-CM

## 2016-03-17 MED ORDER — HYDROCODONE-ACETAMINOPHEN 5-325 MG PO TABS: 1.0000 | ORAL_TABLET | ORAL | 0 refills | Status: DC | PRN

## 2016-03-17 NOTE — Telephone Encounter (Signed)
Received call from husband requesting refill of pain meds. Pt's    Phone      [phone].

## 2016-03-18 MED FILL — HYDROCODON-APAP 5-325: 5-325 | 8 days supply | Qty: 90 | Fill #0

## 2016-03-31 ENCOUNTER — Telehealth: Payer: Self-pay

## 2016-03-31 NOTE — Telephone Encounter (Signed)
Pt called to inform us that she bumped her forehead on the cabinet last night. It is a small bruise/bump. It is tender when touched but no pain otherwise. No headache. No blurred vision.

## 2016-04-16 NOTE — Progress Notes (Signed)
[place]  [phone] [phone]  Clinic Follow Up Note   Patient Care Team: Hulan Fess, MD as PCP - General (Family Medicine) [name], MD as Consulting Physician (Hematology) Arta Silence, MD as Consulting Physician (Gastroenterology) 04/17/2016  CHIEF COMPLAINTS:  Follow up unresectable pancreatic adenocarcinoma    OTHER RELATED ISSUES 1. Left LE DVT diagnosed on 06/09/2015, started on lovenox and bridged to coumadin, which was held on 10/22/2068 due to GI bleeding. Changed to Xarelto on 10/31/2015, held due to GI bleeding   Oncology History   Pancreatic cancer [facility])   Staging form: Pancreas, AJCC 7th Edition     Clinical stage from 05/27/2014: Stage IIB (T2, N1, M0) - Signed by [name], MD on 05/31/2015       Carcinoma of body of pancreas ([place])   05/16/2015 Imaging    CT chest, abdomen and pelvis with contrast showed a bulky mass at pancreatic neck/body, tumor encases the celiac and proximal branches and a cruise the main portal vein, peripancreatic adenopathy. no distant mets.       05/28/2015 Initial Biopsy    Pancreatic mass needle biopsy showed well differentiated adenocarcinoma      05/31/2015 Initial Diagnosis    Pancreatic cancer ([place])      05/31/2015 Tumor Marker    CA19.9 =375      06/05/2015 - 07/03/2015 Chemotherapy    weekly gemcitabine 1011m/m2, dose reduced to 859mm2 on week 2 due to tolerance issue      08/27/2015 - 10/30/2015 Chemotherapy    xeloda 100067mid, 14 days on, 7 days off, stopped due to disease progression       11/22/2015 - 12/05/2015 Radiation Therapy    Radiation to her pancreatic cancer       02/28/2016 Imaging    CT c/a/p with contrast IMPRESSION: 1. Decreased size of an infiltrative pancreatic head and neck mass, consistent with adenocarcinoma. Improvement in biliary duct dilatation. 2. No evidence of metastatic disease. 3. Similar arterial and venous involvement with  secondary gastroepiploic collaterals and gastric varices. 4. Similar small volume abdominal pelvic ascites. 5.  No acute process or evidence of metastatic disease in the chest. 6.  Coronary artery atherosclerosis. Aortic atherosclerosis. 7. Suspect gastric antral wall thickening, accentuated by underdistention. This may relate to radiation induced gastritis.       HISTORY OF PRESENTING ILLNESS:  Nicole Sherman 82o. female is here because of her recent abnormal CT scan, which showed a pink vaginal mass, highly suspicious for pancreatic cancer.  She has been haivng epigastric pain after meal for 4-5 months. She also reports left side pain at night when she sleeps on left side, mild back pain, the pain is worse lately, lasts longer especially afte rdinner, it about 5/10, appetite is lower than before, she lost aobut 10-20lbs in the past 5 months. No other complains, she had loose BM 1-2 time BM for 8-10 months, no hematochezia or melena. She was evaluated by her primary care physician Dr. LitRex Krasbdominal ultrasound was negative on 05/04/2015. She underwent a CT abdomen and pelvis with contrast on 05/16/2015, which showed a 4 x 1 cm mass in pancreatic body and a neck, tumor encases the distal celiac and proximal branches and occludes the main portal vein. No distant metastasis on the CT scan. She was referred to us [place] further workup.  She has good energy level, she is able to do all house work. She has intermittent dizziness from blood pressure meds. No  other complains.   CURRENT THERAPY: supportive care   INTERIM HISTORY: Mikaylee returns for follow up as scheduled. The patient had a fall at night last week and hit her face in a cabinet. She ended up with an inch long cut under her right eye. Pain has resolved. She reports feeling cold all of the time. She reports fatigue. The patient reports feeling depressed.  MEDICAL HISTORY:  Past Medical History:  Diagnosis Date  . Arthritis     osteoarthritis-hands wrist  . Cancer ([place])    pancreatic mass- dx. adenocarcinoma" CT abdomen 05-16-15  . Diabetes mellitus without complication ([place])   . Diverticulosis    showing on CT of abdomen 05-16-15  . Hypertension   . Hypothyroidism     SURGICAL HISTORY: Past Surgical History:  Procedure Laterality Date  . ANKLE SURGERY Right   . APPENDECTOMY     open  . CATARACT EXTRACTION Right   . EUS N/A 05/28/2015   Procedure: UPPER ENDOSCOPIC ULTRASOUND (EUS) LINEAR;  Surgeon: Arta Silence, MD;  Location: [facility] END[facility];  Service: Endoscopy;  Laterality: N/A;  . EYE SURGERY     macular hole surgery repair  . JOINT REPLACEMENT Right   . SHOULDER ARTHROSCOPY W/ ROTATOR CUFF REPAIR Left   . SHOULDER OPEN ROTATOR CUFF REPAIR Right   . TUBAL LIGATION      SOCIAL HISTORY: Social History   Social History  . Marital Status: Married    Spouse Name: N/A  . Number of Children: 2 daughters   . Years of Education: N/A   Occupational History  . Not on file.   Social History Main Topics  . Smoking status: Former Smoker -- 1.00 packs/day for 30 years    Types: Cigarettes    Quit date: 05/21/1986  . Smokeless tobacco: Not on file  . Alcohol Use: No  . Drug Use: No  . Sexual Activity: Not on file   Other Topics Concern  . Not on file   Social History Narrative    FAMILY HISTORY: Family History  Problem Relation Age of Onset  . Stroke Mother   . Cancer Sister     lung cancer     ALLERGIES:  is allergic to keflex [cephalexin] and aspirin.  MEDICATIONS:  Current Outpatient Prescriptions  Medication Sig Dispense Refill  . docusate sodium (COLACE) 100 MG capsule Take 100 mg by mouth 3 (three) times daily.    Marland Kitchen HYDROcodone-acetaminophen (NORCO/VICODIN) 5-325 MG tablet Take 1-2 tablets by mouth every 4 (four) hours as needed for moderate pain. 90 tablet 0  . levothyroxine (SYNTHROID, LEVOTHROID) 100 MCG tablet Take 100 mcg by mouth daily before breakfast.     . metFORMIN  (GLUCOPHAGE) 1000 MG tablet Take 1,000 mg by mouth 2 (two) times daily with a meal.     . omeprazole (PRILOSEC OTC) 20 MG tablet Take 20 mg by mouth daily as needed (acid reflux). Reported on 11/23/2015    . ondansetron (ZOFRAN) 8 MG tablet TAKE ONE TABLET BY MOUTH TWICE DAILY AS NEEDED FOR NAUSEA AND VOMITING 30 tablet 0  . prochlorperazine (COMPAZINE) 5 MG tablet Take 1-2 tablets (5-10 mg total) by mouth every 8 (eight) hours as needed for nausea or vomiting. 30 tablet 2   No current facility-administered medications for this visit.     REVIEW OF SYSTEMS: Constitutional: Denies fevers, chills or abnormal night sweats. (+) Fatigue (+) Depressed Eyes: Denies blurriness of vision, double vision or watery eyes Ears, nose, mouth, throat, and face: Denies mucositis  or sore throat Respiratory: Denies cough, dyspnea or wheezes Cardiovascular: Denies palpitation, chest discomfort or lower extremity swelling Gastrointestinal:  Denies nausea, heartburn or change in bowel habits Skin: Denies abnormal skin rashes. Lymphatics: Denies new lymphadenopathy or easy bruising Neurological:Denies numbness, tingling or new weaknesses Behavioral/Psych: Mood is stable, no new changes  All other systems were reviewed with the patient and are negative.  PHYSICAL EXAMINATION: ECOG PERFORMANCE STATUS: 1-2  Vitals:   04/17/16 1332  BP: 125/66  Pulse: 72  Resp: 17  Temp: 98.3 F (36.8 C)   Filed Weights   04/17/16 1332  Weight: 148 lb 6.4 oz (67.3 kg)    GENERAL:alert. (+) Teary during the encounter. SKIN: skin color, texture, turgor are normal EYES: normal, conjunctiva are pink and non-injected, sclera clear OROPHARYNX:no exudate, no erythema and lips, buccal mucosa, and tongue normal  NECK: supple, thyroid normal size, non-tender, without nodularity LYMPH:  no palpable lymphadenopathy in the cervical, axillary or inguinal LUNGS: clear to auscultation and percussion with normal breathing  effort HEART: regular rate & rhythm and no murmurs and no lower extremity edema ABDOMEN:abdomen soft, non-tender and normal bowel sounds Musculoskeletal:no cyanosis of digits and no clubbing  PSYCH: alert & oriented x 3 with fluent speech NEURO: no focal motor/sensory deficits  LABORATORY DATA:  I have reviewed the data as listed  CBC Latest Ref Rng & Units 04/17/2016 02/28/2016 02/05/2016  WBC 3.9 - 10.3 10e3/uL 3.7(L) 3.9 4.5  Hemoglobin 11.6 - 15.9 g/dL 8.6(L) 9.3(L) 9.5(L)  Hematocrit 34.8 - 46.6 % 27.2(L) 28.7(L) 29.4(L)  Platelets 145 - 400 10e3/uL 194 205 223   CMP Latest Ref Rng & Units 04/17/2016 02/28/2016 01/30/2016  Glucose 70 - 140 mg/dl 133 117 187(H)  BUN 7.0 - 26.0 mg/dL 16.1 15.4 16.9  Creatinine 0.6 - 1.1 mg/dL 1.0 0.9 0.9  Sodium 136 - 145 mEq/L 140 140 139  Potassium 3.5 - 5.1 mEq/L 4.0 3.7 4.7  Chloride 101 - 111 mmol/L - - -  CO2 22 - 29 mEq/L 24 25 24   Calcium 8.4 - 10.4 mg/dL 9.3 8.9 8.7  Total Protein 6.4 - 8.3 g/dL 7.1 6.6 6.2(L)  Total Bilirubin 0.20 - 1.20 mg/dL 0.32 0.28 <0.30  Alkaline Phos 40 - 150 U/L 71 73 82  AST 5 - 34 U/L 22 17 19   ALT 0 - 55 U/L 17 8 11    Protime w/INR 10/29/15: 14.4/1.20  CA19.9 u/ML 05/31/2015: 751 07/03/2015: 227 07/19/2015: 159 08/23/2015: 304 09/03/2015: 309 10/15/2015: 357 12/31/2015: 121 01/30/2016: 77 02/28/2016: 51 04/17/16: Pending  PATHOLOGY REPORT  Diagnosis 05/28/2015 FINE NEEDLE ASPIRATION: NEEDLE ASPIRATION, PANCREAS BODY (SPECIMEN 1 OF 1 COLLECTED 05/28/15): WELL DIFFERENTIATED ADENOCARCINOMA. Preliminary Diagnosis Intraoperative Diagnosis: Adequate (JDP)   RADIOGRAPHIC STUDIES: I have personally reviewed the radiological images as listed and agreed with the findings in the report. No results found.  ASSESSMENT & PLAN:  82 y.o.Caucasian female, presented with intermittent abdominal pain, weight loss, and a CT finding of a pancreatic mass.  1. Pancreatic body/head adenocarcinoma, well differentiated,  cT2N1M0, stage IIB, unresectable -I previously reviewed her CT chest results, which was negative for metastatic disease. -The biopsy results was previously reviewed with her and her family members in detail. -Her case was previously discussed in our GI tumor Board, Dr. Barry Dienes felt this is not resectable disease. -I previously reviewed the nature history of pancreatic cancer, which is aggressive. Her disease is incurable at this stage, and the goal of therapy is palliative and prolong her life. -she tolerated first  line chemo gemcitabine poorly, with diarrhea, poor appetite, and skin rash, and was switched to second line Xeloda. - I previously reviewed her restaging CT scan from 6/26, which unfortunately showed disease progression in pancreas, no other metastasis.  -She has completed radiation. CA19.9 has decreased significantly after radiation, indicating good response. -I have requested MSI test on her tumor tissue to see if she is a candidate for immunotherapy, unfortunately there was not enough tissue for the test. -I previously reviewed her restaging CT scan from 02/28/2016, which showed decreased size of the pancreatic mass, no liver or other metastasis. -She is clinically doing better, with less pain, lab results reviewed, mild anemia, otherwise unremarkable. CA 19.9 has further dropped after radiation and  I think she responded to radiation well. CA 19.9 pending today.  2. Left LE DVT -Probably provoked by her underlying malignancy and chemotherapy -I recommended anticoagulation indefinitely, giving her incurable malignancy, if no contraindications such as bleeding. -She has had recurrent GI bleeding, Xarelto has been stopped  3. Fatigue, anorexia, and abdominal pain -improved some, she'll continue tramadol and Vicodin as needed.  4.HTN, DM, hypothyroidism  -follow up with PCP   5. Anemia -Hemoglobin has steadily decreased since July 2017. 8.6 today (04/17/16) and the patient is  symptomatic with fatigue -I will schedule her for a blood transfusion within the next week. -Ferritin level is 16 today (04/17/16). I advised her to start taking an oral iron supplement.  6. Depression  -We discussed the patient feeling depressed by many factors; the cold weather and her cancer diagnosis. -We discussed she is doing well from her cancer standpoint. I encouraged her to be positive. We discussed antidepressant medication, she has respectfully declined for now.   7. Goal of care -We discussed her goal of care is palliative, giving the unresectable pancreatic cancer -Due to her advanced age and poor tolerance to chemotherapy, we'll continue palliative care alone. -We previously discussed hospice, she is not ready for it.  Plan -lab reviewed  -continue supportive care -I will see her back in 4-5 weeks for follow up and lab  -Blood transfusion within the next week.  All questions were answered. The patient knows to call the clinic with any problems, questions or concerns.  I spent 30 minutes counseling the patient face to face. The total time spent in the appointment was 40 minutes and more than 50% was on counseling.    [name], MD 04/17/2016  This document serves as a record of services personally performed by [name], MD. It was created on her behalf by Darcus Austin, a trained medical scribe. The creation of this record is based on the scribe's personal observations and the provider's statements to them. This document has been checked and approved by the attending provider.

## 2016-04-17 ENCOUNTER — Ambulatory Visit (HOSPITAL_COMMUNITY)
Admission: RE | Admit: 2016-04-17 | Discharge: 2016-04-17 | Disposition: A | Discharge status: Home / Self Care | Payer: Medicare Other | Source: Ambulatory Visit | Attending: Hematology | Admitting: Hematology

## 2016-04-17 ENCOUNTER — Ambulatory Visit (HOSPITAL_BASED_OUTPATIENT_CLINIC_OR_DEPARTMENT_OTHER): Payer: Medicare Other | Admitting: Hematology

## 2016-04-17 ENCOUNTER — Encounter: Payer: Self-pay | Admitting: Hematology

## 2016-04-17 ENCOUNTER — Ambulatory Visit: Payer: Medicare Other

## 2016-04-17 ENCOUNTER — Telehealth: Payer: Self-pay | Admitting: Hematology

## 2016-04-17 ENCOUNTER — Other Ambulatory Visit (HOSPITAL_BASED_OUTPATIENT_CLINIC_OR_DEPARTMENT_OTHER): Payer: Medicare Other

## 2016-04-17 VITALS — BP 125/66 | HR 72 | Temp 98.3°F | Resp 17 | Ht 66.5 in | Wt 148.4 lb

## 2016-04-17 DIAGNOSIS — C251 Malignant neoplasm of body of pancreas: Secondary | ICD-10-CM

## 2016-04-17 DIAGNOSIS — E039 Hypothyroidism, unspecified: Secondary | ICD-10-CM

## 2016-04-17 DIAGNOSIS — E119 Type 2 diabetes mellitus without complications: Secondary | ICD-10-CM

## 2016-04-17 DIAGNOSIS — I10 Essential (primary) hypertension: Secondary | ICD-10-CM

## 2016-04-17 DIAGNOSIS — C250 Malignant neoplasm of head of pancreas: Secondary | ICD-10-CM

## 2016-04-17 DIAGNOSIS — I824Z2 Acute embolism and thrombosis of unspecified deep veins of left distal lower extremity: Secondary | ICD-10-CM | POA: Diagnosis not present

## 2016-04-17 DIAGNOSIS — F329 Major depressive disorder, single episode, unspecified: Secondary | ICD-10-CM

## 2016-04-17 DIAGNOSIS — D630 Anemia in neoplastic disease: Secondary | ICD-10-CM

## 2016-04-17 DIAGNOSIS — C259 Malignant neoplasm of pancreas, unspecified: Secondary | ICD-10-CM

## 2016-04-17 LAB — COMPREHENSIVE METABOLIC PANEL
ALBUMIN: 3.3 g/dL — AB (ref 3.5–5.0)
ALK PHOS: 71 U/L (ref 40–150)
ALT: 17 U/L (ref 0–55)
AST: 22 U/L (ref 5–34)
Anion Gap: 8 mEq/L (ref 3–11)
BUN: 16.1 mg/dL (ref 7.0–26.0)
CO2: 24 meq/L (ref 22–29)
Calcium: 9.3 mg/dL (ref 8.4–10.4)
Chloride: 107 mEq/L (ref 98–109)
Creatinine: 1 mg/dL (ref 0.6–1.1)
EGFR: 51 mL/min/{1.73_m2} — AB (ref >90)
GLUCOSE: 133 mg/dL (ref 70–140)
POTASSIUM: 4 meq/L (ref 3.5–5.1)
SODIUM: 140 meq/L (ref 136–145)
TOTAL PROTEIN: 7.1 g/dL (ref 6.4–8.3)
Total Bilirubin: 0.32 mg/dL (ref 0.20–1.20)

## 2016-04-17 LAB — FERRITIN: Ferritin: 16 ng/ml (ref 9–269)

## 2016-04-17 LAB — CBC WITH DIFFERENTIAL/PLATELET
BASO%: 0.2 % (ref 0.0–2.0)
Basophils Absolute: 0 10*3/uL (ref 0.0–0.1)
EOS ABS: 0.1 10*3/uL (ref 0.0–0.5)
EOS%: 1.7 % (ref 0.0–7.0)
HCT: 27.2 % — ABNORMAL LOW (ref 34.8–46.6)
HEMOGLOBIN: 8.6 g/dL — AB (ref 11.6–15.9)
LYMPH%: 17.3 % (ref 14.0–49.7)
MCH: 31 pg (ref 25.1–34.0)
MCHC: 31.7 g/dL (ref 31.5–36.0)
MCV: 97.6 fL (ref 79.5–101.0)
MONO#: 0.4 10*3/uL (ref 0.1–0.9)
MONO%: 10.5 % (ref 0.0–14.0)
NEUT%: 70.3 % (ref 38.4–76.8)
NEUTROS ABS: 2.6 10*3/uL (ref 1.5–6.5)
Platelets: 194 10*3/uL (ref 145–400)
RBC: 2.78 10*6/uL — AB (ref 3.70–5.45)
RDW: 14.2 % (ref 11.2–14.5)
WBC: 3.7 10*3/uL — AB (ref 3.9–10.3)
lymph#: 0.6 10*3/uL — ABNORMAL LOW (ref 0.9–3.3)

## 2016-04-17 MED ORDER — HYDROCODONE-ACETAMINOPHEN 5-325 MG PO TABS: 1.0000 | ORAL_TABLET | ORAL | 0 refills | Status: DC | PRN

## 2016-04-17 NOTE — Telephone Encounter (Signed)
Appointments scheduled per 12/14 LOS. Patient given AVS report and calendars with future scheduled appointments.  °

## 2016-04-18 LAB — CANCER ANTIGEN 19-9: CA 19-9: 39 U/mL — ABNORMAL HIGH (ref 0–35)

## 2016-04-21 ENCOUNTER — Ambulatory Visit (HOSPITAL_COMMUNITY)
Admission: RE | Admit: 2016-04-21 | Discharge: 2016-04-21 | Disposition: A | Discharge status: Home / Self Care | Payer: Medicare Other | Source: Ambulatory Visit | Attending: Hematology | Admitting: Hematology

## 2016-04-21 ENCOUNTER — Ambulatory Visit: Payer: Medicare Other

## 2016-04-21 DIAGNOSIS — I10 Essential (primary) hypertension: Secondary | ICD-10-CM | POA: Diagnosis not present

## 2016-04-21 DIAGNOSIS — C259 Malignant neoplasm of pancreas, unspecified: Secondary | ICD-10-CM

## 2016-04-21 DIAGNOSIS — E039 Hypothyroidism, unspecified: Secondary | ICD-10-CM

## 2016-04-21 DIAGNOSIS — C251 Malignant neoplasm of body of pancreas: Secondary | ICD-10-CM

## 2016-04-21 DIAGNOSIS — I824Z2 Acute embolism and thrombosis of unspecified deep veins of left distal lower extremity: Secondary | ICD-10-CM | POA: Diagnosis not present

## 2016-04-21 DIAGNOSIS — E119 Type 2 diabetes mellitus without complications: Secondary | ICD-10-CM | POA: Diagnosis not present

## 2016-04-21 LAB — PREPARE RBC (CROSSMATCH)

## 2016-04-21 MED ORDER — SODIUM CHLORIDE 0.9% FLUSH: 10.0000 mL | INTRAVENOUS | Status: DC | PRN

## 2016-04-21 MED ORDER — HEPARIN SOD (PORK) LOCK FLUSH 100 UNIT/ML IV SOLN: 500.0000 [IU] | Freq: Every day | INTRAVENOUS | Status: DC | PRN

## 2016-04-21 MED ORDER — ACETAMINOPHEN 325 MG PO TABS
650.0000 mg | ORAL_TABLET | Freq: Once | ORAL | Status: AC
  Administered 2016-04-21: 650 mg via ORAL
  Filled 2016-04-21: qty 2

## 2016-04-21 MED ORDER — SODIUM CHLORIDE 0.9 % IV SOLN
250.0000 mL | Freq: Once | INTRAVENOUS | Status: AC
  Administered 2016-04-21: 250 mL via INTRAVENOUS

## 2016-04-21 MED FILL — HYDROCODON-APAP 5-325: 5-325 | 8 days supply | Qty: 90 | Fill #0

## 2016-04-21 NOTE — Progress Notes (Signed)
Diagnosis Association: Carcinoma of body of pancreas ([place]) (C25.1);   Dr. Ky Barban  Procedure: Pt received 2 units of PRBCs via piv.  Pt tolerated procedure well.  Post procedure: Pt alert, oriented and ambulatory at discharge. Discharge instructions given with verbal understanding. Pt discharged to home with husband.

## 2016-04-22 LAB — TYPE AND SCREEN
ABO/RH(D): O POS
ANTIBODY SCREEN: NEGATIVE
Unit division: 0
Unit division: 0

## 2016-04-23 ENCOUNTER — Telehealth: Payer: Self-pay | Admitting: *Deleted

## 2016-04-23 NOTE — Telephone Encounter (Signed)
Called and spoke with patient.  Let her know that Dr. Burr Medico said to observe at home for now, however if she has any more rectal bleeding, or any dizziness, fever or pain then she needs to call us back or go to the Emergency Room.  She verbalized her understanding and agreement with this plan.

## 2016-04-23 NOTE — Telephone Encounter (Signed)
Message from pt reporting blood in stool today. Returned call, she reports one episode of bright red blood with a BM today. Pt reports it was a large amount of blood. Denies any constipation or hemorrhoids. Reports this has happened one time before. Next lab/office 05/20/16. Message to provider for review.

## 2016-04-23 NOTE — Telephone Encounter (Signed)
OK to observe at home for now, let pt call us back or go to ED if she has recurrent rectal bleeding, or symptoms of dizziness, fever or pain, etc.   [name] MD

## 2016-04-23 NOTE — Telephone Encounter (Signed)
   Vijay Durflinger MD  

## 2016-04-29 ENCOUNTER — Other Ambulatory Visit: Payer: Self-pay | Admitting: Nurse Practitioner

## 2016-04-29 ENCOUNTER — Ambulatory Visit (HOSPITAL_BASED_OUTPATIENT_CLINIC_OR_DEPARTMENT_OTHER): Payer: Medicare Other | Admitting: Nurse Practitioner

## 2016-04-29 ENCOUNTER — Telehealth: Payer: Self-pay | Admitting: *Deleted

## 2016-04-29 ENCOUNTER — Ambulatory Visit (HOSPITAL_BASED_OUTPATIENT_CLINIC_OR_DEPARTMENT_OTHER): Payer: Medicare Other

## 2016-04-29 ENCOUNTER — Other Ambulatory Visit: Payer: Self-pay | Admitting: *Deleted

## 2016-04-29 VITALS — BP 153/71 | HR 71 | Temp 97.6°F | Resp 16 | Ht 66.5 in | Wt 150.0 lb

## 2016-04-29 DIAGNOSIS — K922 Gastrointestinal hemorrhage, unspecified: Secondary | ICD-10-CM | POA: Diagnosis not present

## 2016-04-29 DIAGNOSIS — W19XXXD Unspecified fall, subsequent encounter: Secondary | ICD-10-CM

## 2016-04-29 DIAGNOSIS — C251 Malignant neoplasm of body of pancreas: Secondary | ICD-10-CM | POA: Diagnosis not present

## 2016-04-29 DIAGNOSIS — E039 Hypothyroidism, unspecified: Secondary | ICD-10-CM

## 2016-04-29 DIAGNOSIS — Z7901 Long term (current) use of anticoagulants: Secondary | ICD-10-CM

## 2016-04-29 DIAGNOSIS — Y92009 Unspecified place in unspecified non-institutional (private) residence as the place of occurrence of the external cause: Secondary | ICD-10-CM

## 2016-04-29 DIAGNOSIS — I959 Hypotension, unspecified: Secondary | ICD-10-CM

## 2016-04-29 LAB — COMPREHENSIVE METABOLIC PANEL
ALBUMIN: 3.3 g/dL — AB (ref 3.5–5.0)
ALK PHOS: 61 U/L (ref 40–150)
ALT: 20 U/L (ref 0–55)
ANION GAP: 8 meq/L (ref 3–11)
AST: 25 U/L (ref 5–34)
BILIRUBIN TOTAL: 0.3 mg/dL (ref 0.20–1.20)
BUN: 25 mg/dL (ref 7.0–26.0)
CO2: 25 mEq/L (ref 22–29)
Calcium: 9 mg/dL (ref 8.4–10.4)
Chloride: 109 mEq/L (ref 98–109)
Creatinine: 1.2 mg/dL — ABNORMAL HIGH (ref 0.6–1.1)
EGFR: 44 mL/min/{1.73_m2} — ABNORMAL LOW (ref >90)
Glucose: 172 mg/dl — ABNORMAL HIGH (ref 70–140)
Potassium: 3.8 mEq/L (ref 3.5–5.1)
Sodium: 142 mEq/L (ref 136–145)
TOTAL PROTEIN: 6.5 g/dL (ref 6.4–8.3)

## 2016-04-29 LAB — CBC WITH DIFFERENTIAL/PLATELET
BASO%: 0.8 % (ref 0.0–2.0)
BASOS ABS: 0 10*3/uL (ref 0.0–0.1)
EOS ABS: 0.1 10*3/uL (ref 0.0–0.5)
EOS%: 2.5 % (ref 0.0–7.0)
HEMATOCRIT: 26.5 % — AB (ref 34.8–46.6)
HEMOGLOBIN: 8.7 g/dL — AB (ref 11.6–15.9)
LYMPH%: 19.1 % (ref 14.0–49.7)
MCH: 31.3 pg (ref 25.1–34.0)
MCHC: 32.9 g/dL (ref 31.5–36.0)
MCV: 95.3 fL (ref 79.5–101.0)
MONO#: 0.3 10*3/uL (ref 0.1–0.9)
MONO%: 8.5 % (ref 0.0–14.0)
NEUT#: 2.5 10*3/uL (ref 1.5–6.5)
NEUT%: 69.1 % (ref 38.4–76.8)
Platelets: 160 10*3/uL (ref 145–400)
RBC: 2.78 10*6/uL — ABNORMAL LOW (ref 3.70–5.45)
RDW: 15.4 % — AB (ref 11.2–14.5)
WBC: 3.7 10*3/uL — ABNORMAL LOW (ref 3.9–10.3)
lymph#: 0.7 10*3/uL — ABNORMAL LOW (ref 0.9–3.3)

## 2016-04-29 LAB — HOLD TUBE, BLOOD BANK

## 2016-04-29 LAB — SAMPLE TO BLOOD BANK

## 2016-04-29 NOTE — Telephone Encounter (Signed)
Verbal order received and read back from Dr. Burr Medico for lab and [facility] visit today.  Lab at 2:30 pm followed by [facility] Order given to Ms. Leever at this time to come to [facility] Today for lab and [facility] visit.

## 2016-04-29 NOTE — Telephone Encounter (Signed)
"  I'm calling to speak with Nicole Sherman.  My mom needs to come in for lab work.  She's having blood in her stools.  Has an appointment with Dr. Paulita Fujita 05-06-2016 and he says lab will be helpful to determine if endoscopy or colonoscopy is needed.  She's had this before and lab work before GI F/U is needed." No appointment in this office until 05-20-2016.  GI office can draw labs or send to outpatient [place].  This nurse instructed her to go to ED. "She doesn't want to go and have to wait and the germs in the ED.  I will try." This nurse will notify Dr. Burr Medico.

## 2016-04-29 NOTE — Telephone Encounter (Signed)
FYI "I need Dr. Burr Medico to know every BM I have, there's blood in my stools and it's a lot.  Happens once a day, sometimes twice starting December 19 th after blood transfusion on the 18 th.  I need something done.  The bowl water is red."  Instructed to go to the ED.  Has monitored this at home and due to continuance, report to ED.  "Denies fever, abdominal cramps on occasion, beginning to feel fatigued, tired and weak.  I just got out of bed, need to shower and get dressed.  Can't I come see Dr. Burr Medico or is this a true emergency."    Advised she has time to get dressed but the level of care needed exceeds what we can provide in the office and this was Dr. Lewayne Bunting instructions with last week's call about this.  Will go to ED after she gets dressed."

## 2016-05-01 ENCOUNTER — Other Ambulatory Visit (HOSPITAL_BASED_OUTPATIENT_CLINIC_OR_DEPARTMENT_OTHER): Payer: Medicare Other

## 2016-05-01 ENCOUNTER — Telehealth: Payer: Self-pay | Admitting: Nurse Practitioner

## 2016-05-01 ENCOUNTER — Encounter: Payer: Self-pay | Admitting: Nurse Practitioner

## 2016-05-01 DIAGNOSIS — C251 Malignant neoplasm of body of pancreas: Secondary | ICD-10-CM | POA: Diagnosis not present

## 2016-05-01 LAB — CBC WITH DIFFERENTIAL/PLATELET
BASO%: 0.9 % (ref 0.0–2.0)
Basophils Absolute: 0 10*3/uL (ref 0.0–0.1)
EOS ABS: 0.1 10*3/uL (ref 0.0–0.5)
EOS%: 1.8 % (ref 0.0–7.0)
HCT: 26.5 % — ABNORMAL LOW (ref 34.8–46.6)
HEMOGLOBIN: 8.8 g/dL — AB (ref 11.6–15.9)
LYMPH%: 12.9 % — ABNORMAL LOW (ref 14.0–49.7)
MCH: 31.2 pg (ref 25.1–34.0)
MCHC: 33 g/dL (ref 31.5–36.0)
MCV: 94.7 fL (ref 79.5–101.0)
MONO#: 0.4 10*3/uL (ref 0.1–0.9)
MONO%: 9.4 % (ref 0.0–14.0)
NEUT%: 75 % (ref 38.4–76.8)
NEUTROS ABS: 3.3 10*3/uL (ref 1.5–6.5)
Platelets: 169 10*3/uL (ref 145–400)
RBC: 2.8 10*6/uL — ABNORMAL LOW (ref 3.70–5.45)
RDW: 16.1 % — AB (ref 11.2–14.5)
WBC: 4.3 10*3/uL (ref 3.9–10.3)
lymph#: 0.6 10*3/uL — ABNORMAL LOW (ref 0.9–3.3)

## 2016-05-01 NOTE — Assessment & Plan Note (Signed)
Patient is currently undergoing observation only for treatment of her pancreatic cancer.  She is scheduled to return on 05/01/2016 for labs.  She is scheduled to return on 05/20/2016 for labs and a follow-up visit.

## 2016-05-01 NOTE — Progress Notes (Signed)
SYMPTOM MANAGEMENT CLINIC    Chief Complaint: GI bleeding  HPI:  Nicole Sherman 82 y.o. female diagnosed with pancreatic cancer.  Currently undergoing observation only.   Oncology History   Pancreatic cancer [facility]   Staging form: Pancreas, AJCC 7th Edition     Clinical stage from 05/27/2014: Stage IIB (T2, N1, M0) - Signed by [name], MD on 05/31/2015       Carcinoma of body of pancreas ([place])   05/16/2015 Imaging    CT chest, abdomen and pelvis with contrast showed a bulky mass at pancreatic neck/body, tumor encases the celiac and proximal branches and a cruise the main portal vein, peripancreatic adenopathy. no distant mets.       05/28/2015 Initial Biopsy    Pancreatic mass needle biopsy showed well differentiated adenocarcinoma      05/31/2015 Initial Diagnosis    Pancreatic cancer ([place])      05/31/2015 Tumor Marker    CA19.9 =375      06/05/2015 - 07/03/2015 Chemotherapy    weekly gemcitabine 1050m/m2, dose reduced to 8531mm2 on week 2 due to tolerance issue      08/27/2015 - 10/30/2015 Chemotherapy    xeloda 100020mid, 14 days on, 7 days off, stopped due to disease progression       11/22/2015 - 12/05/2015 Radiation Therapy    Radiation to her pancreatic cancer       02/28/2016 Imaging    CT c/a/p with contrast IMPRESSION: 1. Decreased size of an infiltrative pancreatic head and neck mass, consistent with adenocarcinoma. Improvement in biliary duct dilatation. 2. No evidence of metastatic disease. 3. Similar arterial and venous involvement with secondary gastroepiploic collaterals and gastric varices. 4. Similar small volume abdominal pelvic ascites. 5.  No acute process or evidence of metastatic disease in the chest. 6.  Coronary artery atherosclerosis. Aortic atherosclerosis. 7. Suspect gastric antral wall thickening, accentuated by underdistention. This may relate to radiation induced gastritis.       Review of Systems  Constitutional: Positive for  malaise/fatigue.  Gastrointestinal: Positive for melena.  All other systems reviewed and are negative.   Past Medical History:  Diagnosis Date  . Arthritis    osteoarthritis-hands wrist  . [place]  pancreatic mass- dx. adenocarcinoma" CT abdomen 05-16-15  . Diabetes mellitus without complication ([place] . Diverticulosis    showing on CT of abdomen 05-16-15  . Hypertension   . Hypothyroidism     Past Surgical History:  Procedure Laterality Date  . ANKLE SURGERY Right   . APPENDECTOMY     open  . CATARACT EXTRACTION Right   . EUS N/A 05/28/2015   Procedure: UPPER ENDOSCOPIC ULTRASOUND (EUS) LINEAR;  Surgeon: WilArta SilenceD;  Location: [facility];  Service: Endoscopy;  Laterality: N/A;  . EYE SURGERY     macular hole surgery repair  . JOINT REPLACEMENT Right   . SHOULDER ARTHROSCOPY W/ ROTATOR CUFF REPAIR Left   . SHOULDER OPEN ROTATOR CUFF REPAIR Right   . TUBAL LIGATION      has Carcinoma of body of pancreas ([place] deep vein thrombosis (DVT) of distal vein of left lower extremity ([place]; Hypertension; Diabetes mellitus without complication ([place]CC[place] GI bleeding; and Hypoalbuminemia due to protein-calorie malnutrition ([place]CC[place] her problem list.    is allergic to keflex [cephalexin] and aspirin.  Allergies as of 04/29/2016      Reactions   Keflex [cephalexin] Rash   Aspirin    Blood in stool  Medication List       Accurate as of 04/29/16 11:59 PM. Always use your most recent med list.          docusate sodium 100 MG capsule Commonly known as:  COLACE Take 100 mg by mouth 3 (three) times daily.   HYDROcodone-acetaminophen 5-325 MG tablet Commonly known as:  NORCO/VICODIN Take 1-2 tablets by mouth every 4 (four) hours as needed for moderate pain.   levothyroxine 100 MCG tablet Commonly known as:  SYNTHROID, LEVOTHROID Take 100 mcg by mouth daily before breakfast.   metFORMIN 1000 MG tablet Commonly known as:   GLUCOPHAGE Take 1,000 mg by mouth 2 (two) times daily with a meal.   omeprazole 20 MG tablet Commonly known as:  PRILOSEC OTC Take 20 mg by mouth daily as needed (acid reflux). Reported on 11/23/2015   ondansetron 8 MG tablet Commonly known as:  ZOFRAN TAKE ONE TABLET BY MOUTH TWICE DAILY AS NEEDED FOR NAUSEA AND VOMITING   prochlorperazine 5 MG tablet Commonly known as:  COMPAZINE Take 1-2 tablets (5-10 mg total) by mouth every 8 (eight) hours as needed for nausea or vomiting.        PHYSICAL EXAMINATION  Oncology Vitals 04/29/2016 04/21/2016  Height 169 cm -  Weight 68.04 kg -  Weight (lbs) 150 lbs -  BMI (kg/m2) 23.85 kg/m2 -  Temp 97.6 98.3  Pulse 71 60  Resp 16 18  SpO2 98 97  BSA (m2) 1.79 m2 -   BP Readings from Last 2 Encounters:  04/29/16 (!) 153/71  04/21/16 114/70    Physical Exam  Constitutional: She is oriented to person, place, and time and well-developed, well-nourished, and in no distress.  HENT:  Head: Normocephalic and atraumatic.  Eyes: Conjunctivae and EOM are normal. Pupils are equal, round, and reactive to light. Right eye exhibits no discharge. Left eye exhibits no discharge. No scleral icterus.  Neck: Normal range of motion.  Pulmonary/Chest: Effort normal. No respiratory distress.  Musculoskeletal: Normal range of motion.  Neurological: She is alert and oriented to person, place, and time. Gait normal.  Skin: Skin is warm and dry. There is pallor.  Psychiatric: Affect normal.  Nursing note and vitals reviewed.   LABORATORY DATA:. Appointment on 04/29/2016  Component Date Value Ref Range Status  . WBC 04/29/2016 3.7* 3.9 - 10.3 10e3/uL Final  . NEUT# 04/29/2016 2.5  1.5 - 6.5 10e3/uL Final  . HGB 04/29/2016 8.7* 11.6 - 15.9 g/dL Final  . HCT 04/29/2016 26.5* 34.8 - 46.6 % Final  . Platelets 04/29/2016 160  145 - 400 10e3/uL Final  . MCV 04/29/2016 95.3  79.5 - 101.0 fL Final  . MCH 04/29/2016 31.3  25.1 - 34.0 pg Final  . MCHC  04/29/2016 32.9  31.5 - 36.0 g/dL Final  . RBC 04/29/2016 2.78* 3.70 - 5.45 10e6/uL Final  . RDW 04/29/2016 15.4* 11.2 - 14.5 % Final  . lymph# 04/29/2016 0.7* 0.9 - 3.3 10e3/uL Final  . MONO# 04/29/2016 0.3  0.1 - 0.9 10e3/uL Final  . Eosinophils Absolute 04/29/2016 0.1  0.0 - 0.5 10e3/uL Final  . Basophils Absolute 04/29/2016 0.0  0.0 - 0.1 10e3/uL Final  . NEUT% 04/29/2016 69.1  38.4 - 76.8 % Final  . LYMPH% 04/29/2016 19.1  14.0 - 49.7 % Final  . MONO% 04/29/2016 8.5  0.0 - 14.0 % Final  . EOS% 04/29/2016 2.5  0.0 - 7.0 % Final  . BASO% 04/29/2016 0.8  0.0 - 2.0 % Final  .  Hold Tube, Blood Bank 04/29/2016 Sample held in Blood Bank 72 hrs.   Corrected  . Sodium 04/29/2016 142  136 - 145 mEq/L Final  . Potassium 04/29/2016 3.8  3.5 - 5.1 mEq/L Final  . Chloride 04/29/2016 109  98 - 109 mEq/L Final  . CO2 04/29/2016 25  22 - 29 mEq/L Final  . Glucose 04/29/2016 172* 70 - 140 mg/dl Final  . BUN 04/29/2016 25.0  7.0 - 26.0 mg/dL Final  . Creatinine 04/29/2016 1.2* 0.6 - 1.1 mg/dL Final  . Total Bilirubin 04/29/2016 0.30  0.20 - 1.20 mg/dL Final  . Alkaline Phosphatase 04/29/2016 61  40 - 150 U/L Final  . AST 04/29/2016 25  5 - 34 U/L Final  . ALT 04/29/2016 20  0 - 55 U/L Final  . Total Protein 04/29/2016 6.5  6.4 - 8.3 g/dL Final  . Albumin 04/29/2016 3.3* 3.5 - 5.0 g/dL Final  . Calcium 04/29/2016 9.0  8.4 - 10.4 mg/dL Final  . Anion Gap 04/29/2016 8  3 - 11 mEq/L Final  . EGFR 04/29/2016 44* >90 ml/min/1.73 m2 Final    RADIOGRAPHIC STUDIES: No results found.  ASSESSMENT/PLAN:    Lower GI bleeding Patient has a history of chronic, intermittent GI bleeding.  She has been diagnosed in the past with a DVT; and has intermittently been on anticoagulation the past-but this apparently increased issues with the GI bleeding and patient made the decision to hold any further anticoagulation in the future-despite the increased risk for new blood clots.  Patient states that her hemoglobin  had decreased down to 8.6; and patient received 2 units of packed red blood cells transfusion on 04/21/2016.  She noticed approximately 24 hours ago that she had a very dark, black stool; but no obvious frank blood in her stool.  She denies any abdominal discomfort whatsoever.  She denies any other new pains.  She denies any recent fevers or chills.  Patient also denies any shortness of breath whatsoever.  Exam today reveals abdomen soft and nontender.  Patient does appear slightly pale.  Labs obtained today revealed that hemoglobin has actually increased from 8.6, up to 8.7.  Patient states that she has an appointment with Dr. Paulita Fujita Tuesday, 05/06/2016.  Reviewed all findings with Dr. Burr Medico; and she suggested that patient keep her appointment with Dr. Paulita Fujita as planned for next week.  We'll recheck patient's lab work on Thursday, 05/01/2016 as well.  Patient was advised to call/return or go directly to the emergency department for any acute increase in her bleeding.  Also, this provider will contact Dr. Paulita Fujita via in basket to see if he could see the patient any sooner.  Carcinoma of body of pancreas [facility]) Patient is currently undergoing observation only for treatment of her pancreatic cancer.  She is scheduled to return on 05/01/2016 for labs.  She is scheduled to return on 05/20/2016 for labs and a follow-up visit.   Patient stated understanding of all instructions; and was in agreement with this plan of care. The patient knows to call the clinic with any problems, questions or concerns.   Total time spent with patient was 25 minutes;  with greater than 75 percent of that time spent in face to face counseling regarding patient's symptoms,  and coordination of care and follow up.  Disclaimer:This dictation was prepared with Dragon/digital dictation along with [employer]. Any transcriptional errors that result from this process are unintentional.  Drue Second,  NP 05/01/2016

## 2016-05-01 NOTE — Assessment & Plan Note (Addendum)
Patient has a history of chronic, intermittent GI bleeding.  She has been diagnosed in the past with a DVT; and has intermittently been on anticoagulation the past-but this apparently increased issues with the GI bleeding and patient made the decision to hold any further anticoagulation in the future-despite the increased risk for new blood clots.  Patient states that her hemoglobin had decreased down to 8.6; and patient received 2 units of packed red blood cells transfusion on 04/21/2016.  She noticed approximately 24 hours ago that she had a very dark, black stool; but no obvious frank blood in her stool.  She denies any abdominal discomfort whatsoever.  She denies any other new pains.  She denies any recent fevers or chills.  Patient also denies any shortness of breath whatsoever.  Exam today reveals abdomen soft and nontender.  Patient does appear slightly pale.  Labs obtained today revealed that hemoglobin has actually increased from 8.6, up to 8.7.  Patient states that she has an appointment with Dr. Paulita Fujita Tuesday, 05/06/2016.  Reviewed all findings with Dr. Burr Medico; and she suggested that patient keep her appointment with Dr. Paulita Fujita as planned for next week.  We'll recheck patient's lab work on Thursday, 05/01/2016 as well.  Patient was advised to call/return or go directly to the emergency department for any acute increase in her bleeding.  Also, this provider will contact Dr. Paulita Fujita via in basket to see if he could see the patient any sooner.

## 2016-05-01 NOTE — Telephone Encounter (Signed)
Called to review patient's labs with her today.  Hemoglobin has actually improved from 8.7 up to 8.8 today.  Patient states that yesterday she noticed a another repeat black stool.  She has had no bowel movements today.  She's noticed no further bleeding issues.  Patient has no other new symptoms today as well.  She states that her appointment with Dr. Paulita Fujita.  Gastroenterologist has been changed now from January 2 to 05/08/2016.  Will review all with Dr.Feng; and Marijean Bravo all chart notes to Dr. Paulita Fujita as well.  Patient was once again reminded to call/return or go directly to the emergency department for any worsening symptoms whatsoever.

## 2016-05-19 NOTE — Progress Notes (Signed)
Nicole Sherman  Telephone:([phone]) [phone] Fax:([phone]) [phone]  Clinic Follow Up Note   Patient Care Team: Hulan Fess, MD as PCP - General (Family Medicine) [name], MD as Consulting Physician (Hematology) Arta Silence, MD as Consulting Physician (Gastroenterology) 05/20/2016  CHIEF COMPLAINTS:  Follow up unresectable pancreatic adenocarcinoma    OTHER RELATED ISSUES 1. Left LE DVT diagnosed on 06/09/2015, started on lovenox and bridged to coumadin, which was held on 10/22/2068 due to GI bleeding. Changed to Xarelto on 10/31/2015, held due to GI bleeding   Oncology History   Pancreatic cancer [facility])   Staging form: Pancreas, AJCC 7th Edition     Clinical stage from 05/27/2014: Stage IIB (T2, N1, M0) - Signed by [name], MD on 05/31/2015       Carcinoma of body of pancreas ([place])   05/16/2015 Imaging    CT chest, abdomen and pelvis with contrast showed a bulky mass at pancreatic neck/body, tumor encases the celiac and proximal branches and a cruise the main portal vein, peripancreatic adenopathy. no distant mets.       05/28/2015 Initial Biopsy    Pancreatic mass needle biopsy showed well differentiated adenocarcinoma      05/31/2015 Initial Diagnosis    Pancreatic cancer ([place])      05/31/2015 Tumor Marker    CA19.9 =375      06/05/2015 - 07/03/2015 Chemotherapy    weekly gemcitabine 1026m/m2, dose reduced to 8535mm2 on week 2 due to tolerance issue      08/27/2015 - 10/30/2015 Chemotherapy    xeloda 100073mid, 14 days on, 7 days off, stopped due to disease progression       11/22/2015 - 12/05/2015 Radiation Therapy    Radiation to her pancreatic cancer       02/28/2016 Imaging    CT c/a/p with contrast IMPRESSION: 1. Decreased size of an infiltrative pancreatic head and neck mass, consistent with adenocarcinoma. Improvement in biliary duct dilatation. 2. No evidence of metastatic disease. 3. Similar arterial and venous involvement with  secondary gastroepiploic collaterals and gastric varices. 4. Similar small volume abdominal pelvic ascites. 5.  No acute process or evidence of metastatic disease in the chest. 6.  Coronary artery atherosclerosis. Aortic atherosclerosis. 7. Suspect gastric antral wall thickening, accentuated by underdistention. This may relate to radiation induced gastritis.       HISTORY OF PRESENTING ILLNESS:  Nicole Sherman 83o. female is here because of her recent abnormal CT scan, which showed a pink vaginal mass, highly suspicious for pancreatic cancer.  She has been haivng epigastric pain after meal for 4-5 months. She also reports left side pain at night when she sleeps on left side, mild back pain, the pain is worse lately, lasts longer especially afte rdinner, it about 5/10, appetite is lower than before, she lost aobut 10-20lbs in the past 5 months. No other complains, she had loose BM 1-2 time BM for 8-10 months, no hematochezia or melena. She was evaluated by her primary care physician Dr. LitRex Krasbdominal ultrasound was negative on 05/04/2015. She underwent a CT abdomen and pelvis with contrast on 05/16/2015, which showed a 4 x 1 cm mass in pancreatic body and a neck, tumor encases the distal celiac and proximal branches and occludes the main portal vein. No distant metastasis on the CT scan. She was referred to us [facility] further workup.  She has good energy level, she is able to do all house work. She has intermittent dizziness from blood pressure meds. No  other complains.   CURRENT THERAPY: supportive care   INTERIM HISTORY: Nicole Sherman returns for follow up as scheduled. She has been doing well. Her bowel movements have been normal. She hasn't had blood in her stool in the past week. She went to see Dr. Paulita Fujita for this. Her family believes that she looks pale today. She cooks dinner every night and does some laundry, but doesn't do as much as she use to. She is very tired. She has not been taking her  iron pill, but says that she will start taking them again. She is having problems with constipation and has to take medication to have a bowel movement. She has tried MiraLax 1x day and it did not work. She still has pain that has now moved to her back. It comes and goes and she ranks it as a 5/10. She takes 3 to 4 hydrocodone a day. Sometimes she thinks she needs another, but doesn't want to take too much. When she takes the medication, it takes a while for the pain to go away. She has been very tired and sleeps a lot. Her appetite is good. Denies any other complaints.   MEDICAL HISTORY:  Past Medical History:  Diagnosis Date  . Arthritis    osteoarthritis-hands wrist  . Cancer ([place])    pancreatic mass- dx. adenocarcinoma" CT abdomen 05-16-15  . Diabetes mellitus without complication ([place])   . Diverticulosis    showing on CT of abdomen 05-16-15  . Hypertension   . Hypothyroidism     SURGICAL HISTORY: Past Surgical History:  Procedure Laterality Date  . ANKLE SURGERY Right   . APPENDECTOMY     open  . CATARACT EXTRACTION Right   . EUS N/A 05/28/2015   Procedure: UPPER ENDOSCOPIC ULTRASOUND (EUS) LINEAR;  Surgeon: Arta Silence, MD;  Location: [facility] ENDOSCOPY;  Service: Endoscopy;  Laterality: N/A;  . EYE SURGERY     macular hole surgery repair  . JOINT REPLACEMENT Right   . SHOULDER ARTHROSCOPY W/ ROTATOR CUFF REPAIR Left   . SHOULDER OPEN ROTATOR CUFF REPAIR Right   . TUBAL LIGATION      SOCIAL HISTORY: Social History   Social History  . Marital Status: Married    Spouse Name: N/A  . Number of Children: 2 daughters   . Years of Education: N/A   Occupational History  . Not on file.   Social History Main Topics  . Smoking status: Former Smoker -- 1.00 packs/day for 30 years    Types: Cigarettes    Quit date: 05/21/1986  . Smokeless tobacco: Not on file  . Alcohol Use: No  . Drug Use: No  . Sexual Activity: Not on file   Other Topics Concern  . Not on file   Social  History Narrative    FAMILY HISTORY: Family History  Problem Relation Age of Onset  . Stroke Mother   . Cancer Sister     lung cancer     ALLERGIES:  is allergic to keflex [cephalexin] and aspirin.  MEDICATIONS:  Current Outpatient Prescriptions  Medication Sig Dispense Refill  . docusate sodium (COLACE) 100 MG capsule Take 100 mg by mouth 3 (three) times daily.    Marland Kitchen HYDROcodone-acetaminophen (NORCO) 7.5-325 MG tablet Take 1 tablet by mouth every 6 (six) hours as needed for moderate pain. 90 tablet 0  . levothyroxine (SYNTHROID, LEVOTHROID) 100 MCG tablet Take 100 mcg by mouth daily before breakfast.     . metFORMIN (GLUCOPHAGE) 1000 MG tablet Take 1,000  mg by mouth 2 (two) times daily with a meal.     . omeprazole (PRILOSEC OTC) 20 MG tablet Take 20 mg by mouth daily as needed (acid reflux). Reported on 11/23/2015    . ondansetron (ZOFRAN) 8 MG tablet TAKE ONE TABLET BY MOUTH TWICE DAILY AS NEEDED FOR NAUSEA AND VOMITING (Patient not taking: Reported on 04/29/2016) 30 tablet 0  . prochlorperazine (COMPAZINE) 5 MG tablet Take 1-2 tablets (5-10 mg total) by mouth every 8 (eight) hours as needed for nausea or vomiting. (Patient not taking: Reported on 04/29/2016) 30 tablet 2   No current facility-administered medications for this visit.     REVIEW OF SYSTEMS: Constitutional: Denies fevers, chills or abnormal night sweats. (+) Fatigue (+) Depressed Eyes: Denies blurriness of vision, double vision or watery eyes Ears, nose, mouth, throat, and face: Denies mucositis or sore throat Respiratory: Denies cough, dyspnea or wheezes Cardiovascular: Denies palpitation, chest discomfort or lower extremity swelling Gastrointestinal:  Denies nausea, heartburn or change in bowel habits (+) constipation (+) R flank pain Skin: Denies abnormal skin rashes. Lymphatics: Denies new lymphadenopathy or easy bruising Neurological:Denies numbness, tingling or new weaknesses Behavioral/Psych: Mood is stable,  no new changes  All other systems were reviewed with the patient and are negative.  PHYSICAL EXAMINATION: ECOG PERFORMANCE STATUS: 1-2  Vitals:   05/20/16 1411  BP: 140/70  Pulse: 81  Resp: 18  Temp: 98.4 F (36.9 C)   Filed Weights   05/20/16 1411  Weight: 151 lb 14.4 oz (68.9 kg)   GENERAL:alert, oriented, appears to be pale  SKIN: skin color, texture, turgor are normal EYES: normal, conjunctiva are pink and non-injected, sclera clear OROPHARYNX:no exudate, no erythema and lips, buccal mucosa, and tongue normal  NECK: supple, thyroid normal size, non-tender, without nodularity LYMPH:  no palpable lymphadenopathy in the cervical, axillary or inguinal LUNGS: clear to auscultation and percussion with normal breathing effort HEART: regular rate & rhythm and no murmurs and no lower extremity edema ABDOMEN:abdomen soft, non-tender and normal bowel sounds Musculoskeletal:no cyanosis of digits and no clubbing  PSYCH: alert & oriented x 3 with fluent speech NEURO: no focal motor/sensory deficits  LABORATORY DATA:  I have reviewed the data as listed  CBC Latest Ref Rng & Units 05/20/2016 05/01/2016 04/29/2016  WBC 3.9 - 10.3 10e3/uL 3.5(L) 4.3 3.7(L)  Hemoglobin 11.6 - 15.9 g/dL 7.6(L) 8.8(L) 8.7(L)  Hematocrit 34.8 - 46.6 % 24.1(L) 26.5(L) 26.5(L)  Platelets 145 - 400 10e3/uL 181 169 160   CMP Latest Ref Rng & Units 05/20/2016 04/29/2016 04/17/2016  Glucose 70 - 140 mg/dl 158(H) 172(H) 133  BUN 7.0 - 26.0 mg/dL 21.1 25.0 16.1  Creatinine 0.6 - 1.1 mg/dL 1.2(H) 1.2(H) 1.0  Sodium 136 - 145 mEq/L 139 142 140  Potassium 3.5 - 5.1 mEq/L 3.7 3.8 4.0  Chloride 101 - 111 mmol/L - - -  CO2 22 - 29 mEq/L 23 25 24   Calcium 8.4 - 10.4 mg/dL 8.9 9.0 9.3  Total Protein 6.4 - 8.3 g/dL 6.6 6.5 7.1  Total Bilirubin 0.20 - 1.20 mg/dL 0.30 0.30 0.32  Alkaline Phos 40 - 150 U/L 66 61 71  AST 5 - 34 U/L 21 25 22   ALT 0 - 55 U/L 13 20 17    Ferritin 10 ng/ml today   Protime w/INR 10/29/15:  14.4/1.20  CA19.9 u/ML 05/31/2015: 751 07/03/2015: 227 07/19/2015: 159 08/23/2015: 304 09/03/2015: 309 10/15/2015: 357 12/31/2015: 121 01/30/2016: 77 02/28/2016: 51 04/17/16: 39  PATHOLOGY REPORT  Diagnosis 05/28/2015 FINE  NEEDLE ASPIRATION: NEEDLE ASPIRATION, PANCREAS BODY (SPECIMEN 1 OF 1 COLLECTED 05/28/15): WELL DIFFERENTIATED ADENOCARCINOMA. Preliminary Diagnosis Intraoperative Diagnosis: Adequate (JDP)   RADIOGRAPHIC STUDIES: I have personally reviewed the radiological images as listed and agreed with the findings in the report. No results found.  ASSESSMENT & PLAN:  83 y.o.Caucasian female, presented with intermittent abdominal pain, weight loss, and a CT finding of a pancreatic mass.  1. Pancreatic body/head adenocarcinoma, well differentiated, cT2N1M0, stage IIB, unresectable -I previously reviewed her CT chest results, which was negative for metastatic disease. -The biopsy results was previously reviewed with her and her family members in detail. -Her case was previously discussed in our GI tumor Board, Dr. Barry Dienes felt this is not resectable disease. -I previously reviewed the nature history of pancreatic cancer, which is aggressive. Her disease is incurable at this stage, and the goal of therapy is palliative and prolong her life. -she tolerated first line chemo gemcitabine poorly, with diarrhea, poor appetite, and skin rash, and was switched to second line Xeloda. - I previously reviewed her restaging CT scan from 6/26, which unfortunately showed disease progression in pancreas, no other metastasis.  -She has completed radiation. CA19.9 has decreased significantly after radiation, indicating good response. -I have requested MSI test on her tumor tissue to see if she is a candidate for immunotherapy, unfortunately there was not enough tissue for the test. -I previously reviewed her restaging CT scan from 02/28/2016, which showed decreased size of the pancreatic mass, no liver or  other metastasis. -she has had recurrent GI bleeding, ? pancratic mass invading duodenum and causes bleeding  -She otherwise is clinically stable, we'll continue observation  2. Left LE DVT -Probably provoked by her underlying malignancy and chemotherapy -I recommended anticoagulation indefinitely, giving her incurable malignancy, if no contraindications such as bleeding. -She has had recurrent GI bleeding, Xarelto has been stopped   3. Fatigue, anorexia, and abdominal pain -improved some, she'll continue tramadol and Vicodin as needed.  4.HTN, DM, hypothyroidism  -follow up with PCP   5. Anemia -Hemoglobin has steadily decreased since July 2017. 7.6 today and the patient is symptomatic with fatigue.  -I recommend participation for symptomatic anemia. She declined. She wants to try iron -Her repeated ferritin showed no iron level, I'll set up IV Feraheme infusion twice asap  -she has not been taking an iron pill, but says she will start taking it again.   6. Depression  -We discussed the patient feeling depressed by many factors; the cold weather and her cancer diagnosis. -We discussed she is doing well from her cancer standpoint. I encouraged her to be positive. -We discussed antidepressant medication, she has respectfully declined for now.   7. Goal of care -We discussed her goal of care is palliative, giving the unresectable pancreatic cancer -Due to her advanced age and poor tolerance to chemotherapy, we'll continue palliative care alone. -We previously discussed hospice, she is not ready for it.  8. Constipation -I have encouraged her to start Senna or increase her dosage of MiraLax. -We discussed drinking plenty of water and eating vegetables.   Plan  -lab reviewed  -continue supportive care -Increased hydrocodone dosage to 7.5 mg.  -I will schedule her for IV iron this week or next.  -She will schedule another appointment with Dr. Paulita Fujita after her next visit with me.    -I will see her back in 2 weeks for follow up and lab   All questions were answered. The patient knows to call the clinic with  any problems, questions or concerns.  I spent 20 minutes counseling the patient face to face. The total time spent in the appointment was 30 minutes and more than 50% was on counseling.  This document serves as a record of services personally performed by [name], MD. It was created on her behalf by Martinique Casey, a trained medical scribe. The creation of this record is based on the scribe's personal observations and the provider's statements to them. This document has been checked and approved by the attending provider.  [name], MD 05/20/2016

## 2016-05-20 ENCOUNTER — Telehealth: Payer: Self-pay | Admitting: Hematology

## 2016-05-20 ENCOUNTER — Ambulatory Visit: Payer: Medicare Other

## 2016-05-20 ENCOUNTER — Ambulatory Visit (HOSPITAL_BASED_OUTPATIENT_CLINIC_OR_DEPARTMENT_OTHER): Payer: Medicare Other | Admitting: Hematology

## 2016-05-20 ENCOUNTER — Other Ambulatory Visit (HOSPITAL_BASED_OUTPATIENT_CLINIC_OR_DEPARTMENT_OTHER): Payer: Medicare Other

## 2016-05-20 ENCOUNTER — Encounter: Payer: Self-pay | Admitting: Hematology

## 2016-05-20 VITALS — BP 140/70 | HR 81 | Temp 98.4°F | Resp 18 | Ht 66.5 in | Wt 151.9 lb

## 2016-05-20 DIAGNOSIS — E039 Hypothyroidism, unspecified: Secondary | ICD-10-CM

## 2016-05-20 DIAGNOSIS — E119 Type 2 diabetes mellitus without complications: Secondary | ICD-10-CM | POA: Diagnosis not present

## 2016-05-20 DIAGNOSIS — Z86718 Personal history of other venous thrombosis and embolism: Secondary | ICD-10-CM

## 2016-05-20 DIAGNOSIS — D630 Anemia in neoplastic disease: Secondary | ICD-10-CM

## 2016-05-20 DIAGNOSIS — C259 Malignant neoplasm of pancreas, unspecified: Secondary | ICD-10-CM

## 2016-05-20 DIAGNOSIS — C250 Malignant neoplasm of head of pancreas: Secondary | ICD-10-CM

## 2016-05-20 DIAGNOSIS — C251 Malignant neoplasm of body of pancreas: Secondary | ICD-10-CM

## 2016-05-20 DIAGNOSIS — I10 Essential (primary) hypertension: Secondary | ICD-10-CM | POA: Diagnosis not present

## 2016-05-20 DIAGNOSIS — I824Z2 Acute embolism and thrombosis of unspecified deep veins of left distal lower extremity: Secondary | ICD-10-CM

## 2016-05-20 DIAGNOSIS — N899 Noninflammatory disorder of vagina, unspecified: Secondary | ICD-10-CM

## 2016-05-20 LAB — CBC WITH DIFFERENTIAL/PLATELET
BASO%: 0.6 % (ref 0.0–2.0)
Basophils Absolute: 0 10*3/uL (ref 0.0–0.1)
EOS%: 2.6 % (ref 0.0–7.0)
Eosinophils Absolute: 0.1 10*3/uL (ref 0.0–0.5)
HCT: 24.1 % — ABNORMAL LOW (ref 34.8–46.6)
HGB: 7.6 g/dL — ABNORMAL LOW (ref 11.6–15.9)
LYMPH#: 0.9 10*3/uL (ref 0.9–3.3)
LYMPH%: 24.5 % (ref 14.0–49.7)
MCH: 29.9 pg (ref 25.1–34.0)
MCHC: 31.5 g/dL (ref 31.5–36.0)
MCV: 94.9 fL (ref 79.5–101.0)
MONO#: 0.3 10*3/uL (ref 0.1–0.9)
MONO%: 8.6 % (ref 0.0–14.0)
NEUT%: 63.7 % (ref 38.4–76.8)
NEUTROS ABS: 2.2 10*3/uL (ref 1.5–6.5)
Platelets: 181 10*3/uL (ref 145–400)
RBC: 2.54 10*6/uL — AB (ref 3.70–5.45)
RDW: 15 % — ABNORMAL HIGH (ref 11.2–14.5)
WBC: 3.5 10*3/uL — AB (ref 3.9–10.3)

## 2016-05-20 LAB — COMPREHENSIVE METABOLIC PANEL
ALT: 13 U/L (ref 0–55)
AST: 21 U/L (ref 5–34)
Albumin: 3.4 g/dL — ABNORMAL LOW (ref 3.5–5.0)
Alkaline Phosphatase: 66 U/L (ref 40–150)
Anion Gap: 8 mEq/L (ref 3–11)
BUN: 21.1 mg/dL (ref 7.0–26.0)
CHLORIDE: 108 meq/L (ref 98–109)
CO2: 23 meq/L (ref 22–29)
CREATININE: 1.2 mg/dL — AB (ref 0.6–1.1)
Calcium: 8.9 mg/dL (ref 8.4–10.4)
EGFR: 43 mL/min/{1.73_m2} — ABNORMAL LOW (ref >90)
GLUCOSE: 158 mg/dL — AB (ref 70–140)
Potassium: 3.7 mEq/L (ref 3.5–5.1)
Sodium: 139 mEq/L (ref 136–145)
Total Bilirubin: 0.3 mg/dL (ref 0.20–1.20)
Total Protein: 6.6 g/dL (ref 6.4–8.3)

## 2016-05-20 LAB — FERRITIN: Ferritin: 10 ng/ml (ref 9–269)

## 2016-05-20 MED ORDER — HYDROCODONE-ACETAMINOPHEN 7.5-325 MG PO TABS: 1.0000 | ORAL_TABLET | Freq: Four times a day (QID) | ORAL | 0 refills | Status: DC | PRN

## 2016-05-20 MED FILL — HYDROCODON-APAP 7.5-325: 7.5-325 | 22 days supply | Qty: 90 | Fill #0

## 2016-05-20 NOTE — Telephone Encounter (Signed)
Appointments scheduled per 1/16 LOS. Patient requested to have late afternoon appointment instead of AM. Scheduled for 1/29 to accommodate wish to have afternoon appointment.

## 2016-05-21 LAB — CANCER ANTIGEN 19-9: CA 19-9: 39 U/mL — ABNORMAL HIGH (ref 0–35)

## 2016-05-26 ENCOUNTER — Other Ambulatory Visit: Payer: Self-pay | Admitting: Hematology

## 2016-05-26 ENCOUNTER — Ambulatory Visit (HOSPITAL_BASED_OUTPATIENT_CLINIC_OR_DEPARTMENT_OTHER): Payer: Medicare Other

## 2016-05-26 VITALS — BP 137/75 | HR 63 | Temp 98.6°F | Resp 17

## 2016-05-26 DIAGNOSIS — D509 Iron deficiency anemia, unspecified: Secondary | ICD-10-CM

## 2016-05-26 DIAGNOSIS — D500 Iron deficiency anemia secondary to blood loss (chronic): Secondary | ICD-10-CM

## 2016-05-26 MED ORDER — SODIUM CHLORIDE 0.9 % IV SOLN
Freq: Once | INTRAVENOUS | Status: AC
  Administered 2016-05-26: 08:00:00 via INTRAVENOUS

## 2016-05-26 MED ORDER — SODIUM CHLORIDE 0.9 % IV SOLN
510.0000 mg | Freq: Once | INTRAVENOUS | Status: AC
  Administered 2016-05-26: 510 mg via INTRAVENOUS
  Filled 2016-05-26: qty 17

## 2016-05-26 NOTE — Patient Instructions (Signed)
Ferumoxytol injection What is this medicine? FERUMOXYTOL is an iron complex. Iron is used to make healthy red blood cells, which carry oxygen and nutrients throughout the body. This medicine is used to treat iron deficiency anemia in people with chronic kidney disease. COMMON BRAND NAME(S): Feraheme What should I tell my health care provider before I take this medicine? They need to know if you have any of these conditions: -anemia not caused by low iron levels -high levels of iron in the blood -magnetic resonance imaging (MRI) test scheduled -an unusual or allergic reaction to iron, other medicines, foods, dyes, or preservatives -pregnant or trying to get pregnant -breast-feeding How should I use this medicine? This medicine is for injection into a vein. It is given by a health care professional in a hospital or clinic setting. Talk to your pediatrician regarding the use of this medicine in children. Special care may be needed. What if I miss a dose? It is important not to miss your dose. Call your doctor or health care professional if you are unable to keep an appointment. What may interact with this medicine? This medicine may interact with the following medications: -other iron products What should I watch for while using this medicine? Visit your doctor or healthcare professional regularly. Tell your doctor or healthcare professional if your symptoms do not start to get better or if they get worse. You may need blood work done while you are taking this medicine. You may need to follow a special diet. Talk to your doctor. Foods that contain iron include: whole grains/cereals, dried fruits, beans, or peas, leafy green vegetables, and organ meats (liver, kidney). What side effects may I notice from receiving this medicine? Side effects that you should report to your doctor or health care professional as soon as possible: -allergic reactions like skin rash, itching or hives, swelling of the  face, lips, or tongue -breathing problems -changes in blood pressure -feeling faint or lightheaded, falls -fever or chills -flushing, sweating, or hot feelings -swelling of the ankles or feet Side effects that usually do not require medical attention (report to your doctor or health care professional if they continue or are bothersome): -diarrhea -headache -nausea, vomiting -stomach pain Where should I keep my medicine? This drug is given in a hospital or clinic and will not be stored at home.  2017 Elsevier/Gold Standard (2015-05-24 12:41:49)  

## 2016-05-30 ENCOUNTER — Other Ambulatory Visit: Payer: Self-pay | Admitting: Nurse Practitioner

## 2016-05-30 ENCOUNTER — Telehealth: Payer: Self-pay | Admitting: *Deleted

## 2016-05-30 DIAGNOSIS — N390 Urinary tract infection, site not specified: Secondary | ICD-10-CM

## 2016-05-30 MED ORDER — CIPROFLOXACIN HCL 500 MG PO TABS: 500.0000 mg | ORAL_TABLET | Freq: Two times a day (BID) | ORAL | 0 refills | Status: DC

## 2016-05-30 NOTE — Telephone Encounter (Signed)
Called patient to review her UTI symptoms.  She states she's had some dysuria and frequency of urination recently.  She denies any other new symptoms.  She denies any recent fevers or chills.  Confirm the patient is only allergic to Keflex and aspirin.  Will prescribe Cipro 500 mg twice daily for treatment of clinical signs and symptoms of urinary tract infection.  Also, reviewed all with Dr.Feng as well.   Patient was advised to call/return or go directly to the emergency department over the weekend for any worsening symptoms whatsoever.  Of note-patient states she has an appointment at the [facility] this coming Monday, 06/02/2016 as well.

## 2016-05-30 NOTE — Telephone Encounter (Signed)
Daughter, Lurline Del states they think patient might have a UTI. Wants to know if medicine can be called in or does she need to be seen.

## 2016-06-02 ENCOUNTER — Telehealth: Payer: Self-pay | Admitting: Hematology

## 2016-06-02 ENCOUNTER — Ambulatory Visit (HOSPITAL_BASED_OUTPATIENT_CLINIC_OR_DEPARTMENT_OTHER): Payer: Medicare Other

## 2016-06-02 ENCOUNTER — Other Ambulatory Visit (HOSPITAL_BASED_OUTPATIENT_CLINIC_OR_DEPARTMENT_OTHER): Payer: Medicare Other

## 2016-06-02 ENCOUNTER — Ambulatory Visit (HOSPITAL_BASED_OUTPATIENT_CLINIC_OR_DEPARTMENT_OTHER): Payer: Medicare Other | Admitting: Hematology

## 2016-06-02 VITALS — BP 125/78 | HR 61 | Temp 98.7°F | Resp 16

## 2016-06-02 VITALS — BP 123/72 | HR 71 | Temp 98.1°F | Resp 17 | Ht 66.5 in | Wt 150.9 lb

## 2016-06-02 DIAGNOSIS — I959 Hypotension, unspecified: Secondary | ICD-10-CM

## 2016-06-02 DIAGNOSIS — R109 Unspecified abdominal pain: Secondary | ICD-10-CM

## 2016-06-02 DIAGNOSIS — Z86718 Personal history of other venous thrombosis and embolism: Secondary | ICD-10-CM

## 2016-06-02 DIAGNOSIS — D509 Iron deficiency anemia, unspecified: Secondary | ICD-10-CM

## 2016-06-02 DIAGNOSIS — C251 Malignant neoplasm of body of pancreas: Secondary | ICD-10-CM

## 2016-06-02 DIAGNOSIS — Y92009 Unspecified place in unspecified non-institutional (private) residence as the place of occurrence of the external cause: Secondary | ICD-10-CM

## 2016-06-02 DIAGNOSIS — N3001 Acute cystitis with hematuria: Secondary | ICD-10-CM

## 2016-06-02 DIAGNOSIS — Z7901 Long term (current) use of anticoagulants: Secondary | ICD-10-CM

## 2016-06-02 DIAGNOSIS — I10 Essential (primary) hypertension: Secondary | ICD-10-CM | POA: Diagnosis not present

## 2016-06-02 DIAGNOSIS — D500 Iron deficiency anemia secondary to blood loss (chronic): Secondary | ICD-10-CM

## 2016-06-02 DIAGNOSIS — E119 Type 2 diabetes mellitus without complications: Secondary | ICD-10-CM | POA: Diagnosis not present

## 2016-06-02 DIAGNOSIS — W19XXXD Unspecified fall, subsequent encounter: Secondary | ICD-10-CM

## 2016-06-02 LAB — COMPREHENSIVE METABOLIC PANEL
ALBUMIN: 3.6 g/dL (ref 3.5–5.0)
ALK PHOS: 66 U/L (ref 40–150)
ALT: 13 U/L (ref 0–55)
ANION GAP: 10 meq/L (ref 3–11)
AST: 17 U/L (ref 5–34)
BILIRUBIN TOTAL: 0.27 mg/dL (ref 0.20–1.20)
BUN: 17.9 mg/dL (ref 7.0–26.0)
CALCIUM: 9.3 mg/dL (ref 8.4–10.4)
CO2: 26 mEq/L (ref 22–29)
CREATININE: 1.4 mg/dL — AB (ref 0.6–1.1)
Chloride: 104 mEq/L (ref 98–109)
EGFR: 36 mL/min/{1.73_m2} — ABNORMAL LOW (ref >90)
Glucose: 154 mg/dl — ABNORMAL HIGH (ref 70–140)
Potassium: 4 mEq/L (ref 3.5–5.1)
Sodium: 140 mEq/L (ref 136–145)
TOTAL PROTEIN: 6.9 g/dL (ref 6.4–8.3)

## 2016-06-02 LAB — URINALYSIS, MICROSCOPIC - CHCC
GLUCOSE UR CHCC: NEGATIVE mg/dL
PH: 6 (ref 4.6–8.0)
Specific Gravity, Urine: 1.03 (ref 1.003–1.035)

## 2016-06-02 LAB — CBC WITH DIFFERENTIAL/PLATELET
BASO%: 0.2 % (ref 0.0–2.0)
Basophils Absolute: 0 10*3/uL (ref 0.0–0.1)
EOS%: 1.8 % (ref 0.0–7.0)
Eosinophils Absolute: 0.1 10*3/uL (ref 0.0–0.5)
HEMATOCRIT: 26.4 % — AB (ref 34.8–46.6)
HEMOGLOBIN: 8.2 g/dL — AB (ref 11.6–15.9)
LYMPH#: 0.8 10*3/uL — AB (ref 0.9–3.3)
LYMPH%: 16.4 % (ref 14.0–49.7)
MCH: 30.7 pg (ref 25.1–34.0)
MCHC: 31.1 g/dL — ABNORMAL LOW (ref 31.5–36.0)
MCV: 98.9 fL (ref 79.5–101.0)
MONO#: 0.6 10*3/uL (ref 0.1–0.9)
MONO%: 11.9 % (ref 0.0–14.0)
NEUT%: 69.7 % (ref 38.4–76.8)
NEUTROS ABS: 3.4 10*3/uL (ref 1.5–6.5)
PLATELETS: 177 10*3/uL (ref 145–400)
RBC: 2.67 10*6/uL — ABNORMAL LOW (ref 3.70–5.45)
RDW: 17.2 % — AB (ref 11.2–14.5)
WBC: 4.9 10*3/uL (ref 3.9–10.3)

## 2016-06-02 MED ORDER — SODIUM CHLORIDE 0.9 % IV SOLN
Freq: Once | INTRAVENOUS | Status: AC
  Administered 2016-06-02: 15:00:00 via INTRAVENOUS

## 2016-06-02 MED ORDER — FERUMOXYTOL INJECTION 510 MG/17 ML
510.0000 mg | Freq: Once | INTRAVENOUS | Status: AC
  Administered 2016-06-02: 510 mg via INTRAVENOUS
  Filled 2016-06-02: qty 17

## 2016-06-02 NOTE — Telephone Encounter (Signed)
Appointments scheduled per 06/02/16 los. Patient was given a copy of the appointment schedule and AVS report per 06/02/16 los. °

## 2016-06-02 NOTE — Progress Notes (Signed)
[place]  [phone] [phone]  Clinic Follow Up Note   Patient Care Team: Hulan Fess, MD as PCP - General (Family Medicine) [name], MD as Consulting Physician (Hematology) Arta Silence, MD as Consulting Physician (Gastroenterology) 06/02/2016  CHIEF COMPLAINTS:  Follow up unresectable pancreatic adenocarcinoma    OTHER RELATED ISSUES 1. Left LE DVT diagnosed on 06/09/2015, started on lovenox and bridged to coumadin, which was held on 10/22/2068 due to GI bleeding. Changed to Xarelto on 10/31/2015, held due to GI bleeding   Oncology History   Pancreatic cancer [facility])   Staging form: Pancreas, AJCC 7th Edition     Clinical stage from 05/27/2014: Stage IIB (T2, N1, M0) - Signed by [name], MD on 05/31/2015       Carcinoma of body of pancreas ([place])   05/16/2015 Imaging    CT chest, abdomen and pelvis with contrast showed a bulky mass at pancreatic neck/body, tumor encases the celiac and proximal branches and a cruise the main portal vein, peripancreatic adenopathy. no distant mets.       05/28/2015 Initial Biopsy    Pancreatic mass needle biopsy showed well differentiated adenocarcinoma      05/31/2015 Initial Diagnosis    Pancreatic cancer ([place])      05/31/2015 Tumor Marker    CA19.9 =375      06/05/2015 - 07/03/2015 Chemotherapy    weekly gemcitabine 1044m/m2, dose reduced to 8575mm2 on week 2 due to tolerance issue      08/27/2015 - 10/30/2015 Chemotherapy    xeloda 100081mid, 14 days on, 7 days off, stopped due to disease progression       11/22/2015 - 12/05/2015 Radiation Therapy    Radiation to her pancreatic cancer       02/28/2016 Imaging    CT c/a/p with contrast IMPRESSION: 1. Decreased size of an infiltrative pancreatic head and neck mass, consistent with adenocarcinoma. Improvement in biliary duct dilatation. 2. No evidence of metastatic disease. 3. Similar arterial and venous involvement with  secondary gastroepiploic collaterals and gastric varices. 4. Similar small volume abdominal pelvic ascites. 5.  No acute process or evidence of metastatic disease in the chest. 6.  Coronary artery atherosclerosis. Aortic atherosclerosis. 7. Suspect gastric antral wall thickening, accentuated by underdistention. This may relate to radiation induced gastritis.       HISTORY OF PRESENTING ILLNESS:  Nicole Sherman 83o. female is here because of her recent abnormal CT scan, which showed a pink vaginal mass, highly suspicious for pancreatic cancer.  She has been haivng epigastric pain after meal for 4-5 months. She also reports left side pain at night when she sleeps on left side, mild back pain, the pain is worse lately, lasts longer especially afte rdinner, it about 5/10, appetite is lower than before, she lost aobut 10-20lbs in the past 5 months. No other complains, she had loose BM 1-2 time BM for 8-10 months, no hematochezia or melena. She was evaluated by her primary care physician Dr. LitRex Krasbdominal ultrasound was negative on 05/04/2015. She underwent a CT abdomen and pelvis with contrast on 05/16/2015, which showed a 4 x 1 cm mass in pancreatic body and a neck, tumor encases the distal celiac and proximal branches and occludes the main portal vein. No distant metastasis on the CT scan. She was referred to us [place] further workup.  She has good energy level, she is able to do all house work. She has intermittent dizziness from blood pressure meds. No  other complains.   CURRENT THERAPY: supportive care   INTERIM HISTORY: Korey returns for follow up as scheduled. She called [place] last Friday to report dysuria, no fever or chills. We called in cipro, she has been compliant but reports her dysuria Has been slightly worse. She also reports no abdominal cramps and discomfort. No fever. Her bowel movement has been normal, no recent episodes of rectal bleeding. She tolerated IV Feraheme well last week,  and is here for second dose today. Her husband and the daughter noticed her panel has improved, her energy level has also slightly improved.  MEDICAL HISTORY:  Past Medical History:  Diagnosis Date  . Arthritis    osteoarthritis-hands wrist  . Cancer ([place])    pancreatic mass- dx. adenocarcinoma" CT abdomen 05-16-15  . Diabetes mellitus without complication ([place])   . Diverticulosis    showing on CT of abdomen 05-16-15  . Hypertension   . Hypothyroidism     SURGICAL HISTORY: Past Surgical History:  Procedure Laterality Date  . ANKLE SURGERY Right   . APPENDECTOMY     open  . CATARACT EXTRACTION Right   . EUS N/A 05/28/2015   Procedure: UPPER ENDOSCOPIC ULTRASOUND (EUS) LINEAR;  Surgeon: Arta Silence, MD;  Location: [facility] ENDOSCOPY;  Service: Endoscopy;  Laterality: N/A;  . EYE SURGERY     macular hole surgery repair  . JOINT REPLACEMENT Right   . SHOULDER ARTHROSCOPY W/ ROTATOR CUFF REPAIR Left   . SHOULDER OPEN ROTATOR CUFF REPAIR Right   . TUBAL LIGATION      SOCIAL HISTORY: Social History   Social History  . Marital Status: Married    Spouse Name: N/A  . Number of Children: 2 daughters   . Years of Education: N/A   Occupational History  . Not on file.   Social History Main Topics  . Smoking status: Former Smoker -- 1.00 packs/day for 30 years    Types: Cigarettes    Quit date: 05/21/1986  . Smokeless tobacco: Not on file  . Alcohol Use: No  . Drug Use: No  . Sexual Activity: Not on file   Other Topics Concern  . Not on file   Social History Narrative    FAMILY HISTORY: Family History  Problem Relation Age of Onset  . Stroke Mother   . Cancer Sister     lung cancer     ALLERGIES:  is allergic to keflex [cephalexin] and aspirin.  MEDICATIONS:  Current Outpatient Prescriptions  Medication Sig Dispense Refill  . ciprofloxacin (CIPRO) 500 MG tablet Take 1 tablet (500 mg total) by mouth 2 (two) times daily. 14 tablet 0  . docusate sodium (COLACE) 100  MG capsule Take 100 mg by mouth 3 (three) times daily.    Marland Kitchen HYDROcodone-acetaminophen (NORCO) 7.5-325 MG tablet Take 1 tablet by mouth every 6 (six) hours as needed for moderate pain. 90 tablet 0  . levothyroxine (SYNTHROID, LEVOTHROID) 100 MCG tablet Take 100 mcg by mouth daily before breakfast.     . metFORMIN (GLUCOPHAGE) 1000 MG tablet Take 1,000 mg by mouth 2 (two) times daily with a meal.     . omeprazole (PRILOSEC OTC) 20 MG tablet Take 20 mg by mouth daily as needed (acid reflux). Reported on 11/23/2015    . ondansetron (ZOFRAN) 8 MG tablet TAKE ONE TABLET BY MOUTH TWICE DAILY AS NEEDED FOR NAUSEA AND VOMITING (Patient not taking: Reported on 04/29/2016) 30 tablet 0  . prochlorperazine (COMPAZINE) 5 MG tablet Take 1-2 tablets (5-10 mg  total) by mouth every 8 (eight) hours as needed for nausea or vomiting. (Patient not taking: Reported on 04/29/2016) 30 tablet 2   No current facility-administered medications for this visit.     REVIEW OF SYSTEMS: Constitutional: Denies fevers, chills or abnormal night sweats. (+) Fatigue (+) Depressed Eyes: Denies blurriness of vision, double vision or watery eyes Ears, nose, mouth, throat, and face: Denies mucositis or sore throat Respiratory: Denies cough, dyspnea or wheezes Cardiovascular: Denies palpitation, chest discomfort or lower extremity swelling Gastrointestinal:  Denies nausea, heartburn or change in bowel habits (+) constipation (+) R flank pain Skin: Denies abnormal skin rashes. Lymphatics: Denies new lymphadenopathy or easy bruising Neurological:Denies numbness, tingling or new weaknesses Behavioral/Psych: Mood is stable, no new changes  All other systems were reviewed with the patient and are negative.  PHYSICAL EXAMINATION: ECOG PERFORMANCE STATUS: 2  Vitals:   06/02/16 1358  BP: 123/72  Pulse: 71  Resp: 17  Temp: 98.1 F (36.7 C)   Filed Weights   06/02/16 1358  Weight: 150 lb 14.4 oz (68.4 kg)   GENERAL:alert, oriented,  appears to be pale  SKIN: skin color, texture, turgor are normal EYES: normal, conjunctiva are pink and non-injected, sclera clear OROPHARYNX:no exudate, no erythema and lips, buccal mucosa, and tongue normal  NECK: supple, thyroid normal size, non-tender, without nodularity LYMPH:  no palpable lymphadenopathy in the cervical, axillary or inguinal LUNGS: clear to auscultation and percussion with normal breathing effort HEART: regular rate & rhythm and no murmurs and no lower extremity edema ABDOMEN:abdomen soft, non-tender and normal bowel sounds Musculoskeletal:no cyanosis of digits and no clubbing  PSYCH: alert & oriented x 3 with fluent speech NEURO: no focal motor/sensory deficits  LABORATORY DATA:  I have reviewed the data as listed  CBC Latest Ref Rng & Units 06/02/2016 05/20/2016 05/01/2016  WBC 3.9 - 10.3 10e3/uL 4.9 3.5(L) 4.3  Hemoglobin 11.6 - 15.9 g/dL 8.2(L) 7.6(L) 8.8(L)  Hematocrit 34.8 - 46.6 % 26.4(L) 24.1(L) 26.5(L)  Platelets 145 - 400 10e3/uL 177 181 169   CMP Latest Ref Rng & Units 06/02/2016 05/20/2016 04/29/2016  Glucose 70 - 140 mg/dl 154(H) 158(H) 172(H)  BUN 7.0 - 26.0 mg/dL 17.9 21.1 25.0  Creatinine 0.6 - 1.1 mg/dL 1.4(H) 1.2(H) 1.2(H)  Sodium 136 - 145 mEq/L 140 139 142  Potassium 3.5 - 5.1 mEq/L 4.0 3.7 3.8  Chloride 101 - 111 mmol/L - - -  CO2 22 - 29 mEq/L 26 23 25   Calcium 8.4 - 10.4 mg/dL 9.3 8.9 9.0  Total Protein 6.4 - 8.3 g/dL 6.9 6.6 6.5  Total Bilirubin 0.20 - 1.20 mg/dL 0.27 0.30 0.30  Alkaline Phos 40 - 150 U/L 66 66 61  AST 5 - 34 U/L 17 21 25   ALT 0 - 55 U/L 13 13 20     CA19.9 u/ML 05/31/2015: 751 07/03/2015: 227 07/19/2015: 159 08/23/2015: 304 09/03/2015: 309 10/15/2015: 357 12/31/2015: 121 01/30/2016: 77 02/28/2016: 51 04/17/16: 39 05/21/2015: 39  PATHOLOGY REPORT  Diagnosis 05/28/2015 FINE NEEDLE ASPIRATION: NEEDLE ASPIRATION, PANCREAS BODY (SPECIMEN 1 OF 1 COLLECTED 05/28/15): WELL DIFFERENTIATED ADENOCARCINOMA. Preliminary  Diagnosis Intraoperative Diagnosis: Adequate ([facility])   RADIOGRAPHIC STUDIES: I have personally reviewed the radiological images as listed and agreed with the findings in the report. No results found.  ASSESSMENT & PLAN:  83 y.o.Caucasian female, presented with intermittent abdominal pain, weight loss, and a CT finding of a pancreatic mass.  1. Pancreatic body/head adenocarcinoma, well differentiated, cT2N1M0, stage IIB, unresectable -I previously reviewed her CT chest  results, which was negative for metastatic disease. -The biopsy results was previously reviewed with her and her family members in detail. -Her case was previously discussed in our GI tumor Board, Dr. Barry Dienes felt this is not resectable disease. -I previously reviewed the nature history of pancreatic cancer, which is aggressive. Her disease is incurable at this stage, and the goal of therapy is palliative and prolong her life. -she tolerated first line chemo gemcitabine poorly, with diarrhea, poor appetite, and skin rash, and was switched to second line Xeloda. - I previously reviewed her restaging CT scan from 6/26, which unfortunately showed disease progression in pancreas, no other metastasis.  -She has completed radiation. CA19.9 has decreased significantly after radiation, indicating good response. -I have requested MSI test on her tumor tissue to see if she is a candidate for immunotherapy, unfortunately there was not enough tissue for the test. -I previously reviewed her restaging CT scan from 02/28/2016, which showed decreased size of the pancreatic mass, no liver or other metastasis. -she has had recurrent GI bleeding, ? pancratic mass invading duodenum and causes bleeding  -She is little reluctant to repeat colonoscopy or EGD. -We discussed repeating a CT scan, to see if she has disease progression, she agrees. -She otherwise is clinically stable, we'll continue observation  2. UTI -She has had dysuria and bladder  discomfort for the past 3 days, Cipro has not improved her symptoms -I'll check her UA and urine culture, which change in antibiotics based on the culture results and sensitivity.  3. Left LE DVT -Probably provoked by her underlying malignancy and chemotherapy -I recommended anticoagulation indefinitely, giving her incurable malignancy, if no contraindications such as bleeding. -She has had recurrent GI bleeding, Xarelto has been stopped   4. Fatigue, anorexia, and abdominal pain -improved some, she'll continue tramadol and Vicodin as needed.  5.HTN, DM, hypothyroidism  -follow up with PCP   6. Anemia -Hemoglobin has steadily decreased since July 2017. 7.6 today and the patient is symptomatic with fatigue.  -I recommend participation for symptomatic anemia. She declined. She wants to try iron -Her repeated ferritin showed low iron level, she started IV Feraheme last week, her anemia has improved this week. She'll receive second dose today. -she has not been taking an iron pill, but says she will start taking it again.   7. Depression  -We discussed the patient feeling depressed by many factors; the cold weather and her cancer diagnosis. -We discussed she is doing well from her cancer standpoint. I encouraged her to be positive. -We discussed antidepressant medication, she has respectfully declined for now.   8. Goal of care -We discussed her goal of care is palliative, giving the unresectable pancreatic cancer -Due to her advanced age and poor tolerance to chemotherapy, we'll continue palliative care alone. -We previously discussed hospice, she is not ready for it.    Plan  -Lab reviewed, anemia slightly improved, she'll receive second dose of ferriheme today -Urinalysis and urine culture today. I'll change her antibiotics based on the culture results -Repeat lab in 2 weeks to see if her anemia has improved adequately -Lab and follow-up in 4 weeks, repeat a CT scan of abdomen before  next visit.  All questions were answered. The patient knows to call the clinic with any problems, questions or concerns.  I spent 20 minutes counseling the patient face to face. The total time spent in the appointment was 30 minutes and more than 50% was on counseling.   [name], MD 06/02/2016

## 2016-06-02 NOTE — Patient Instructions (Signed)
Ferumoxytol injection What is this medicine? FERUMOXYTOL is an iron complex. Iron is used to make healthy red blood cells, which carry oxygen and nutrients throughout the body. This medicine is used to treat iron deficiency anemia in people with chronic kidney disease. COMMON BRAND NAME(S): Feraheme What should I tell my health care provider before I take this medicine? They need to know if you have any of these conditions: -anemia not caused by low iron levels -high levels of iron in the blood -magnetic resonance imaging (MRI) test scheduled -an unusual or allergic reaction to iron, other medicines, foods, dyes, or preservatives -pregnant or trying to get pregnant -breast-feeding How should I use this medicine? This medicine is for injection into a vein. It is given by a health care professional in a hospital or clinic setting. Talk to your pediatrician regarding the use of this medicine in children. Special care may be needed. What if I miss a dose? It is important not to miss your dose. Call your doctor or health care professional if you are unable to keep an appointment. What may interact with this medicine? This medicine may interact with the following medications: -other iron products What should I watch for while using this medicine? Visit your doctor or healthcare professional regularly. Tell your doctor or healthcare professional if your symptoms do not start to get better or if they get worse. You may need blood work done while you are taking this medicine. You may need to follow a special diet. Talk to your doctor. Foods that contain iron include: whole grains/cereals, dried fruits, beans, or peas, leafy green vegetables, and organ meats (liver, kidney). What side effects may I notice from receiving this medicine? Side effects that you should report to your doctor or health care professional as soon as possible: -allergic reactions like skin rash, itching or hives, swelling of the  face, lips, or tongue -breathing problems -changes in blood pressure -feeling faint or lightheaded, falls -fever or chills -flushing, sweating, or hot feelings -swelling of the ankles or feet Side effects that usually do not require medical attention (report to your doctor or health care professional if they continue or are bothersome): -diarrhea -headache -nausea, vomiting -stomach pain Where should I keep my medicine? This drug is given in a hospital or clinic and will not be stored at home.  2017 Elsevier/Gold Standard (2015-05-24 12:41:49)  

## 2016-06-03 ENCOUNTER — Telehealth: Payer: Self-pay | Admitting: *Deleted

## 2016-06-03 NOTE — Telephone Encounter (Signed)
Received vm call from daughter asking for results of urinalysis.  Discussed with Dr Burr Medico & u/a shows UTI but will wait on culture & cont. Cipro for now. Informed daughter.

## 2016-06-04 ENCOUNTER — Telehealth: Payer: Self-pay | Admitting: *Deleted

## 2016-06-04 ENCOUNTER — Other Ambulatory Visit: Payer: Self-pay | Admitting: Nurse Practitioner

## 2016-06-04 NOTE — Telephone Encounter (Signed)
Dtr., Nicole Sherman is calling.  Her mother,Nicole Sherman is in great discomfort.  She is asking about the results of the urine C&S.  Let her know that the culture is back and shows some bacteria that is usually sensitive to cipro, but that the actual sensitivity is not back.  Nicole Sherman is asking exactly when it will be back and also could her mother take more of the pyridium/azostandard which she is taking three times/day.   Discussed with Selena Lesser NP in Dr. Ernestina Penna absence and who knows this patient.  She would not suggest taking the azostandard more often than package states.  She can not guarantee when the sensitivity will be back.  She is fine with changing antibiotics, since the patient has been on it so long and does not see any improvement.  Will change antibiotic to bactrim.  Also patient may take one half of norco if needed for pain.   Called Debbie and let her know all of this.  They will stop the cipro and start bactrim and she will pick it up tonight.  Let her know also about the dosing of azostandard and using norco if needed.  Will route to Dr. Burr Medico so she will be aware of change in antibiotics.

## 2016-06-05 ENCOUNTER — Encounter: Payer: Self-pay | Admitting: Hematology

## 2016-06-05 ENCOUNTER — Other Ambulatory Visit: Payer: Self-pay | Admitting: *Deleted

## 2016-06-05 LAB — URINE CULTURE

## 2016-06-05 MED ORDER — SULFAMETHOXAZOLE-TRIMETHOPRIM 800-160 MG PO TABS: 1.0000 | ORAL_TABLET | Freq: Two times a day (BID) | ORAL | 0 refills | Status: DC

## 2016-06-05 MED FILL — SULFAMETHOXAZOLE/TMP DS TAB: 800-160 | 7 days supply | Qty: 14 | Fill #0

## 2016-06-18 ENCOUNTER — Other Ambulatory Visit: Payer: Medicare Other

## 2016-06-18 ENCOUNTER — Other Ambulatory Visit (HOSPITAL_BASED_OUTPATIENT_CLINIC_OR_DEPARTMENT_OTHER): Payer: Medicare Other

## 2016-06-18 DIAGNOSIS — C251 Malignant neoplasm of body of pancreas: Secondary | ICD-10-CM | POA: Diagnosis not present

## 2016-06-18 DIAGNOSIS — I959 Hypotension, unspecified: Secondary | ICD-10-CM

## 2016-06-18 DIAGNOSIS — Z7901 Long term (current) use of anticoagulants: Secondary | ICD-10-CM

## 2016-06-18 DIAGNOSIS — W19XXXD Unspecified fall, subsequent encounter: Secondary | ICD-10-CM

## 2016-06-18 DIAGNOSIS — Y92009 Unspecified place in unspecified non-institutional (private) residence as the place of occurrence of the external cause: Secondary | ICD-10-CM

## 2016-06-18 LAB — CBC WITH DIFFERENTIAL/PLATELET
BASO%: 1.3 % (ref 0.0–2.0)
BASOS ABS: 0 10*3/uL (ref 0.0–0.1)
EOS%: 2.5 % (ref 0.0–7.0)
Eosinophils Absolute: 0.1 10*3/uL (ref 0.0–0.5)
HEMATOCRIT: 30 % — AB (ref 34.8–46.6)
HEMOGLOBIN: 9.6 g/dL — AB (ref 11.6–15.9)
LYMPH#: 0.7 10*3/uL — AB (ref 0.9–3.3)
LYMPH%: 19.8 % (ref 14.0–49.7)
MCH: 32 pg (ref 25.1–34.0)
MCHC: 32 g/dL (ref 31.5–36.0)
MCV: 100 fL (ref 79.5–101.0)
MONO#: 0.3 10*3/uL (ref 0.1–0.9)
MONO%: 8.9 % (ref 0.0–14.0)
NEUT#: 2.2 10*3/uL (ref 1.5–6.5)
NEUT%: 67.5 % (ref 38.4–76.8)
PLATELETS: 172 10*3/uL (ref 145–400)
RBC: 3 10*6/uL — ABNORMAL LOW (ref 3.70–5.45)
RDW: 18.7 % — AB (ref 11.2–14.5)
WBC: 3.3 10*3/uL — ABNORMAL LOW (ref 3.9–10.3)

## 2016-06-18 LAB — COMPREHENSIVE METABOLIC PANEL
ALBUMIN: 3.8 g/dL (ref 3.5–5.0)
ALK PHOS: 66 U/L (ref 40–150)
ALT: 31 U/L (ref 0–55)
ANION GAP: 8 meq/L (ref 3–11)
AST: 30 U/L (ref 5–34)
BUN: 22.8 mg/dL (ref 7.0–26.0)
CALCIUM: 9.5 mg/dL (ref 8.4–10.4)
CO2: 25 mEq/L (ref 22–29)
CREATININE: 1.2 mg/dL — AB (ref 0.6–1.1)
Chloride: 105 mEq/L (ref 98–109)
EGFR: 42 mL/min/{1.73_m2} — ABNORMAL LOW (ref >90)
Glucose: 164 mg/dl — ABNORMAL HIGH (ref 70–140)
POTASSIUM: 4.3 meq/L (ref 3.5–5.1)
Sodium: 138 mEq/L (ref 136–145)
Total Bilirubin: 0.29 mg/dL (ref 0.20–1.20)
Total Protein: 7 g/dL (ref 6.4–8.3)

## 2016-06-19 LAB — CANCER ANTIGEN 19-9: CA 19-9: 56 U/mL — ABNORMAL HIGH (ref 0–35)

## 2016-07-01 ENCOUNTER — Other Ambulatory Visit: Payer: Self-pay | Admitting: Hematology

## 2016-07-01 DIAGNOSIS — C257 Malignant neoplasm of other parts of pancreas: Secondary | ICD-10-CM

## 2016-07-01 NOTE — Telephone Encounter (Signed)
TC from patient and husband requesting refill on pt's hydrocodone/apap..  Pt has appt tomorrow and will pick up script then.

## 2016-07-02 ENCOUNTER — Other Ambulatory Visit (HOSPITAL_BASED_OUTPATIENT_CLINIC_OR_DEPARTMENT_OTHER): Payer: Medicare Other

## 2016-07-02 DIAGNOSIS — Z7901 Long term (current) use of anticoagulants: Secondary | ICD-10-CM

## 2016-07-02 DIAGNOSIS — W19XXXD Unspecified fall, subsequent encounter: Secondary | ICD-10-CM

## 2016-07-02 DIAGNOSIS — C251 Malignant neoplasm of body of pancreas: Secondary | ICD-10-CM | POA: Diagnosis not present

## 2016-07-02 DIAGNOSIS — Y92009 Unspecified place in unspecified non-institutional (private) residence as the place of occurrence of the external cause: Secondary | ICD-10-CM

## 2016-07-02 DIAGNOSIS — I959 Hypotension, unspecified: Secondary | ICD-10-CM

## 2016-07-02 DIAGNOSIS — D630 Anemia in neoplastic disease: Secondary | ICD-10-CM

## 2016-07-02 LAB — COMPREHENSIVE METABOLIC PANEL
ALBUMIN: 3.8 g/dL (ref 3.5–5.0)
ALK PHOS: 59 U/L (ref 40–150)
ALT: 23 U/L (ref 0–55)
ANION GAP: 9 meq/L (ref 3–11)
AST: 20 U/L (ref 5–34)
BUN: 21.1 mg/dL (ref 7.0–26.0)
CALCIUM: 9.5 mg/dL (ref 8.4–10.4)
CHLORIDE: 107 meq/L (ref 98–109)
CO2: 25 mEq/L (ref 22–29)
CREATININE: 1.2 mg/dL — AB (ref 0.6–1.1)
EGFR: 43 mL/min/{1.73_m2} — ABNORMAL LOW (ref >90)
Glucose: 160 mg/dl — ABNORMAL HIGH (ref 70–140)
POTASSIUM: 3.7 meq/L (ref 3.5–5.1)
Sodium: 142 mEq/L (ref 136–145)
Total Bilirubin: 0.3 mg/dL (ref 0.20–1.20)
Total Protein: 6.9 g/dL (ref 6.4–8.3)

## 2016-07-02 LAB — CBC WITH DIFFERENTIAL/PLATELET
BASO%: 0.6 % (ref 0.0–2.0)
BASOS ABS: 0 10*3/uL (ref 0.0–0.1)
EOS ABS: 0.1 10*3/uL (ref 0.0–0.5)
EOS%: 2.1 % (ref 0.0–7.0)
HEMATOCRIT: 32.5 % — AB (ref 34.8–46.6)
HEMOGLOBIN: 10.3 g/dL — AB (ref 11.6–15.9)
LYMPH#: 0.6 10*3/uL — AB (ref 0.9–3.3)
LYMPH%: 16.4 % (ref 14.0–49.7)
MCH: 32.1 pg (ref 25.1–34.0)
MCHC: 31.7 g/dL (ref 31.5–36.0)
MCV: 101.2 fL — ABNORMAL HIGH (ref 79.5–101.0)
MONO#: 0.3 10*3/uL (ref 0.1–0.9)
MONO%: 8.9 % (ref 0.0–14.0)
NEUT#: 2.4 10*3/uL (ref 1.5–6.5)
NEUT%: 72 % (ref 38.4–76.8)
PLATELETS: 154 10*3/uL (ref 145–400)
RBC: 3.21 10*6/uL — ABNORMAL LOW (ref 3.70–5.45)
RDW: 16.3 % — AB (ref 11.2–14.5)
WBC: 3.4 10*3/uL — ABNORMAL LOW (ref 3.9–10.3)

## 2016-07-02 LAB — FERRITIN: FERRITIN: 238 ng/mL (ref 9–269)

## 2016-07-02 MED FILL — HYDROCODON-APAP 7.5-325: 7.5-325 | 22 days supply | Qty: 90 | Fill #0

## 2016-07-04 ENCOUNTER — Ambulatory Visit (HOSPITAL_COMMUNITY)
Admission: RE | Admit: 2016-07-04 | Discharge: 2016-07-04 | Disposition: A | Discharge status: Home / Self Care | Payer: Medicare Other | Source: Ambulatory Visit | Attending: Hematology | Admitting: Hematology

## 2016-07-04 DIAGNOSIS — I77811 Abdominal aortic ectasia: Secondary | ICD-10-CM | POA: Insufficient documentation

## 2016-07-04 DIAGNOSIS — C251 Malignant neoplasm of body of pancreas: Secondary | ICD-10-CM | POA: Insufficient documentation

## 2016-07-04 DIAGNOSIS — I700 Atherosclerosis of aorta: Secondary | ICD-10-CM | POA: Diagnosis not present

## 2016-07-04 MED ORDER — IOPAMIDOL (ISOVUE-300) INJECTION 61%
INTRAVENOUS | Status: AC
  Administered 2016-07-04: 100 mL
  Filled 2016-07-04: qty 100

## 2016-07-08 NOTE — Progress Notes (Signed)
[place]  [phone] [phone]  Clinic Follow Up Note   Patient Care Team: Hulan Fess, MD as PCP - General (Family Medicine) [name], MD as Consulting Physician (Hematology) Arta Silence, MD as Consulting Physician (Gastroenterology) 07/09/2016  CHIEF COMPLAINTS:  Follow up unresectable pancreatic adenocarcinoma    OTHER RELATED ISSUES 1. Left LE DVT diagnosed on 06/09/2015, started on lovenox and bridged to coumadin, which was held on 10/22/2068 due to GI bleeding. Changed to Xarelto on 10/31/2015, held due to GI bleeding   Oncology History   Pancreatic cancer [facility])   Staging form: Pancreas, AJCC 7th Edition     Clinical stage from 05/27/2014: Stage IIB (T2, N1, M0) - Signed by [name], MD on 05/31/2015       Carcinoma of body of pancreas ([place])   05/16/2015 Imaging    CT chest, abdomen and pelvis with contrast showed a bulky mass at pancreatic neck/body, tumor encases the celiac and proximal branches and a cruise the main portal vein, peripancreatic adenopathy. no distant mets.       05/28/2015 Initial Biopsy    Pancreatic mass needle biopsy showed well differentiated adenocarcinoma      05/31/2015 Initial Diagnosis    Pancreatic cancer ([place])      05/31/2015 Tumor Marker    CA19.9 =375      06/05/2015 - 07/03/2015 Chemotherapy    weekly gemcitabine 1017m/m2, dose reduced to 8539mm2 on week 2 due to tolerance issue      08/27/2015 - 10/30/2015 Chemotherapy    xeloda 100054mid, 14 days on, 7 days off, stopped due to disease progression       11/22/2015 - 12/05/2015 Radiation Therapy    Radiation to her pancreatic cancer       02/28/2016 Imaging    CT c/a/p with contrast IMPRESSION: 1. Decreased size of an infiltrative pancreatic head and neck mass, consistent with adenocarcinoma. Improvement in biliary duct dilatation. 2. No evidence of metastatic disease. 3. Similar arterial and venous involvement with  secondary gastroepiploic collaterals and gastric varices. 4. Similar small volume abdominal pelvic ascites. 5.  No acute process or evidence of metastatic disease in the chest. 6.  Coronary artery atherosclerosis. Aortic atherosclerosis. 7. Suspect gastric antral wall thickening, accentuated by underdistention. This may relate to radiation induced gastritis.      07/04/2016 Imaging    CT Abdomen Pelvis W contrast IMPRESSION: 1. No appreciable change in infiltrative hypoenhancing pancreatic body malignancy, with stable vascular involvement as detailed. 2. No evidence of metastatic disease in the abdomen or pelvis. 3. Stable mildly dilated central intrahepatic bile ducts and common bile duct. 4. Small volume free fluid in the deep pelvis. 5. Additional findings include aortic atherosclerosis, stable ectasia of the infrarenal abdominal aorta with maximum diameter 2.5 cm, decreased nonspecific mild wall thickening in the gastric antrum possibly representing evolving postradiation change, and diffuse colonic diverticulosis.       HISTORY OF PRESENTING ILLNESS:  Nicole Sherman 87o. female is here because of her recent abnormal CT scan, which showed a pink vaginal mass, highly suspicious for pancreatic cancer.  She has been haivng epigastric pain after meal for 4-5 months. She also reports left side pain at night when she sleeps on left side, mild back pain, the pain is worse lately, lasts longer especially afte rdinner, it about 5/10, appetite is lower than before, she lost aobut 10-20lbs in the past 5 months. No other complains, she had loose BM 1-2 time BM  for 8-10 months, no hematochezia or melena. She was evaluated by her primary care physician Dr. Rex Kras, abdominal ultrasound was negative on 05/04/2015. She underwent a CT abdomen and pelvis with contrast on 05/16/2015, which showed a 4 x 1 cm mass in pancreatic body and a neck, tumor encases the distal celiac and proximal branches and  occludes the main portal vein. No distant metastasis on the CT scan. She was referred to [place] for further workup.  She has good energy level, she is able to do all house work. She has intermittent dizziness from blood pressure meds. No other complains.   CURRENT THERAPY: supportive care   INTERIM HISTORY: Nicole Sherman returns for follow up as scheduled. She is accompanied by family today. The patient reports she is feeling "great" today. The patient reports some fatigue, which she attributes to her anemia. She reports rare occurences of nausea.   MEDICAL HISTORY:  Past Medical History:  Diagnosis Date  . Arthritis    osteoarthritis-hands wrist  . Cancer ([place])    pancreatic mass- dx. adenocarcinoma" CT abdomen 05-16-15  . Diabetes mellitus without complication ([place])   . Diverticulosis    showing on CT of abdomen 05-16-15  . Hypertension   . Hypothyroidism     SURGICAL HISTORY: Past Surgical History:  Procedure Laterality Date  . ANKLE SURGERY Right   . APPENDECTOMY     open  . CATARACT EXTRACTION Right   . EUS N/A 05/28/2015   Procedure: UPPER ENDOSCOPIC ULTRASOUND (EUS) LINEAR;  Surgeon: Arta Silence, MD;  Location: [facility];  Service: Endoscopy;  Laterality: N/A;  . EYE SURGERY     macular hole surgery repair  . JOINT REPLACEMENT Right   . SHOULDER ARTHROSCOPY W/ ROTATOR CUFF REPAIR Left   . SHOULDER OPEN ROTATOR CUFF REPAIR Right   . TUBAL LIGATION      SOCIAL HISTORY: Social History   Social History  . Marital Status: Married    Spouse Name: N/A  . Number of Children: 2 daughters   . Years of Education: N/A   Occupational History  . Not on file.   Social History Main Topics  . Smoking status: Former Smoker -- 1.00 packs/day for 30 years    Types: Cigarettes    Quit date: 05/21/1986  . Smokeless tobacco: Not on file  . Alcohol Use: No  . Drug Use: No  . Sexual Activity: Not on file   Other Topics Concern  . Not on file   Social History Narrative     FAMILY HISTORY: Family History  Problem Relation Age of Onset  . Stroke Mother   . Cancer Sister     lung cancer     ALLERGIES:  is allergic to keflex [cephalexin] and aspirin.  MEDICATIONS:  Current Outpatient Prescriptions  Medication Sig Dispense Refill  . ciprofloxacin (CIPRO) 500 MG tablet Take 1 tablet (500 mg total) by mouth 2 (two) times daily. 14 tablet 0  . docusate sodium (COLACE) 100 MG capsule Take 100 mg by mouth 3 (three) times daily.    Marland Kitchen HYDROcodone-acetaminophen (NORCO) 7.5-325 MG tablet TAKE 1 TABLET BY MOUTH EVERY 6 HOURS AS NEEDED FOR MODERATE PAIN 90 tablet 0  . levothyroxine (SYNTHROID, LEVOTHROID) 100 MCG tablet Take 100 mcg by mouth daily before breakfast.     . metFORMIN (GLUCOPHAGE) 1000 MG tablet Take 1,000 mg by mouth 2 (two) times daily with a meal.     . omeprazole (PRILOSEC OTC) 20 MG tablet Take 20 mg by mouth  daily as needed (acid reflux). Reported on 11/23/2015    . ondansetron (ZOFRAN) 8 MG tablet TAKE ONE TABLET BY MOUTH TWICE DAILY AS NEEDED FOR NAUSEA AND VOMITING (Patient not taking: Reported on 06/02/2016) 30 tablet 0  . prochlorperazine (COMPAZINE) 5 MG tablet Take 1-2 tablets (5-10 mg total) by mouth every 8 (eight) hours as needed for nausea or vomiting. (Patient not taking: Reported on 04/29/2016) 30 tablet 2  . sulfamethoxazole-trimethoprim (BACTRIM DS,SEPTRA DS) 800-160 MG tablet Take 1 tablet by mouth 2 (two) times daily. 14 tablet 0   No current facility-administered medications for this visit.     REVIEW OF SYSTEMS: Constitutional: Denies fevers, chills or abnormal night sweats. (+) Fatigue  Eyes: Denies blurriness of vision, double vision or watery eyes Ears, nose, mouth, throat, and face: Denies mucositis or sore throat Respiratory: Denies cough, dyspnea or wheezes Cardiovascular: Denies palpitation, chest discomfort or lower extremity swelling Gastrointestinal:  Denies heartburn or change in bowel habits (+) constipation (+) R  flank pain, nausea Skin: Denies abnormal skin rashes. Lymphatics: Denies new lymphadenopathy or easy bruising Neurological:Denies numbness, tingling or new weaknesses Behavioral/Psych: Mood is stable, no new changes  All other systems were reviewed with the patient and are negative.  PHYSICAL EXAMINATION: ECOG PERFORMANCE STATUS: 2  Vitals:   07/09/16 1156  BP: 134/82  Pulse: 70  Resp: 18  Temp: 98.4 F (36.9 C)   Filed Weights   07/09/16 1156  Weight: 154 lb 8 oz (70.1 kg)   GENERAL:alert, oriented, appears to be pale  SKIN: skin color, texture, turgor are normal EYES: normal, conjunctiva are pink and non-injected, sclera clear OROPHARYNX:no exudate, no erythema and lips, buccal mucosa, and tongue normal  NECK: supple, thyroid normal size, non-tender, without nodularity LYMPH:  no palpable lymphadenopathy in the cervical, axillary or inguinal LUNGS: clear to auscultation and percussion with normal breathing effort HEART: regular rate & rhythm and no murmurs and no lower extremity edema ABDOMEN:abdomen soft, non-tender and normal bowel sounds Musculoskeletal:no cyanosis of digits and no clubbing  PSYCH: alert & oriented x 3 with fluent speech NEURO: no focal motor/sensory deficits  LABORATORY DATA:  I have reviewed the data as listed  CBC Latest Ref Rng & Units 07/02/2016 06/18/2016 06/02/2016  WBC 3.9 - 10.3 10e3/uL 3.4(L) 3.3(L) 4.9  Hemoglobin 11.6 - 15.9 g/dL 10.3(L) 9.6(L) 8.2(L)  Hematocrit 34.8 - 46.6 % 32.5(L) 30.0(L) 26.4(L)  Platelets 145 - 400 10e3/uL 154 172 177   CMP Latest Ref Rng & Units 07/02/2016 06/18/2016 06/02/2016  Glucose 70 - 140 mg/dl 160(H) 164(H) 154(H)  BUN 7.0 - 26.0 mg/dL 21.1 22.8 17.9  Creatinine 0.6 - 1.1 mg/dL 1.2(H) 1.2(H) 1.4(H)  Sodium 136 - 145 mEq/L 142 138 140  Potassium 3.5 - 5.1 mEq/L 3.7 4.3 4.0  Chloride 101 - 111 mmol/L - - -  CO2 22 - 29 mEq/L 25 25 26   Calcium 8.4 - 10.4 mg/dL 9.5 9.5 9.3  Total Protein 6.4 - 8.3 g/dL 6.9  7.0 6.9  Total Bilirubin 0.20 - 1.20 mg/dL 0.30 0.29 0.27  Alkaline Phos 40 - 150 U/L 59 66 66  AST 5 - 34 U/L 20 30 17   ALT 0 - 55 U/L 23 31 13     CA19.9 u/ML 05/31/2015: 751 07/03/2015: 227 07/19/2015: 159 08/23/2015: 304 09/03/2015: 309 10/15/2015: 357 12/31/2015: 121 01/30/2016: 77 02/28/2016: 51 04/17/16: 39 05/21/2015: 39 06/18/16: 56  PATHOLOGY REPORT  Diagnosis 05/28/2015 FINE NEEDLE ASPIRATION: NEEDLE ASPIRATION, PANCREAS BODY (SPECIMEN 1 OF 1 COLLECTED 05/28/15):  WELL DIFFERENTIATED ADENOCARCINOMA. Preliminary Diagnosis Intraoperative Diagnosis: Adequate (JDP)   RADIOGRAPHIC STUDIES: I have personally reviewed the radiological images as listed and agreed with the findings in the report. Ct Abdomen Pelvis W Contrast  Result Date: 07/04/2016 CLINICAL DATA:  Re- stage unresectable pancreatic body adenocarcinoma diagnosed January 2017 status post chemotherapy and radiation therapy completed October 2017. Prior appendectomy. EXAM: CT ABDOMEN AND PELVIS WITH CONTRAST TECHNIQUE: Multidetector CT imaging of the abdomen and pelvis was performed using the standard protocol following bolus administration of intravenous contrast. CONTRAST:  163m ISOVUE-300 IOPAMIDOL (ISOVUE-300) INJECTION 61% COMPARISON:  02/28/2016 CT chest, abdomen and pelvis. FINDINGS: Lower chest: No significant pulmonary nodules or acute consolidative airspace disease. Hepatobiliary: Normal liver size. Stable simple 2.7 cm inferior right liver lobe cyst. No new discrete liver lesions. Vague heterogeneous enhancement in the left liver lobe on the portal venous phase (which was obtained in the late arterial phase) is stable compared to the arterial phase sequence from the 10/29/2015 CT study and favored to represent benign transient perfusional phenomena. Normal gallbladder with no radiopaque cholelithiasis. Stable mild pericholecystic varices. Stable mild dilatation of the central intrahepatic bile ducts and common bile duct (8  mm diameter). No radiopaque choledocholithiasis. Pancreas: Hypoenhancing infiltrative 5.7 x 4.4 cm pancreatic body mass (series 14/ image 37) previously measured 5.8 x 4.4 cm on 02/28/2016 using similar measurement technique, unchanged in size. There is stable encasement of the celiac artery and proximal celiac artery branches and proximal SMA by tumor. There is stable occlusion of the portosplenic venous confluence with prominent varices surrounding the pancreatic head, which re- constitute the distal main portal vein. Stable atrophy of the pancreatic tail and prominent dilatation of the main pancreatic duct up to 15 mm diameter in the distal pancreatic body. Spleen: Normal size. No mass. Adrenals/Urinary Tract: No discrete adrenal nodules. No hydronephrosis. Simple 1.5 cm upper right renal cysts. Tiny subcentimeter hypodense renal cortical lesions in both kidneys are too small to characterize and appears stable, suggesting benign renal cysts. Normal bladder. Stomach/Bowel: Stable appearance of the nondistended stomach with moderate proximal gastric varices and decreased mild wall thickening in the gastric antrum. Normal caliber small bowel with no small bowel wall thickening. Appendectomy. Marked sigmoid diverticulosis with scattered mild diverticulosis in the remaining colon, with no definite large bowel wall thickening or new pericolonic fat stranding. Oral contrast progresses to the descending colon. Vascular/Lymphatic: Abdominal aortic atherosclerosis with stable ectasia of the infrarenal abdominal aorta, maximum diameter 2.5 cm. Patent renal veins. No pathologically enlarged lymph nodes in the abdomen or pelvis. Reproductive: Grossly normal uterus.  No adnexal mass. Other: No pneumoperitoneum. Small volume free fluid in the deep pelvis. No focal fluid collections. Musculoskeletal: No aggressive appearing focal osseous lesions. Moderate thoracolumbar spondylosis. IMPRESSION: 1. No appreciable change in  infiltrative hypoenhancing pancreatic body malignancy, with stable vascular involvement as detailed. 2. No evidence of metastatic disease in the abdomen or pelvis. 3. Stable mildly dilated central intrahepatic bile ducts and common bile duct. 4. Small volume free fluid in the deep pelvis. 5. Additional findings include aortic atherosclerosis, stable ectasia of the infrarenal abdominal aorta with maximum diameter 2.5 cm, decreased nonspecific mild wall thickening in the gastric antrum possibly representing evolving postradiation change, and diffuse colonic diverticulosis. Electronically Signed   By: JIlona SorrelM.D.   On: 07/04/2016 14:46    ASSESSMENT & PLAN:  83y.o.Caucasian female, presented with intermittent abdominal pain, weight loss, and a CT finding of a pancreatic mass.  1. Pancreatic body/head adenocarcinoma,  well differentiated, cT2N1M0, stage IIB, unresectable -I previously reviewed her CT chest results, which was negative for metastatic disease. -The biopsy results was previously reviewed with her and her family members in detail. -Her case was previously discussed in our GI tumor Board, Dr. Barry Dienes felt this is not resectable disease. -I previously reviewed the nature history of pancreatic cancer, which is aggressive. Her disease is incurable at this stage, and the goal of therapy is palliative and prolong her life. -she tolerated first line chemo gemcitabine poorly, with diarrhea, poor appetite, and skin rash, and was switched to second line Xeloda. - I previously reviewed her restaging CT scan from 10/29/15, which unfortunately showed disease progression in pancreas, no other metastasis.  -She has completed radiation. CA19.9 has decreased significantly after radiation, indicating good response. -I have requested MSI test on her tumor tissue to see if she is a candidate for immunotherapy, unfortunately there was not enough tissue for the test. -I previously reviewed her restaging CT scan  from 02/28/2016, which showed decreased size of the pancreatic mass, no liver or other metastasis. -she has had recurrent GI bleeding, spontaneously resolved, and did not require any intervention. -She is little reluctant to repeat colonoscopy or EGD. -We reviewed her restaging CT scan from 07/04/2016, which showed a stable pancreatic mass, no metastasis. -She otherwise is clinically stable, anemia has improved, we'll continue observation  2. Anemia -She has intermittent worsening anemia, from GI bleeding, iron deficiency. -Her anemia has improved after IV Feraheme on 05/26/16 and 06/02/2016 -Ferritin on 07/02/16 was 238, which was a significant improvement following IV Faraheme. -she has not been taking an iron pill, but says she will start taking it again. I advised her she could take a multivitamin, still soft and with iron to avoid constipation. -We will continue to monitor and give IV iron as needed. -Anemia is improved today.  3. Left LE DVT -Probably provoked by her underlying malignancy and chemotherapy -I recommended anticoagulation indefinitely, giving her incurable malignancy, if no contraindications such as bleeding. -She has had recurrent GI bleeding, Xarelto has been stopped   4. Fatigue, anorexia, and abdominal pain -improved some, she'll continue tramadol and Vicodin as needed. -I encouraged the patient to nap as needed, but then to remain active to improve her energy levels. -I encouraged the patient to eat smaller, more frequent meals to avoid gastric pain.  5.HTN, DM, hypothyroidism  -follow up with PCP   6. Depression  -We previously discussed the patient feeling depressed by many factors; the cold weather and her cancer diagnosis. -We previously discussed she is doing well from her cancer standpoint. I encouraged her to be positive.  -We previously discussed antidepressant medication, she has respectfully declined for now.   7. Goal of care -We discussed her goal of  care is palliative, giving the unresectable pancreatic cancer -Due to her advanced age and poor tolerance to chemotherapy, we'll continue palliative care alone. -We previously discussed hospice, she is not ready for it.    Plan  -Labs reviewed, CT scan showed a stable pancreatic mass, no other metastasis, anemia improved, she is quite happy with good results. -The patient will have labs every 3 weeks and follow up with me every 6 weeks.  -Consider blood transfusion if hemoglobin less than 8 or symptomatic anemia. We'll repeat IV Feraheme as needed.  All questions were answered. The patient knows to call the clinic with any problems, questions or concerns.  I spent 20 minutes counseling the patient face to face.  The total time spent in the appointment was 30 minutes and more than 50% was on counseling.  This document serves as a record of services personally performed by [name], MD. It was created on her behalf by Maryla Morrow, a trained medical scribe. The creation of this record is based on the scribe's personal observations and the provider's statements to them. This document has been checked and approved by the attending provider.   [name], MD 07/09/2016

## 2016-07-09 ENCOUNTER — Telehealth: Payer: Self-pay | Admitting: Hematology

## 2016-07-09 ENCOUNTER — Ambulatory Visit (HOSPITAL_BASED_OUTPATIENT_CLINIC_OR_DEPARTMENT_OTHER): Payer: Medicare Other | Admitting: Hematology

## 2016-07-09 ENCOUNTER — Encounter: Payer: Self-pay | Admitting: Hematology

## 2016-07-09 VITALS — BP 134/82 | HR 70 | Temp 98.4°F | Resp 18 | Ht 66.5 in | Wt 154.5 lb

## 2016-07-09 DIAGNOSIS — C258 Malignant neoplasm of overlapping sites of pancreas: Secondary | ICD-10-CM

## 2016-07-09 DIAGNOSIS — E119 Type 2 diabetes mellitus without complications: Secondary | ICD-10-CM

## 2016-07-09 DIAGNOSIS — Z86718 Personal history of other venous thrombosis and embolism: Secondary | ICD-10-CM

## 2016-07-09 DIAGNOSIS — I10 Essential (primary) hypertension: Secondary | ICD-10-CM

## 2016-07-09 DIAGNOSIS — C251 Malignant neoplasm of body of pancreas: Secondary | ICD-10-CM

## 2016-07-09 DIAGNOSIS — E039 Hypothyroidism, unspecified: Secondary | ICD-10-CM | POA: Diagnosis not present

## 2016-07-09 NOTE — Telephone Encounter (Signed)
Gave patient AVS and calender per 07/09/2016 los 

## 2016-07-29 ENCOUNTER — Telehealth: Payer: Self-pay | Admitting: *Deleted

## 2016-07-29 ENCOUNTER — Other Ambulatory Visit: Payer: Self-pay | Admitting: *Deleted

## 2016-07-29 DIAGNOSIS — C257 Malignant neoplasm of other parts of pancreas: Secondary | ICD-10-CM

## 2016-07-29 MED ORDER — HYDROCODONE-ACETAMINOPHEN 7.5-325 MG PO TABS: 1.0000 | ORAL_TABLET | Freq: Four times a day (QID) | ORAL | 0 refills | Status: DC | PRN

## 2016-07-29 NOTE — Telephone Encounter (Signed)
Pt called and left message requesting refill of Hydrocodone.   Stated she would be coming in on Wed am for labs , and would like to pick up script at the same time.

## 2016-07-30 ENCOUNTER — Other Ambulatory Visit (HOSPITAL_BASED_OUTPATIENT_CLINIC_OR_DEPARTMENT_OTHER): Payer: Medicare Other

## 2016-07-30 DIAGNOSIS — C251 Malignant neoplasm of body of pancreas: Secondary | ICD-10-CM | POA: Diagnosis not present

## 2016-07-30 DIAGNOSIS — D630 Anemia in neoplastic disease: Secondary | ICD-10-CM

## 2016-07-30 LAB — COMPREHENSIVE METABOLIC PANEL
ALBUMIN: 3.8 g/dL (ref 3.5–5.0)
ALK PHOS: 65 U/L (ref 40–150)
ALT: 19 U/L (ref 0–55)
AST: 20 U/L (ref 5–34)
Anion Gap: 9 mEq/L (ref 3–11)
BILIRUBIN TOTAL: 0.32 mg/dL (ref 0.20–1.20)
BUN: 22.2 mg/dL (ref 7.0–26.0)
CO2: 25 meq/L (ref 22–29)
CREATININE: 1.5 mg/dL — AB (ref 0.6–1.1)
Calcium: 9.4 mg/dL (ref 8.4–10.4)
Chloride: 105 mEq/L (ref 98–109)
EGFR: 34 mL/min/{1.73_m2} — ABNORMAL LOW (ref >90)
Glucose: 174 mg/dl — ABNORMAL HIGH (ref 70–140)
Potassium: 4.3 mEq/L (ref 3.5–5.1)
Sodium: 139 mEq/L (ref 136–145)
TOTAL PROTEIN: 7 g/dL (ref 6.4–8.3)

## 2016-07-30 LAB — CBC WITH DIFFERENTIAL/PLATELET
BASO%: 0.9 % (ref 0.0–2.0)
BASOS ABS: 0 10*3/uL (ref 0.0–0.1)
EOS ABS: 0.1 10*3/uL (ref 0.0–0.5)
EOS%: 1.6 % (ref 0.0–7.0)
HEMATOCRIT: 33.5 % — AB (ref 34.8–46.6)
HGB: 11.2 g/dL — ABNORMAL LOW (ref 11.6–15.9)
LYMPH#: 0.7 10*3/uL — AB (ref 0.9–3.3)
LYMPH%: 16.8 % (ref 14.0–49.7)
MCH: 33.3 pg (ref 25.1–34.0)
MCHC: 33.3 g/dL (ref 31.5–36.0)
MCV: 99.8 fL (ref 79.5–101.0)
MONO#: 0.4 10*3/uL (ref 0.1–0.9)
MONO%: 10.2 % (ref 0.0–14.0)
NEUT%: 70.5 % (ref 38.4–76.8)
NEUTROS ABS: 2.8 10*3/uL (ref 1.5–6.5)
PLATELETS: 163 10*3/uL (ref 145–400)
RBC: 3.36 10*6/uL — ABNORMAL LOW (ref 3.70–5.45)
RDW: 15.7 % — AB (ref 11.2–14.5)
WBC: 3.9 10*3/uL (ref 3.9–10.3)

## 2016-07-30 LAB — IRON AND TIBC
%SAT: 33 % (ref 21–57)
Iron: 96 ug/dL (ref 41–142)
TIBC: 288 ug/dL (ref 236–444)
UIBC: 192 ug/dL (ref 120–384)

## 2016-07-30 LAB — FERRITIN: Ferritin: 132 ng/ml (ref 9–269)

## 2016-07-31 LAB — CANCER ANTIGEN 19-9: CAN 19-9: 96 U/mL — AB (ref 0–35)

## 2016-08-11 MED FILL — HYDROCODON-APAP 7.5-325: 7.5-325 | 22 days supply | Qty: 90 | Fill #0

## 2016-08-18 NOTE — Progress Notes (Signed)
[place]  [phone] [phone]  Clinic Follow Up Note   Patient Care Team: Hulan Fess, MD as PCP - General (Family Medicine) [name], MD as Consulting Physician (Hematology) Arta Silence, MD as Consulting Physician (Gastroenterology) 08/20/2016  CHIEF COMPLAINTS:  Follow up unresectable pancreatic adenocarcinoma    OTHER RELATED ISSUES 1. Left LE DVT diagnosed on 06/09/2015, started on lovenox and bridged to coumadin, which was held on 10/22/2068 due to GI bleeding. Changed to Xarelto on 10/31/2015, held due to GI bleeding   Oncology History   Pancreatic cancer [facility])   Staging form: Pancreas, AJCC 7th Edition     Clinical stage from 05/27/2014: Stage IIB (T2, N1, M0) - Signed by [name], MD on 05/31/2015       Carcinoma of body of pancreas ([place])   05/16/2015 Imaging    CT chest, abdomen and pelvis with contrast showed a bulky mass at pancreatic neck/body, tumor encases the celiac and proximal branches and a cruise the main portal vein, peripancreatic adenopathy. no distant mets.       05/28/2015 Initial Biopsy    Pancreatic mass needle biopsy showed well differentiated adenocarcinoma      05/31/2015 Initial Diagnosis    Pancreatic cancer ([place])      05/31/2015 Tumor Marker    CA19.9 =375      06/05/2015 - 07/03/2015 Chemotherapy    weekly gemcitabine 1042m/m2, dose reduced to 8576mm2 on week 2 due to tolerance issue      08/27/2015 - 10/30/2015 Chemotherapy    xeloda 10006mid, 14 days on, 7 days off, stopped due to disease progression       11/22/2015 - 12/05/2015 Radiation Therapy    Radiation to her pancreatic cancer       02/28/2016 Imaging    CT c/a/p with contrast IMPRESSION: 1. Decreased size of an infiltrative pancreatic head and neck mass, consistent with adenocarcinoma. Improvement in biliary duct dilatation. 2. No evidence of metastatic disease. 3. Similar arterial and venous involvement with  secondary gastroepiploic collaterals and gastric varices. 4. Similar small volume abdominal pelvic ascites. 5.  No acute process or evidence of metastatic disease in the chest. 6.  Coronary artery atherosclerosis. Aortic atherosclerosis. 7. Suspect gastric antral wall thickening, accentuated by underdistention. This may relate to radiation induced gastritis.      07/04/2016 Imaging    CT Abdomen Pelvis W contrast IMPRESSION: 1. No appreciable change in infiltrative hypoenhancing pancreatic body malignancy, with stable vascular involvement as detailed. 2. No evidence of metastatic disease in the abdomen or pelvis. 3. Stable mildly dilated central intrahepatic bile ducts and common bile duct. 4. Small volume free fluid in the deep pelvis. 5. Additional findings include aortic atherosclerosis, stable ectasia of the infrarenal abdominal aorta with maximum diameter 2.5 cm, decreased nonspecific mild wall thickening in the gastric antrum possibly representing evolving postradiation change, and diffuse colonic diverticulosis.      07/04/2016 Imaging    CT A/P IMPRESSION: 1. No appreciable change in infiltrative hypoenhancing pancreatic body malignancy, with stable vascular involvement as detailed. 2. No evidence of metastatic disease in the abdomen or pelvis. 3. Stable mildly dilated central intrahepatic bile ducts and common bile duct. 4. Small volume free fluid in the deep pelvis. 5. Additional findings include aortic atherosclerosis, stable ectasia of the infrarenal abdominal aorta with maximum diameter 2.5 cm, decreased nonspecific mild wall thickening in the gastric antrum possibly representing evolving postradiation change, and diffuse colonic diverticulosis.  HISTORY OF PRESENTING ILLNESS:  Nicole Sherman 83 y.o. female is here because of her recent abnormal CT scan, which showed a pink vaginal mass, highly suspicious for pancreatic cancer.  She has been haivng  epigastric pain after meal for 4-5 months. She also reports left side pain at night when she sleeps on left side, mild back pain, the pain is worse lately, lasts longer especially afte rdinner, it about 5/10, appetite is lower than before, she lost aobut 10-20lbs in the past 5 months. No other complains, she had loose BM 1-2 time BM for 8-10 months, no hematochezia or melena. She was evaluated by her primary care physician Dr. Rex Kras, abdominal ultrasound was negative on 05/04/2015. She underwent a CT abdomen and pelvis with contrast on 05/16/2015, which showed a 4 x 1 cm mass in pancreatic body and a neck, tumor encases the distal celiac and proximal branches and occludes the main portal vein. No distant metastasis on the CT scan. She was referred to [place] for further workup.  She has good energy level, she is able to do all house work. She has intermittent dizziness from blood pressure meds. No other complains.   CURRENT THERAPY: supportive care   INTERIM HISTORY: Nicole Sherman returns for follow up as scheduled. She is doing well, pain is controlled. She celebrated her birthday last weekend, she and her family members went to [place]. She really enjoyed the trip. She has been eating well lately, and some weight. No significant nausea, or other new complaints. She still has mild to moderate fatigue, able to do routine activities. No recent bleeding.    MEDICAL HISTORY:  Past Medical History:  Diagnosis Date  . Arthritis    osteoarthritis-hands wrist  . Cancer ([place])    pancreatic mass- dx. adenocarcinoma" CT abdomen 05-16-15  . Diabetes mellitus without complication ([place])   . Diverticulosis    showing on CT of abdomen 05-16-15  . Hypertension   . Hypothyroidism     SURGICAL HISTORY: Past Surgical History:  Procedure Laterality Date  . ANKLE SURGERY Right   . APPENDECTOMY     open  . CATARACT EXTRACTION Right   . EUS N/A 05/28/2015   Procedure: UPPER ENDOSCOPIC ULTRASOUND (EUS) LINEAR;   Surgeon: Arta Silence, MD;  Location: [facility] END[facility]COPY;  Service: Endoscopy;  Laterality: N/A;  . EYE SURGERY     macular hole surgery repair  . JOINT REPLACEMENT Right   . SHOULDER ARTHROSCOPY W/ ROTATOR CUFF REPAIR Left   . SHOULDER OPEN ROTATOR CUFF REPAIR Right   . TUBAL LIGATION      SOCIAL HISTORY: Social History   Social History  . Marital Status: Married    Spouse Name: N/A  . Number of Children: 2 daughters   . Years of Education: N/A   Occupational History  . Not on file.   Social History Main Topics  . Smoking status: Former Smoker -- 1.00 packs/day for 30 years    Types: Cigarettes    Quit date: 05/21/1986  . Smokeless tobacco: Not on file  . Alcohol Use: No  . Drug Use: No  . Sexual Activity: Not on file   Other Topics Concern  . Not on file   Social History Narrative    FAMILY HISTORY: Family History  Problem Relation Age of Onset  . Stroke Mother   . Cancer Sister     lung cancer     ALLERGIES:  is allergic to keflex [cephalexin] and aspirin.  MEDICATIONS:  Current Outpatient  Prescriptions  Medication Sig Dispense Refill  . docusate sodium (COLACE) 100 MG capsule Take 100 mg by mouth 3 (three) times daily.    Marland Kitchen HYDROcodone-acetaminophen (NORCO) 7.5-325 MG tablet Take 1 tablet by mouth every 6 (six) hours as needed for moderate pain. 90 tablet 0  . levothyroxine (SYNTHROID, LEVOTHROID) 100 MCG tablet Take 100 mcg by mouth daily before breakfast.     . metFORMIN (GLUCOPHAGE) 1000 MG tablet Take 1,000 mg by mouth 2 (two) times daily with a meal.     . omeprazole (PRILOSEC OTC) 20 MG tablet Take 20 mg by mouth daily as needed (acid reflux). Reported on 11/23/2015    . ondansetron (ZOFRAN) 8 MG tablet TAKE ONE TABLET BY MOUTH TWICE DAILY AS NEEDED FOR NAUSEA AND VOMITING 30 tablet 0  . prochlorperazine (COMPAZINE) 5 MG tablet Take 1-2 tablets (5-10 mg total) by mouth every 8 (eight) hours as needed for nausea or vomiting. 30 tablet 2  .  sulfamethoxazole-trimethoprim (BACTRIM DS,SEPTRA DS) 800-160 MG tablet Take 1 tablet by mouth 2 (two) times daily. 14 tablet 0   No current facility-administered medications for this visit.     REVIEW OF SYSTEMS: Constitutional: Denies fevers, chills or abnormal night sweats. (+) Fatigue  Eyes: Denies blurriness of vision, double vision or watery eyes Ears, nose, mouth, throat, and face: Denies mucositis or sore throat Respiratory: Denies cough, dyspnea or wheezes Cardiovascular: Denies palpitation, chest discomfort or lower extremity swelling Gastrointestinal:  Denies heartburn or change in bowel habits (+) constipation (+) R flank pain, nausea Skin: Denies abnormal skin rashes. Lymphatics: Denies new lymphadenopathy or easy bruising Neurological:Denies numbness, tingling or new weaknesses Behavioral/Psych: Mood is stable, no new changes  All other systems were reviewed with the patient and are negative.  PHYSICAL EXAMINATION: ECOG PERFORMANCE STATUS: 2  Vitals:   08/20/16 1315  BP: (!) 150/78  Pulse: 63  Resp: 18  Temp: 98.8 F (37.1 C)   Filed Weights   08/20/16 1315  Weight: 161 lb 8 oz (73.3 kg)   GENERAL:alert, oriented, appears to be pale  SKIN: skin color, texture, turgor are normal EYES: normal, conjunctiva are pink and non-injected, sclera clear OROPHARYNX:no exudate, no erythema and lips, buccal mucosa, and tongue normal  NECK: supple, thyroid normal size, non-tender, without nodularity LYMPH:  no palpable lymphadenopathy in the cervical, axillary or inguinal LUNGS: clear to auscultation and percussion with normal breathing effort HEART: regular rate & rhythm and no murmurs and no lower extremity edema ABDOMEN:abdomen soft, non-tender and normal bowel sounds Musculoskeletal:no cyanosis of digits and no clubbing  PSYCH: alert & oriented x 3 with fluent speech NEURO: no focal motor/sensory deficits  LABORATORY DATA:  I have reviewed the data as listed  CBC  Latest Ref Rng & Units 08/20/2016 07/30/2016 07/02/2016  WBC 3.9 - 10.3 10e3/uL 3.0(L) 3.9 3.4(L)  Hemoglobin 11.6 - 15.9 g/dL 11.1(L) 11.2(L) 10.3(L)  Hematocrit 34.8 - 46.6 % 34.0(L) 33.5(L) 32.5(L)  Platelets 145 - 400 10e3/uL 130(L) 163 154   CMP Latest Ref Rng & Units 08/20/2016 07/30/2016 07/02/2016  Glucose 70 - 140 mg/dl 228(H) 174(H) 160(H)  BUN 7.0 - 26.0 mg/dL 21.6 22.2 21.1  Creatinine 0.6 - 1.1 mg/dL 1.3(H) 1.5(H) 1.2(H)  Sodium 136 - 145 mEq/L 139 139 142  Potassium 3.5 - 5.1 mEq/L 4.5 4.3 3.7  Chloride 101 - 111 mmol/L - - -  CO2 22 - 29 mEq/L 25 25 25   Calcium 8.4 - 10.4 mg/dL 9.2 9.4 9.5  Total Protein 6.4 -  8.3 g/dL 6.9 7.0 6.9  Total Bilirubin 0.20 - 1.20 mg/dL 0.42 0.32 0.30  Alkaline Phos 40 - 150 U/L 62 65 59  AST 5 - 34 U/L 19 20 20   ALT 0 - 55 U/L 17 19 23     CA19.9 u/ML 05/31/2015: 751 07/03/2015: 227 07/19/2015: 159 08/23/2015: 304 09/03/2015: 309 10/15/2015: 357 12/31/2015: 121 01/30/2016: 77 02/28/2016: 51 04/17/16: 39 05/21/2015: 39 06/18/16: 56 07/30/16: 96  PATHOLOGY REPORT  Diagnosis 05/28/2015 FINE NEEDLE ASPIRATION: NEEDLE ASPIRATION, PANCREAS BODY (SPECIMEN 1 OF 1 COLLECTED 05/28/15): WELL DIFFERENTIATED ADENOCARCINOMA.   RADIOGRAPHIC STUDIES: I have personally reviewed the radiological images as listed and agreed with the findings in the report. No results found.   CT A/P 07/04/16 IMPRESSION: 1. No appreciable change in infiltrative hypoenhancing pancreatic body malignancy, with stable vascular involvement as detailed. 2. No evidence of metastatic disease in the abdomen or pelvis. 3. Stable mildly dilated central intrahepatic bile ducts and common bile duct. 4. Small volume free fluid in the deep pelvis. 5. Additional findings include aortic atherosclerosis, stable ectasia of the infrarenal abdominal aorta with maximum diameter 2.5 cm, decreased nonspecific mild wall thickening in the gastric antrum possibly representing evolving postradiation  change, and diffuse colonic diverticulosis.   ASSESSMENT & PLAN:  83 y.o.Caucasian female, presented with intermittent abdominal pain, weight loss, and a CT finding of a pancreatic mass.  1. Pancreatic body/head adenocarcinoma, well differentiated, cT2N1M0, stage IIB, unresectable -I previously reviewed her CT chest results, which was negative for metastatic disease. -The biopsy results was previously reviewed with her and her family members in detail. -Her case was previously discussed in our GI tumor Board, Dr. Barry Dienes felt this is not resectable disease. -I previously reviewed the nature history of pancreatic cancer, which is aggressive. Her disease is incurable at this stage, and the goal of therapy is palliative and prolong her life. -she tolerated first line chemo gemcitabine poorly, with diarrhea, poor appetite, and skin rash, and was switched to second line Xeloda. - I previously reviewed her restaging CT scan from 10/29/15, which unfortunately showed disease progression in pancreas, no other metastasis.  -She has completed radiation. CA19.9 has decreased significantly after radiation, indicating good response. -I have requested MSI test on her tumor tissue to see if she is a candidate for immunotherapy, unfortunately there was not enough tissue for the test. -I previously reviewed her restaging CT scan from 02/28/2016, which showed decreased size of the pancreatic mass, no liver or other metastasis. -she has had recurrent GI bleeding, spontaneously resolved, and did not require any intervention. -She is little reluctant to repeat colonoscopy or EGD. -We previously reviewed her restaging CT scan from 07/04/2016, which showed a stable pancreatic mass, no metastasis. -She otherwise is clinically stable, anemia has improved, we'll continue observation and supportive care   2. Anemia -She has intermittent worsening anemia, from GI bleeding, iron deficiency. -Her anemia has improved after IV  Feraheme on 05/26/16 and 06/02/2016 -Ferritin on 07/02/16 was 238, which was a significant improvement following IV Faraheme. -she has not been taking an iron pill, but says she will start taking it again. I advised her she could take a multivitamin, still soft and with iron to avoid constipation. -We will continue to monitor and give IV iron as needed. -Anemia is mild and stable, overall improved  3. Left LE DVT -Probably provoked by her underlying malignancy and chemotherapy -I previously recommended anticoagulation indefinitely, giving her incurable malignancy, if no contraindications such as bleeding. -She has had recurrent  GI bleeding, Xarelto has been stopped   4. Fatigue, anorexia, and abdominal pain -improved some, she'll continue tramadol and Vicodin as needed. -I previously encouraged the patient to nap as needed, but then to remain active to improve her energy levels. -I previously encouraged the patient to eat smaller, more frequent meals to avoid gastric pain.  5.HTN, DM, hypothyroidism  -follow up with PCP   6. Depression  -We previously discussed the patient feeling depressed by many factors; the cold weather and her cancer diagnosis. -We previously discussed she is doing well from her cancer standpoint. I encouraged her to be positive.  -We previously discussed antidepressant medication, she has respectfully declined  -she has very good social support   7. Goal of care -We discussed her goal of care is palliative, giving the unresectable pancreatic cancer -Due to her advanced age and poor tolerance to chemotherapy, we'll continue palliative care alone. -We previously discussed hospice, she is not ready for it.    Plan  -lab and f/u in 6 weeks   All questions were answered. The patient knows to call the clinic with any problems, questions or concerns.  I spent 20 minutes counseling the patient face to face. The total time spent in the appointment was 30 minutes and  more than 50% was on counseling.  [name], MD 08/20/2016

## 2016-08-20 ENCOUNTER — Telehealth: Payer: Self-pay | Admitting: Hematology

## 2016-08-20 ENCOUNTER — Ambulatory Visit (HOSPITAL_BASED_OUTPATIENT_CLINIC_OR_DEPARTMENT_OTHER): Payer: Medicare Other | Admitting: Hematology

## 2016-08-20 ENCOUNTER — Other Ambulatory Visit (HOSPITAL_BASED_OUTPATIENT_CLINIC_OR_DEPARTMENT_OTHER): Payer: Medicare Other

## 2016-08-20 ENCOUNTER — Encounter: Payer: Self-pay | Admitting: Hematology

## 2016-08-20 VITALS — BP 150/78 | HR 63 | Temp 98.8°F | Resp 18 | Ht 66.5 in | Wt 161.5 lb

## 2016-08-20 DIAGNOSIS — E039 Hypothyroidism, unspecified: Secondary | ICD-10-CM | POA: Diagnosis not present

## 2016-08-20 DIAGNOSIS — E119 Type 2 diabetes mellitus without complications: Secondary | ICD-10-CM

## 2016-08-20 DIAGNOSIS — I10 Essential (primary) hypertension: Secondary | ICD-10-CM

## 2016-08-20 DIAGNOSIS — Z86718 Personal history of other venous thrombosis and embolism: Secondary | ICD-10-CM

## 2016-08-20 DIAGNOSIS — W19XXXD Unspecified fall, subsequent encounter: Secondary | ICD-10-CM

## 2016-08-20 DIAGNOSIS — D630 Anemia in neoplastic disease: Secondary | ICD-10-CM

## 2016-08-20 DIAGNOSIS — C257 Malignant neoplasm of other parts of pancreas: Secondary | ICD-10-CM | POA: Diagnosis not present

## 2016-08-20 DIAGNOSIS — D649 Anemia, unspecified: Secondary | ICD-10-CM

## 2016-08-20 DIAGNOSIS — Y92009 Unspecified place in unspecified non-institutional (private) residence as the place of occurrence of the external cause: Secondary | ICD-10-CM

## 2016-08-20 DIAGNOSIS — Z7901 Long term (current) use of anticoagulants: Secondary | ICD-10-CM

## 2016-08-20 DIAGNOSIS — C251 Malignant neoplasm of body of pancreas: Secondary | ICD-10-CM | POA: Diagnosis not present

## 2016-08-20 DIAGNOSIS — I959 Hypotension, unspecified: Secondary | ICD-10-CM

## 2016-08-20 LAB — CBC WITH DIFFERENTIAL/PLATELET
BASO%: 0.9 % (ref 0.0–2.0)
Basophils Absolute: 0 10*3/uL (ref 0.0–0.1)
EOS%: 2 % (ref 0.0–7.0)
Eosinophils Absolute: 0.1 10*3/uL (ref 0.0–0.5)
HCT: 34 % — ABNORMAL LOW (ref 34.8–46.6)
HGB: 11.1 g/dL — ABNORMAL LOW (ref 11.6–15.9)
LYMPH#: 0.7 10*3/uL — AB (ref 0.9–3.3)
LYMPH%: 23.9 % (ref 14.0–49.7)
MCH: 32.7 pg (ref 25.1–34.0)
MCHC: 32.6 g/dL (ref 31.5–36.0)
MCV: 100.3 fL (ref 79.5–101.0)
MONO#: 0.4 10*3/uL (ref 0.1–0.9)
MONO%: 12.1 % (ref 0.0–14.0)
NEUT#: 1.9 10*3/uL (ref 1.5–6.5)
NEUT%: 61.1 % (ref 38.4–76.8)
Platelets: 130 10*3/uL — ABNORMAL LOW (ref 145–400)
RBC: 3.39 10*6/uL — AB (ref 3.70–5.45)
RDW: 14.5 % (ref 11.2–14.5)
WBC: 3 10*3/uL — ABNORMAL LOW (ref 3.9–10.3)

## 2016-08-20 LAB — COMPREHENSIVE METABOLIC PANEL
ALT: 17 U/L (ref 0–55)
ANION GAP: 9 meq/L (ref 3–11)
AST: 19 U/L (ref 5–34)
Albumin: 3.8 g/dL (ref 3.5–5.0)
Alkaline Phosphatase: 62 U/L (ref 40–150)
BUN: 21.6 mg/dL (ref 7.0–26.0)
CHLORIDE: 105 meq/L (ref 98–109)
CO2: 25 meq/L (ref 22–29)
Calcium: 9.2 mg/dL (ref 8.4–10.4)
Creatinine: 1.3 mg/dL — ABNORMAL HIGH (ref 0.6–1.1)
EGFR: 40 mL/min/{1.73_m2} — ABNORMAL LOW (ref >90)
Glucose: 228 mg/dl — ABNORMAL HIGH (ref 70–140)
Potassium: 4.5 mEq/L (ref 3.5–5.1)
Sodium: 139 mEq/L (ref 136–145)
Total Bilirubin: 0.42 mg/dL (ref 0.20–1.20)
Total Protein: 6.9 g/dL (ref 6.4–8.3)

## 2016-08-20 LAB — FERRITIN: Ferritin: 104 ng/ml (ref 9–269)

## 2016-08-20 MED ORDER — HYDROCODONE-ACETAMINOPHEN 7.5-325 MG PO TABS: 1.0000 | ORAL_TABLET | Freq: Four times a day (QID) | ORAL | 0 refills | Status: DC | PRN

## 2016-08-20 NOTE — Telephone Encounter (Signed)
Gave patient dtr avs report and appointments for May. Message to Dr. Burr Medico to confirm 6 week lab/fu - 4/18 los not completed.

## 2016-08-21 LAB — CANCER ANTIGEN 19-9: CA 19-9: 137 U/mL — ABNORMAL HIGH (ref 0–35)

## 2016-09-09 MED FILL — HYDROCODON-APAP 7.5-325: 7.5-325 | 23 days supply | Qty: 90 | Fill #0

## 2016-09-11 IMAGING — CT CT CHEST W/ CM
2 of 9 series · 14 of 46 positions shown, 16 images · IV contrast (omnipaque)
Comparison: Chest CT 05/30/2015.  Abdominal pelvic CT 05/16/2015.

ADDENDUM:
Correction: The actual mass involving the pancreatic neck and body
has slightly decreased in size compared with the prior study, now
measuring 3.7 x 2.8 cm. There is adjacent persistent dilatation of
the pancreatic duct in the pancreatic tail. There is persistent
vascular encasement with chronic venous occlusion as described.
CLINICAL DATA: Restaging pancreatic cancer diagnosed 2 months ago.
Chemotherapy on hold. Previous appendectomy.

EXAM:
CT CHEST, ABDOMEN, AND PELVIS WITH CONTRAST
TECHNIQUE: Multidetector CT imaging of the chest, abdomen and pelvis was
performed following the standard protocol during bolus
administration of intravenous contrast.
CONTRAST:  100mL OMNIPAQUE IOHEXOL 300 MG/ML  SOLN

[Series 6: venous thins pacs · axial · portal-venous · 0.72mm/px · z∈[-570,-57]mm · 11 of 207 slices shown, 13 images]
[im 18/207  soft-tissue]
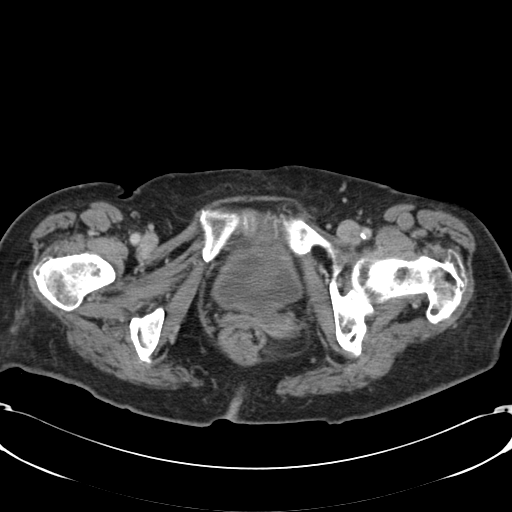
[im 18/207  bone]
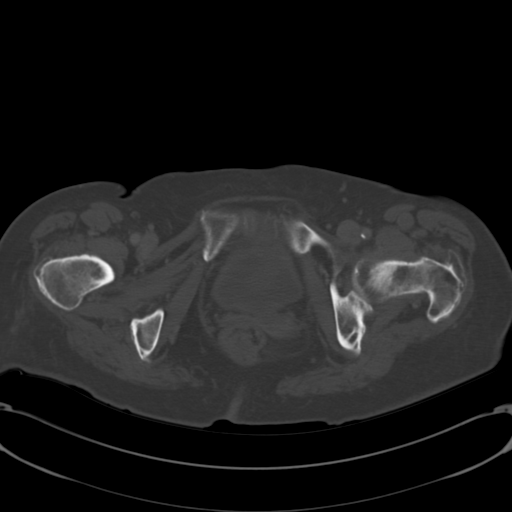
[im 35/207  soft-tissue]
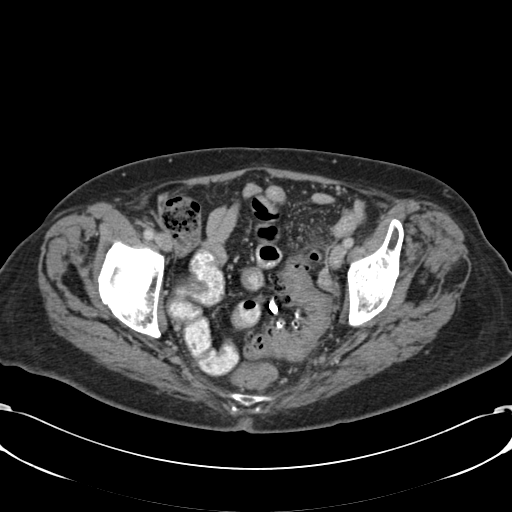
[im 52/207  soft-tissue]
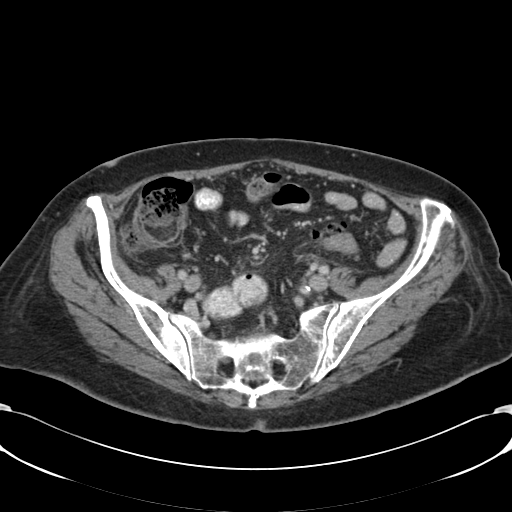
[im 69/207  soft-tissue]
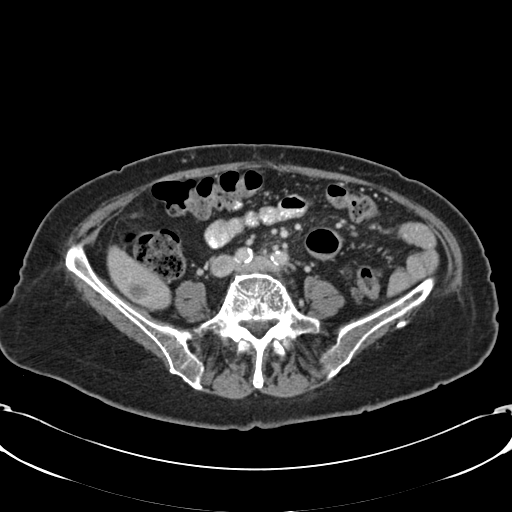
[im 86/207  soft-tissue]
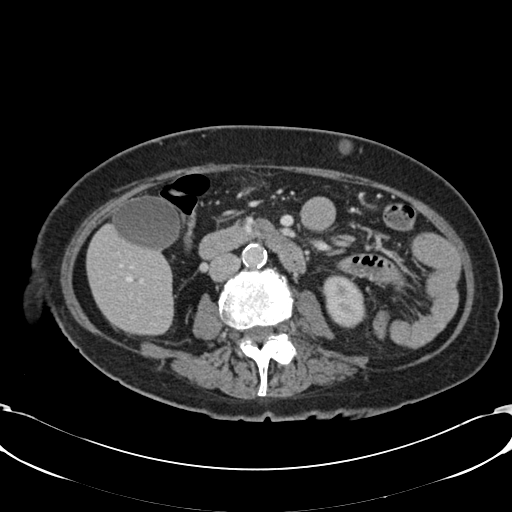
[im 104/207  soft-tissue]
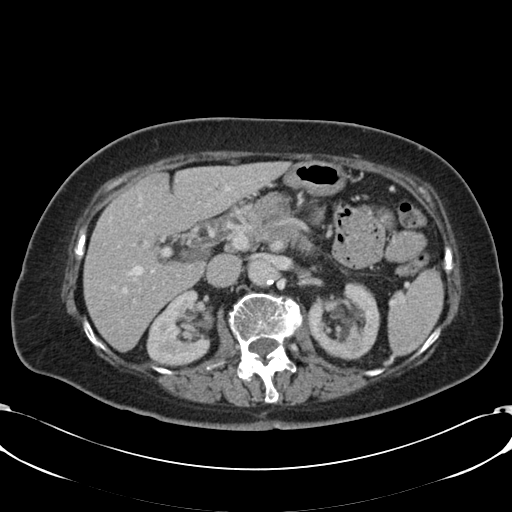
[im 121/207  soft-tissue]
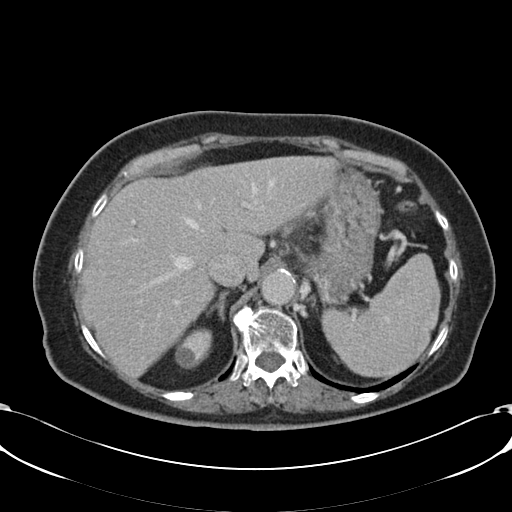
[im 138/207  soft-tissue]
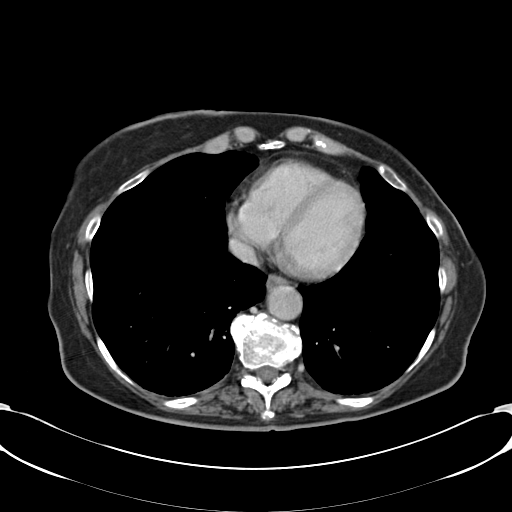
[im 155/207  soft-tissue]
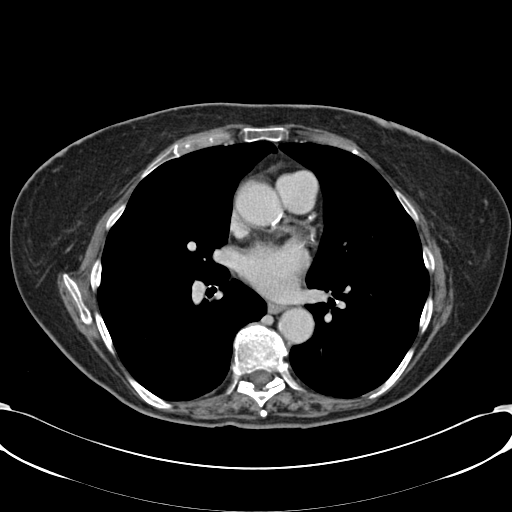
[im 155/207  bone]
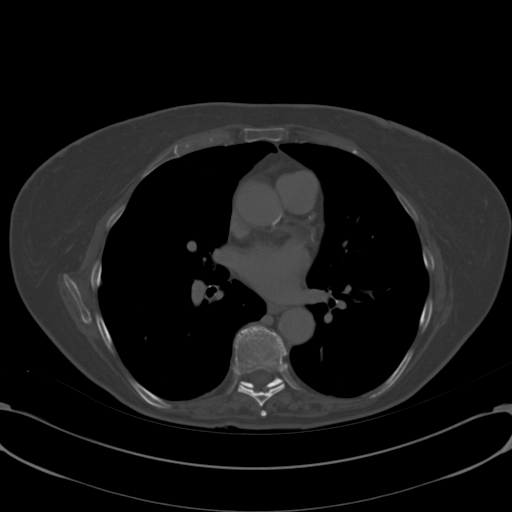
[im 172/207  soft-tissue]
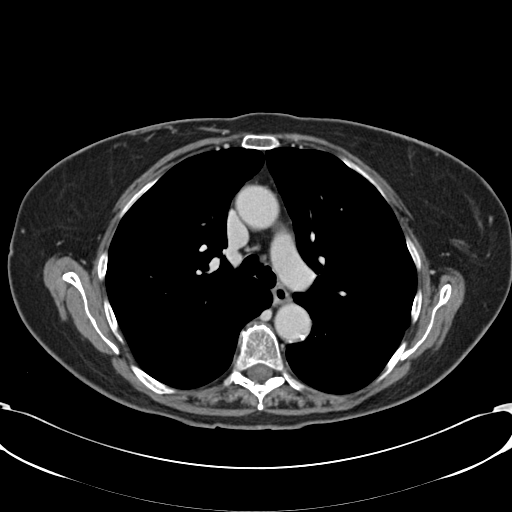
[im 189/207  soft-tissue]
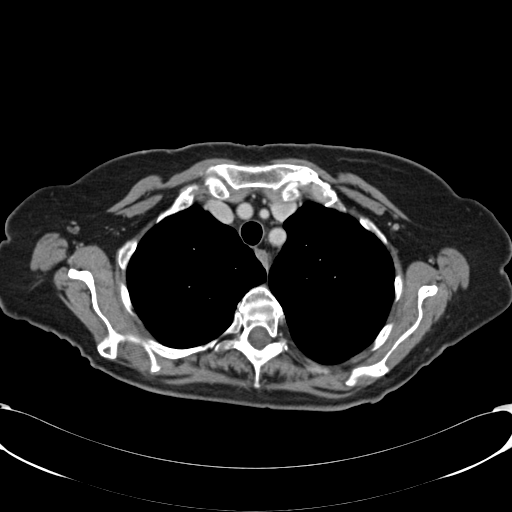

[Series 602: <mpr thick range> · coronal · 0.72mm/px · 3 of 116 slices shown]
[im 24/116  soft-tissue]
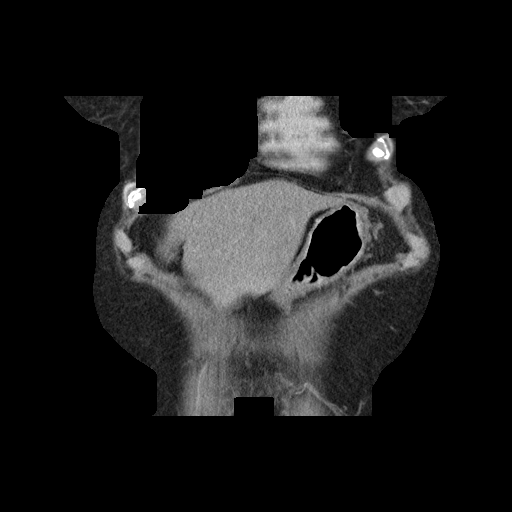
[im 47/116  soft-tissue]
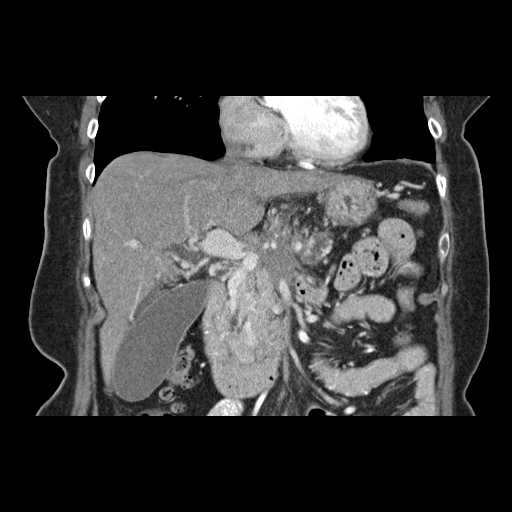
[im 70/116  soft-tissue]
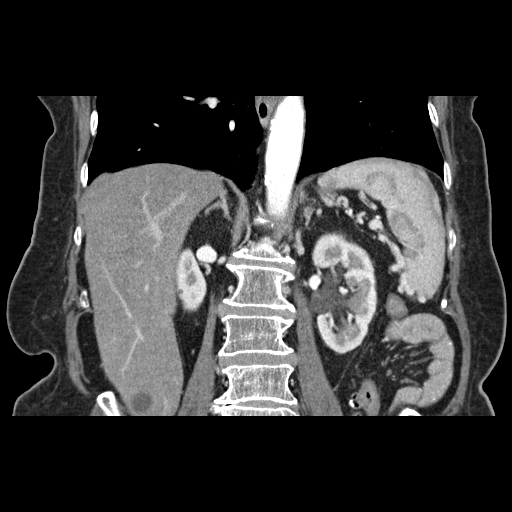

[14 of 46 positions shown; findings below may reference images not displayed]

FINDINGS: CT CHEST

Mediastinum/Nodes: There are no enlarged mediastinal, hilar or
axillary lymph nodes. Minimal thyroid tissue noted. The heart size
is normal. There is no pericardial effusion. Stable atherosclerosis
of the aorta, great vessels and coronary arteries.

Lungs/Pleura: There is no pleural effusion. Moderate emphysema. The
lungs are clear. There are no suspicious pulmonary nodules.

Musculoskeletal/Chest wall: No chest wall mass or suspicious osseous
findings.

CT ABDOMEN AND PELVIS FINDINGS

Hepatobiliary: No suspicious hepatic findings demonstrated. There
are probable transient perfusion defects in the dome of the right
hepatic lobe as well as more inferiorly. There is a stable 2.2 cm
cyst inferiorly in the right hepatic lobe. Stable extrahepatic
biliary dilatation. The gallbladder appears unremarkable without
surrounding inflammation.

Pancreas: Again demonstrated is a bulky hypovascular mass involving
the pancreatic neck and body, measuring up to 4.8 x 2.8 x 2.3 cm.
The pancreatic tail is atrophied. This mass encases the celiac
trunk. There is chronic venous occlusion at the confluence of the
splenic and superior mesenteric veins. There are multiple collateral
vessels reconstitute in the portal vein.

Spleen: Normal in size without focal abnormality.

Adrenals/Urinary Tract: Both adrenal glands appear normal. There are
no suspicious renal findings. There are renal cortical and
parapelvic cysts. No hydronephrosis or urinary tract calculus. The
bladder appears unremarkable.

Stomach/Bowel: No evidence of bowel wall thickening, distention or
surrounding inflammatory change. There are moderate diverticular
changes of the sigmoid colon.

Vascular/Lymphatic: There are no enlarged abdominal or pelvic lymph
nodes. Stable aortic and branch vessel atherosclerosis.

Reproductive: Unremarkable.

Other: No ascites or peritoneal nodularity. There are several soft
tissue nodules within the subcutaneous fat of the anterior abdominal
wall, likely injection granulomas.

Musculoskeletal: No acute or significant osseous findings. Mild
lumbar spondylosis appears unchanged.
IMPRESSION: 1. Stable chest CT without evidence of metastatic disease.
2. The bulky hypovascular mass involving the pancreatic neck and
body has significantly changed since the baseline study of 2 months
ago. This lesion appears unresectable with arterial encasement.
There is chronic venous occlusion with collaterals.
3. No discrete peripancreatic lymph nodes seen on the current
examination. No distant metastases identified.
4. Stable extrahepatic biliary dilatation, bilateral renal cysts,
hepatic cyst, colonic diverticulosis and atherosclerosis.

## 2016-09-25 NOTE — Progress Notes (Signed)
[place]  Telephone:([phone]) [phone] Fax:(336) [phone]  Clinic Follow Up Note   Patient Care Team: Hulan Fess, MD as PCP - General (Family Medicine) [name], MD as Consulting Physician (Hematology) Arta Silence, MD as Consulting Physician (Gastroenterology) 10/01/2016  CHIEF COMPLAINTS:  Follow up unresectable pancreatic adenocarcinoma    OTHER RELATED ISSUES 1. Left LE DVT diagnosed on 06/09/2015, started on lovenox and bridged to coumadin, which was held on 10/22/2068 due to GI bleeding. Changed to Xarelto on 10/31/2015, held due to GI bleeding   Oncology History   Pancreatic cancer [facility])   Staging form: Pancreas, AJCC 7th Edition     Clinical stage from 05/27/2014: Stage IIB (T2, N1, M0) - Signed by [name], MD on 05/31/2015       Carcinoma of body of pancreas ([place])   05/16/2015 Imaging    CT chest, abdomen and pelvis with contrast showed a bulky mass at pancreatic neck/body, tumor encases the celiac and proximal branches and a cruise the main portal vein, peripancreatic adenopathy. no distant mets.       05/28/2015 Initial Biopsy    Pancreatic mass needle biopsy showed well differentiated adenocarcinoma      05/31/2015 Initial Diagnosis    Pancreatic cancer ([place])      05/31/2015 Tumor Marker    CA19.9 =375      06/05/2015 - 07/03/2015 Chemotherapy    weekly gemcitabine 1048m/m2, dose reduced to 8581mm2 on week 2 due to tolerance issue      08/27/2015 - 10/30/2015 Chemotherapy    xeloda 100066mid, 14 days on, 7 days off, stopped due to disease progression       11/22/2015 - 12/05/2015 Radiation Therapy    Radiation to her pancreatic cancer       02/28/2016 Imaging    CT c/a/p with contrast IMPRESSION: 1. Decreased size of an infiltrative pancreatic head and neck mass, consistent with adenocarcinoma. Improvement in biliary duct dilatation. 2. No evidence of metastatic disease. 3. Similar arterial and venous involvement with  secondary gastroepiploic collaterals and gastric varices. 4. Similar small volume abdominal pelvic ascites. 5.  No acute process or evidence of metastatic disease in the chest. 6.  Coronary artery atherosclerosis. Aortic atherosclerosis. 7. Suspect gastric antral wall thickening, accentuated by underdistention. This may relate to radiation induced gastritis.      07/04/2016 Imaging    CT Abdomen Pelvis W contrast IMPRESSION: 1. No appreciable change in infiltrative hypoenhancing pancreatic body malignancy, with stable vascular involvement as detailed. 2. No evidence of metastatic disease in the abdomen or pelvis. 3. Stable mildly dilated central intrahepatic bile ducts and common bile duct. 4. Small volume free fluid in the deep pelvis. 5. Additional findings include aortic atherosclerosis, stable ectasia of the infrarenal abdominal aorta with maximum diameter 2.5 cm, decreased nonspecific mild wall thickening in the gastric antrum possibly representing evolving postradiation change, and diffuse colonic diverticulosis.      07/04/2016 Imaging    CT A/P IMPRESSION: 1. No appreciable change in infiltrative hypoenhancing pancreatic body malignancy, with stable vascular involvement as detailed. 2. No evidence of metastatic disease in the abdomen or pelvis. 3. Stable mildly dilated central intrahepatic bile ducts and common bile duct. 4. Small volume free fluid in the deep pelvis. 5. Additional findings include aortic atherosclerosis, stable ectasia of the infrarenal abdominal aorta with maximum diameter 2.5 cm, decreased nonspecific mild wall thickening in the gastric antrum possibly representing evolving postradiation change, and diffuse colonic diverticulosis.  HISTORY OF PRESENTING ILLNESS:  Nicole Sherman 83 y.o. female is here because of her recent abnormal CT scan, which showed a pink vaginal mass, highly suspicious for pancreatic cancer.  She has been haivng  epigastric pain after meal for 4-5 months. She also reports left side pain at night when she sleeps on left side, mild back pain, the pain is worse lately, lasts longer especially afte rdinner, it about 5/10, appetite is lower than before, she lost aobut 10-20lbs in the past 5 months. No other complains, she had loose BM 1-2 time BM for 8-10 months, no hematochezia or melena. She was evaluated by her primary care physician Dr. Rex Kras, abdominal ultrasound was negative on 05/04/2015. She underwent a CT abdomen and pelvis with contrast on 05/16/2015, which showed a 4 x 1 cm mass in pancreatic body and a neck, tumor encases the distal celiac and proximal branches and occludes the main portal vein. No distant metastasis on the CT scan. She was referred to [place] for further workup.  She has good energy level, she is able to do all house work. She has intermittent dizziness from blood pressure meds. No other complains.   CURRENT THERAPY: supportive care   INTERIM HISTORY: Nicole Sherman returns for follow up as scheduled. She presents to the clinic today with husband and daughter. She reports her energy has gown down and denies any bleeding. She has started back on the iron pill. Denies constipation of Diarrhea. She takes Hydrocodone 2-3 times a day and says it helps her. She denies nausea and her appetite is decent. Her daughter repots she is driving and she has not driven on the highway. She has no further complaints. The medication makes her drowsy so now she will take it in the eveining now.   Her daughter is leaving to go to [employer].    MEDICAL HISTORY:  Past Medical History:  Diagnosis Date  . Arthritis    osteoarthritis-hands wrist  . Cancer ([place])    pancreatic mass- dx. adenocarcinoma" CT abdomen 05-16-15  . Diabetes mellitus without complication ([place])   . Diverticulosis    showing on CT of abdomen 05-16-15  . Hypertension   . Hypothyroidism     SURGICAL HISTORY: Past Surgical History:   Procedure Laterality Date  . ANKLE SURGERY Right   . APPENDECTOMY     open  . CATARACT EXTRACTION Right   . EUS N/A 05/28/2015   Procedure: UPPER ENDOSCOPIC ULTRASOUND (EUS) LINEAR;  Surgeon: Arta Silence, MD;  Location: [facility] ENDOSCOPY;  Service: Endoscopy;  Laterality: N/A;  . EYE SURGERY     macular hole surgery repair  . JOINT REPLACEMENT Right   . SHOULDER ARTHROSCOPY W/ ROTATOR CUFF REPAIR Left   . SHOULDER OPEN ROTATOR CUFF REPAIR Right   . TUBAL LIGATION      SOCIAL HISTORY: Social History   Social History  . Marital Status: Married    Spouse Name: N/A  . Number of Children: 2 daughters   . Years of Education: N/A   Occupational History  . Not on file.   Social History Main Topics  . Smoking status: Former Smoker -- 1.00 packs/day for 30 years    Types: Cigarettes    Quit date: 05/21/1986  . Smokeless tobacco: Not on file  . Alcohol Use: No  . Drug Use: No  . Sexual Activity: Not on file   Other Topics Concern  . Not on file   Social History Narrative    FAMILY HISTORY:  Family History  Problem Relation Age of Onset  . Stroke Mother   . Cancer Sister        lung cancer     ALLERGIES:  is allergic to keflex [cephalexin] and aspirin.  MEDICATIONS:  Current Outpatient Prescriptions  Medication Sig Dispense Refill  . docusate sodium (COLACE) 100 MG capsule Take 100 mg by mouth 3 (three) times daily.    Marland Kitchen HYDROcodone-acetaminophen (NORCO) 7.5-325 MG tablet Take 1 tablet by mouth every 6 (six) hours as needed for moderate pain. 120 tablet 0  . levothyroxine (SYNTHROID, LEVOTHROID) 100 MCG tablet Take 100 mcg by mouth daily before breakfast.     . metFORMIN (GLUCOPHAGE) 1000 MG tablet Take 1,000 mg by mouth 2 (two) times daily with a meal.     . omeprazole (PRILOSEC OTC) 20 MG tablet Take 20 mg by mouth daily as needed (acid reflux). Reported on 11/23/2015    . ondansetron (ZOFRAN) 8 MG tablet TAKE ONE TABLET BY MOUTH TWICE DAILY AS NEEDED FOR NAUSEA AND  VOMITING 30 tablet 0  . prochlorperazine (COMPAZINE) 5 MG tablet Take 1-2 tablets (5-10 mg total) by mouth every 8 (eight) hours as needed for nausea or vomiting. 30 tablet 2  . sulfamethoxazole-trimethoprim (BACTRIM DS,SEPTRA DS) 800-160 MG tablet Take 1 tablet by mouth 2 (two) times daily. 14 tablet 0   No current facility-administered medications for this visit.     REVIEW OF SYSTEMS:  Constitutional: Denies fevers, chills or abnormal night sweats. (+) Fatigue (+) lower energy Eyes: Denies blurriness of vision, double vision or watery eyes Ears, nose, mouth, throat, and face: Denies mucositis or sore throat Respiratory: Denies cough, dyspnea or wheezes Cardiovascular: Denies palpitation, chest discomfort or lower extremity swelling Gastrointestinal:  Denies heartburn or change in bowel habits (+) constipation (+) R flank pain, nausea Skin: Denies abnormal skin rashes. Lymphatics: Denies new lymphadenopathy or easy bruising Neurological:Denies numbness, tingling or new weaknesses Behavioral/Psych: Mood is stable, no new changes  All other systems were reviewed with the patient and are negative.  PHYSICAL EXAMINATION: ECOG PERFORMANCE STATUS: 2  Vitals:   10/01/16 1319  BP: (!) 151/70  Pulse: 74  Resp: 18  Temp: 98.3 F (36.8 C)   Filed Weights   10/01/16 1319  Weight: 160 lb 6.4 oz (72.8 kg)     GENERAL:alert, oriented, appears to be pale  SKIN: skin color, texture, turgor are normal EYES: normal, conjunctiva are pink and non-injected, sclera clear OROPHARYNX:no exudate, no erythema and lips, buccal mucosa, and tongue normal  NECK: supple, thyroid normal size, non-tender, without nodularity LYMPH:  no palpable lymphadenopathy in the cervical, axillary or inguinal LUNGS: clear to auscultation and percussion with normal breathing effort HEART: regular rate & rhythm and no murmurs and no lower extremity edema ABDOMEN:abdomen soft, non-tender and normal bowel  sounds Musculoskeletal:no cyanosis of digits and no clubbing  PSYCH: alert & oriented x 3 with fluent speech NEURO: no focal motor/sensory deficits  LABORATORY DATA:  I have reviewed the data as listed  CBC Latest Ref Rng & Units 10/01/2016 08/20/2016 07/30/2016  WBC 3.9 - 10.3 10e3/uL 3.8(L) 3.0(L) 3.9  Hemoglobin 11.6 - 15.9 g/dL 11.0(L) 11.1(L) 11.2(L)  Hematocrit 34.8 - 46.6 % 33.7(L) 34.0(L) 33.5(L)  Platelets 145 - 400 10e3/uL 173 130(L) 163   CMP Latest Ref Rng & Units 10/01/2016 08/20/2016 07/30/2016  Glucose 70 - 140 mg/dl 208(H) 228(H) 174(H)  BUN 7.0 - 26.0 mg/dL 22.6 21.6 22.2  Creatinine 0.6 - 1.1 mg/dL 1.3(H) 1.3(H) 1.5(H)  Sodium 136 - 145 mEq/L 139 139 139  Potassium 3.5 - 5.1 mEq/L 4.2 4.5 4.3  Chloride 101 - 111 mmol/L - - -  CO2 22 - 29 mEq/L 25 25 25   Calcium 8.4 - 10.4 mg/dL 9.5 9.2 9.4  Total Protein 6.4 - 8.3 g/dL 7.2 6.9 7.0  Total Bilirubin 0.20 - 1.20 mg/dL 0.36 0.42 0.32  Alkaline Phos 40 - 150 U/L 71 62 65  AST 5 - 34 U/L 18 19 20   ALT 0 - 55 U/L 15 17 19     HL45.6 u/ML 05/31/2015: 751 07/03/2015: 227 07/19/2015: 159 08/23/2015: 304 09/03/2015: 309 10/15/2015: 357 12/31/2015: 121 01/30/2016: 77 02/28/2016: 51 04/17/16: 39 05/21/2015: 39 06/18/16: 56 07/30/16: 96 08/20/16: 137 10/01/16: PENDING  PATHOLOGY REPORT  Diagnosis 05/28/2015 FINE NEEDLE ASPIRATION: NEEDLE ASPIRATION, PANCREAS BODY (SPECIMEN 1 OF 1 COLLECTED 05/28/15): WELL DIFFERENTIATED ADENOCARCINOMA.   RADIOGRAPHIC STUDIES: I have personally reviewed the radiological images as listed and agreed with the findings in the report. No results found.   CT A/P 07/04/16 IMPRESSION: 1. No appreciable change in infiltrative hypoenhancing pancreatic body malignancy, with stable vascular involvement as detailed. 2. No evidence of metastatic disease in the abdomen or pelvis. 3. Stable mildly dilated central intrahepatic bile ducts and common bile duct. 4. Small volume free fluid in the deep  pelvis. 5. Additional findings include aortic atherosclerosis, stable ectasia of the infrarenal abdominal aorta with maximum diameter 2.5 cm, decreased nonspecific mild wall thickening in the gastric antrum possibly representing evolving postradiation change, and diffuse colonic diverticulosis.   ASSESSMENT & PLAN:  83 y.o.Caucasian female, presented with intermittent abdominal pain, weight loss, and a CT finding of a pancreatic mass.  1. Pancreatic body/head adenocarcinoma, well differentiated, cT2N1M0, stage IIB, unresectable -I previously reviewed her CT chest results, which was negative for metastatic disease. -The biopsy results was previously reviewed with her and her family members in detail. -Her case was previously discussed in our GI tumor Board, Dr. Barry Dienes felt this is not resectable disease. -I previously reviewed the nature history of pancreatic cancer, which is aggressive. Her disease is incurable at this stage, and the goal of therapy is palliative and prolong her life. -she tolerated first line chemo gemcitabine poorly, with diarrhea, poor appetite, and skin rash, and was switched to second line Xeloda. - I previously reviewed her restaging CT scan from 10/29/15, which unfortunately showed disease progression in pancreas, no other metastasis.  -She has completed radiation. CA19.9 has decreased significantly after radiation, indicating good response. -I have requested MSI test on her tumor tissue to see if she is a candidate for immunotherapy, unfortunately there was not enough tissue for the test. -I previously reviewed her restaging CT scan from 02/28/2016, which showed decreased size of the pancreatic mass, no liver or other metastasis. -she has had recurrent GI bleeding, spontaneously resolved, and did not require any intervention. -She is little reluctant to repeat colonoscopy or EGD. -We previously reviewed her restaging CT scan from 07/04/2016, which showed a stable  pancreatic mass, no metastasis. -She otherwise is clinically stable, anemia has improved, we'll continue observation and supportive care  -we discussed her cancer is stable and I encouraged her to be active and continue healthy and balanced diet.   2. Anemia -She has intermittent worsening anemia, from GI bleeding, iron deficiency. -Her anemia has improved after IV Feraheme on 05/26/16 and 06/02/2016 -Ferritin on 07/02/16 was 238, which was a significant improvement following IV Faraheme. -she has not been taking an iron pill, but  says she will start taking it again. I advised her she could take a multivitamin, still soft and with iron to avoid constipation. -We will continue to monitor and give IV iron as needed. -Anemia is mild and stable, overall improved previously -She is taking iron pill and anemia continues to improve.    3. Left LE DVT -Probably provoked by her underlying malignancy and chemotherapy -I previously recommended anticoagulation indefinitely, giving her incurable malignancy, if no contraindications such as bleeding. -She has had recurrent GI bleeding, Xarelto has been stopped   4. Fatigue, anorexia, and abdominal pain -improved some, she'll continue tramadol and Vicodin as needed. -I previously encouraged the patient to nap as needed, but then to remain active to improve her energy levels. -I previously encouraged the patient to eat smaller, more frequent meals to avoid gastric pain. appetite is good and she is gaining weight back.    5.HTN, DM, hypothyroidism  -follow up with PCP   6. Depression  -We previously discussed the patient feeling depressed by many factors; the cold weather and her cancer diagnosis. -We previously discussed she is doing well from her cancer standpoint. I encouraged her to be positive.  -We previously discussed antidepressant medication, she has respectfully declined  -she has very good social support   7. Goal of care -We discussed her  goal of care is palliative, giving the unresectable pancreatic cancer -Due to her advanced age and poor tolerance to chemotherapy, we'll continue palliative care alone. -We previously discussed hospice, she is not ready for it.    Plan  -lab and f/u in 6 weeks   All questions were answered. The patient knows to call the clinic with any problems, questions or concerns.  I spent 20 minutes counseling the patient face to face. The total time spent in the appointment was 30 minutes and more than 50% was on counseling.  This document serves as a record of services personally performed by [name], MD. It was created on her behalf by Joslyn Devon, a trained medical scribe. The creation of this record is based on the scribe's personal observations and the provider's statements to them. This document has been checked and approved by the attending provider.   [name], MD 10/01/2016

## 2016-10-01 ENCOUNTER — Telehealth: Payer: Self-pay | Admitting: Hematology

## 2016-10-01 ENCOUNTER — Ambulatory Visit (HOSPITAL_BASED_OUTPATIENT_CLINIC_OR_DEPARTMENT_OTHER): Payer: Medicare Other | Admitting: Hematology

## 2016-10-01 ENCOUNTER — Other Ambulatory Visit (HOSPITAL_BASED_OUTPATIENT_CLINIC_OR_DEPARTMENT_OTHER): Payer: Medicare Other

## 2016-10-01 VITALS — BP 151/70 | HR 74 | Temp 98.3°F | Resp 18 | Ht 66.5 in | Wt 160.4 lb

## 2016-10-01 DIAGNOSIS — D500 Iron deficiency anemia secondary to blood loss (chronic): Secondary | ICD-10-CM | POA: Diagnosis not present

## 2016-10-01 DIAGNOSIS — D630 Anemia in neoplastic disease: Secondary | ICD-10-CM

## 2016-10-01 DIAGNOSIS — E119 Type 2 diabetes mellitus without complications: Secondary | ICD-10-CM | POA: Diagnosis not present

## 2016-10-01 DIAGNOSIS — C250 Malignant neoplasm of head of pancreas: Secondary | ICD-10-CM

## 2016-10-01 DIAGNOSIS — Z86718 Personal history of other venous thrombosis and embolism: Secondary | ICD-10-CM

## 2016-10-01 DIAGNOSIS — C251 Malignant neoplasm of body of pancreas: Secondary | ICD-10-CM

## 2016-10-01 DIAGNOSIS — C257 Malignant neoplasm of other parts of pancreas: Secondary | ICD-10-CM

## 2016-10-01 LAB — COMPREHENSIVE METABOLIC PANEL
ALT: 15 U/L (ref 0–55)
ANION GAP: 9 meq/L (ref 3–11)
AST: 18 U/L (ref 5–34)
Albumin: 4 g/dL (ref 3.5–5.0)
Alkaline Phosphatase: 71 U/L (ref 40–150)
BUN: 22.6 mg/dL (ref 7.0–26.0)
CALCIUM: 9.5 mg/dL (ref 8.4–10.4)
CHLORIDE: 104 meq/L (ref 98–109)
CO2: 25 meq/L (ref 22–29)
Creatinine: 1.3 mg/dL — ABNORMAL HIGH (ref 0.6–1.1)
EGFR: 40 mL/min/{1.73_m2} — AB (ref >90)
Glucose: 208 mg/dl — ABNORMAL HIGH (ref 70–140)
POTASSIUM: 4.2 meq/L (ref 3.5–5.1)
Sodium: 139 mEq/L (ref 136–145)
Total Bilirubin: 0.36 mg/dL (ref 0.20–1.20)
Total Protein: 7.2 g/dL (ref 6.4–8.3)

## 2016-10-01 LAB — CBC WITH DIFFERENTIAL/PLATELET
BASO%: 0.3 % (ref 0.0–2.0)
BASOS ABS: 0 10*3/uL (ref 0.0–0.1)
EOS%: 1.9 % (ref 0.0–7.0)
Eosinophils Absolute: 0.1 10*3/uL (ref 0.0–0.5)
HEMATOCRIT: 33.7 % — AB (ref 34.8–46.6)
HGB: 11 g/dL — ABNORMAL LOW (ref 11.6–15.9)
LYMPH#: 0.9 10*3/uL (ref 0.9–3.3)
LYMPH%: 22.8 % (ref 14.0–49.7)
MCH: 33 pg (ref 25.1–34.0)
MCHC: 32.6 g/dL (ref 31.5–36.0)
MCV: 101.2 fL — ABNORMAL HIGH (ref 79.5–101.0)
MONO#: 0.3 10*3/uL (ref 0.1–0.9)
MONO%: 8.2 % (ref 0.0–14.0)
NEUT#: 2.5 10*3/uL (ref 1.5–6.5)
NEUT%: 66.8 % (ref 38.4–76.8)
Platelets: 173 10*3/uL (ref 145–400)
RBC: 3.33 10*6/uL — AB (ref 3.70–5.45)
RDW: 13.3 % (ref 11.2–14.5)
WBC: 3.8 10*3/uL — ABNORMAL LOW (ref 3.9–10.3)

## 2016-10-01 LAB — FERRITIN: FERRITIN: 127 ng/mL (ref 9–269)

## 2016-10-01 MED ORDER — HYDROCODONE-ACETAMINOPHEN 7.5-325 MG PO TABS: 1.0000 | ORAL_TABLET | Freq: Four times a day (QID) | ORAL | 0 refills | Status: DC | PRN

## 2016-10-01 NOTE — Telephone Encounter (Signed)
Gave patient AVS and calender per 5/30 los. Lab and f/u in 6 weeks.

## 2016-10-02 LAB — CANCER ANTIGEN 19-9: CA 19-9: 279 U/mL — ABNORMAL HIGH (ref 0–35)

## 2016-10-03 ENCOUNTER — Encounter: Payer: Self-pay | Admitting: Hematology

## 2016-10-14 MED FILL — HYDROCODON-APAP 7.5-325: 7.5-325 | 30 days supply | Qty: 120 | Fill #0

## 2016-11-11 NOTE — Progress Notes (Signed)
[name]  [phone] [phone]  Clinic Follow Up Note   Patient Care Team: Hulan Fess, MD as PCP - General (Family Medicine) [name], MD as Consulting Physician (Hematology) Arta Silence, MD as Consulting Physician (Gastroenterology) 11/12/2016  CHIEF COMPLAINTS:  Follow up unresectable pancreatic adenocarcinoma    OTHER RELATED ISSUES 1. Left LE DVT diagnosed on 06/09/2015, started on lovenox and bridged to coumadin, which was held on 10/22/2068 due to GI bleeding. Changed to Xarelto on 10/31/2015, held due to GI bleeding   Oncology History   Pancreatic cancer [facility])   Staging form: Pancreas, AJCC 7th Edition     Clinical stage from 05/27/2014: Stage IIB (T2, N1, M0) - Signed by [name], MD on 05/31/2015       Carcinoma of body of pancreas ([place])   05/16/2015 Imaging    CT chest, abdomen and pelvis with contrast showed a bulky mass at pancreatic neck/body, tumor encases the celiac and proximal branches and a cruise the main portal vein, peripancreatic adenopathy. no distant mets.       05/28/2015 Initial Biopsy    Pancreatic mass needle biopsy showed well differentiated adenocarcinoma      05/31/2015 Initial Diagnosis    Pancreatic cancer ([place])      05/31/2015 Tumor Marker    CA19.9 =375      06/05/2015 - 07/03/2015 Chemotherapy    weekly gemcitabine 1044m/m2, dose reduced to 8571mm2 on week 2 due to tolerance issue      08/27/2015 - 10/30/2015 Chemotherapy    xeloda 100047mid, 14 days on, 7 days off, stopped due to disease progression       11/22/2015 - 12/05/2015 Radiation Therapy    Radiation to her pancreatic cancer       02/28/2016 Imaging    CT c/a/p with contrast IMPRESSION: 1. Decreased size of an infiltrative pancreatic head and neck mass, consistent with adenocarcinoma. Improvement in biliary duct dilatation. 2. No evidence of metastatic disease. 3. Similar arterial and venous involvement with  secondary gastroepiploic collaterals and gastric varices. 4. Similar small volume abdominal pelvic ascites. 5.  No acute process or evidence of metastatic disease in the chest. 6.  Coronary artery atherosclerosis. Aortic atherosclerosis. 7. Suspect gastric antral wall thickening, accentuated by underdistention. This may relate to radiation induced gastritis.      07/04/2016 Imaging    CT Abdomen Pelvis W contrast IMPRESSION: 1. No appreciable change in infiltrative hypoenhancing pancreatic body malignancy, with stable vascular involvement as detailed. 2. No evidence of metastatic disease in the abdomen or pelvis. 3. Stable mildly dilated central intrahepatic bile ducts and common bile duct. 4. Small volume free fluid in the deep pelvis. 5. Additional findings include aortic atherosclerosis, stable ectasia of the infrarenal abdominal aorta with maximum diameter 2.5 cm, decreased nonspecific mild wall thickening in the gastric antrum possibly representing evolving postradiation change, and diffuse colonic diverticulosis.      07/04/2016 Imaging    CT A/P IMPRESSION: 1. No appreciable change in infiltrative hypoenhancing pancreatic body malignancy, with stable vascular involvement as detailed. 2. No evidence of metastatic disease in the abdomen or pelvis. 3. Stable mildly dilated central intrahepatic bile ducts and common bile duct. 4. Small volume free fluid in the deep pelvis. 5. Additional findings include aortic atherosclerosis, stable ectasia of the infrarenal abdominal aorta with maximum diameter 2.5 cm, decreased nonspecific mild wall thickening in the gastric antrum possibly representing evolving postradiation change, and diffuse colonic diverticulosis.  HISTORY OF PRESENTING ILLNESS:  Nicole Sherman 83 y.o. female is here because of her recent abnormal CT scan, which showed a pink vaginal mass, highly suspicious for pancreatic cancer.  She has been haivng  epigastric pain after meal for 4-5 months. She also reports left side pain at night when she sleeps on left side, mild back pain, the pain is worse lately, lasts longer especially afte rdinner, it about 5/10, appetite is lower than before, she lost aobut 10-20lbs in the past 5 months. No other complains, she had loose BM 1-2 time BM for 8-10 months, no hematochezia or melena. She was evaluated by her primary care physician Dr. Rex Kras, abdominal ultrasound was negative on 05/04/2015. She underwent a CT abdomen and pelvis with contrast on 05/16/2015, which showed a 4 x 1 cm mass in pancreatic body and a neck, tumor encases the distal celiac and proximal branches and occludes the main portal vein. No distant metastasis on the CT scan. She was referred to [place] for further workup.  She has good energy level, she is able to do all house work. She has intermittent dizziness from blood pressure meds. No other complains.   CURRENT THERAPY: supportive care   INTERIM HISTORY:  [name] returns for follow up as scheduled. She presents to the clinic today with her husband and daughter. She reports to having a bad month. She does not feel well. She has trouble with constipation and every 2 days she has to take a stool softener and laxative to make herself go. Her daughter reports to her taking 3 at one time. That resulted in cramps and diarrhea. She was sleeping a lot. She had not tried miralax or [place] before.  With constipation she will feel nauseous occasionally along with cramps. She reports yesterday was her worse day so far. Her daughter says these episode are becoming more frequent.  She reports to being able to move around at home. Her daughter thinks she needs to get a cane.      MEDICAL HISTORY:  Past Medical History:  Diagnosis Date  . Arthritis    osteoarthritis-hands wrist  . Cancer ([place])    pancreatic mass- dx. adenocarcinoma" CT abdomen 05-16-15  . Diabetes mellitus without complication ([place])    . Diverticulosis    showing on CT of abdomen 05-16-15  . Hypertension   . Hypothyroidism     SURGICAL HISTORY: Past Surgical History:  Procedure Laterality Date  . ANKLE SURGERY Right   . APPENDECTOMY     open  . CATARACT EXTRACTION Right   . EUS N/A 05/28/2015   Procedure: UPPER ENDOSCOPIC ULTRASOUND (EUS) LINEAR;  Surgeon: Arta Silence, MD;  Location: [facility] ENDOSCOPY;  Service: Endoscopy;  Laterality: N/A;  . EYE SURGERY     macular hole surgery repair  . JOINT REPLACEMENT Right   . SHOULDER ARTHROSCOPY W/ ROTATOR CUFF REPAIR Left   . SHOULDER OPEN ROTATOR CUFF REPAIR Right   . TUBAL LIGATION      SOCIAL HISTORY: Social History   Social History  . Marital Status: Married    Spouse Name: N/A  . Number of Children: 2 daughters   . Years of Education: N/A   Occupational History  . Not on file.   Social History Main Topics  . Smoking status: Former Smoker -- 1.00 packs/day for 30 years    Types: Cigarettes    Quit date: 05/21/1986  . Smokeless tobacco: Not on file  . Alcohol Use: No  . Drug Use:  No  . Sexual Activity: Not on file   Other Topics Concern  . Not on file   Social History Narrative    FAMILY HISTORY: Family History  Problem Relation Age of Onset  . Stroke Mother   . Cancer Sister        lung cancer     ALLERGIES:  is allergic to keflex [cephalexin] and aspirin.  MEDICATIONS:  Current Outpatient Prescriptions  Medication Sig Dispense Refill  . docusate sodium (COLACE) 100 MG capsule Take 100 mg by mouth 3 (three) times daily.    Marland Kitchen HYDROcodone-acetaminophen (NORCO) 7.5-325 MG tablet Take 1 tablet by mouth every 6 (six) hours as needed for moderate pain. 120 tablet 0  . levothyroxine (SYNTHROID, LEVOTHROID) 100 MCG tablet Take 100 mcg by mouth daily before breakfast.     . metFORMIN (GLUCOPHAGE) 1000 MG tablet Take 1,000 mg by mouth 2 (two) times daily with a meal.     . omeprazole (PRILOSEC OTC) 20 MG tablet Take 20 mg by mouth daily as  needed (acid reflux). Reported on 11/23/2015    . ondansetron (ZOFRAN) 8 MG tablet TAKE ONE TABLET BY MOUTH TWICE DAILY AS NEEDED FOR NAUSEA AND VOMITING (Patient not taking: Reported on 11/12/2016) 30 tablet 0  . prochlorperazine (COMPAZINE) 5 MG tablet Take 1-2 tablets (5-10 mg total) by mouth every 8 (eight) hours as needed for nausea or vomiting. (Patient not taking: Reported on 11/12/2016) 30 tablet 2   No current facility-administered medications for this visit.     REVIEW OF SYSTEMS:  Constitutional: Denies fevers, chills or abnormal night sweats. (+) Fatigue (+) lower energy Eyes: Denies blurriness of vision, double vision or watery eyes Ears, nose, mouth, throat, and face: Denies mucositis or sore throat Respiratory: Denies cough, dyspnea or wheezes Cardiovascular: Denies palpitation, chest discomfort or lower extremity swelling Gastrointestinal:  Denies heartburn or change in bowel habits (+) constipation (+) R flank pain, nausea (+) occasional diarrhea Skin: Denies abnormal skin rashes. Lymphatics: Denies new lymphadenopathy or easy bruising Neurological:Denies numbness, tingling or new weaknesses Behavioral/Psych: Mood is stable, no new changes  All other systems were reviewed with the patient and are negative.  PHYSICAL EXAMINATION: ECOG PERFORMANCE STATUS: 2  Vitals:   11/12/16 1354  BP: (!) 148/80  Pulse: 61  Resp: 18  Temp: 98.7 F (37.1 C)   Filed Weights   11/12/16 1354  Weight: 159 lb 11.2 oz (72.4 kg)     GENERAL:alert, oriented, appears to be pale  SKIN: skin color, texture, turgor are normal EYES: normal, conjunctiva are pink and non-injected, sclera clear OROPHARYNX:no exudate, no erythema and lips, buccal mucosa, and tongue normal  NECK: supple, thyroid normal size, non-tender, without nodularity LYMPH:  no palpable lymphadenopathy in the cervical, axillary or inguinal LUNGS: clear to auscultation and percussion with normal breathing effort HEART:  regular rate & rhythm and no murmurs and no lower extremity edema ABDOMEN:abdomen soft, non-tender and normal bowel sounds (+) bowel is slow Musculoskeletal:no cyanosis of digits and no clubbing  PSYCH: alert & oriented x 3 with fluent speech NEURO: no focal motor/sensory deficits  LABORATORY DATA:  I have reviewed the data as listed  CBC Latest Ref Rng & Units 11/12/2016 10/01/2016 08/20/2016  WBC 3.9 - 10.3 10e3/uL 4.2 3.8(L) 3.0(L)  Hemoglobin 11.6 - 15.9 g/dL 10.9(L) 11.0(L) 11.1(L)  Hematocrit 34.8 - 46.6 % 32.7(L) 33.7(L) 34.0(L)  Platelets 145 - 400 10e3/uL 180 173 130(L)   CMP Latest Ref Rng & Units 11/12/2016 10/01/2016 08/20/2016  Glucose 70 - 140 mg/dl 326(H) 208(H) 228(H)  BUN 7.0 - 26.0 mg/dL 20.1 22.6 21.6  Creatinine 0.6 - 1.1 mg/dL 1.4(H) 1.3(H) 1.3(H)  Sodium 136 - 145 mEq/L 139 139 139  Potassium 3.5 - 5.1 mEq/L 4.4 4.2 4.5  Chloride 101 - 111 mmol/L - - -  CO2 22 - 29 mEq/L 26 25 25   Calcium 8.4 - 10.4 mg/dL 9.8 9.5 9.2  Total Protein 6.4 - 8.3 g/dL 7.1 7.2 6.9  Total Bilirubin 0.20 - 1.20 mg/dL 0.39 0.36 0.42  Alkaline Phos 40 - 150 U/L 74 71 62  AST 5 - 34 U/L 15 18 19   ALT 0 - 55 U/L 12 15 17     CA19.9 u/ML 05/31/2015: 751 07/03/2015: 227 07/19/2015: 159 08/23/2015: 304 09/03/2015: 309 10/15/2015: 357 12/31/2015: 121 01/30/2016: 77 02/28/2016: 51 04/17/16: 39 05/21/2015: 39 06/18/16: 56 07/30/16: 96 08/20/16: 137 10/01/16: 279   PATHOLOGY REPORT  Diagnosis 05/28/2015 FINE NEEDLE ASPIRATION: NEEDLE ASPIRATION, PANCREAS BODY (SPECIMEN 1 OF 1 COLLECTED 05/28/15): WELL DIFFERENTIATED ADENOCARCINOMA.   RADIOGRAPHIC STUDIES: I have personally reviewed the radiological images as listed and agreed with the findings in the report. No results found.   CT A/P 07/04/16 IMPRESSION: 1. No appreciable change in infiltrative hypoenhancing pancreatic body malignancy, with stable vascular involvement as detailed. 2. No evidence of metastatic disease in the abdomen or  pelvis. 3. Stable mildly dilated central intrahepatic bile ducts and common bile duct. 4. Small volume free fluid in the deep pelvis. 5. Additional findings include aortic atherosclerosis, stable ectasia of the infrarenal abdominal aorta with maximum diameter 2.5 cm, decreased nonspecific mild wall thickening in the gastric antrum possibly representing evolving postradiation change, and diffuse colonic diverticulosis.   ASSESSMENT & PLAN:  83 y.o.Caucasian female, presented with intermittent abdominal pain, weight loss, and a CT finding of a pancreatic mass.  1. Pancreatic body/head adenocarcinoma, well differentiated, cT2N1M0, stage IIB, unresectable -I previously reviewed her CT chest results, which was negative for metastatic disease. -The biopsy results was previously reviewed with her and her family members in detail. -Her case was previously discussed in our GI tumor Board, Dr. Barry Dienes felt this is not resectable disease. -I previously reviewed the nature history of pancreatic cancer, which is aggressive. Her disease is incurable at this stage, and the goal of therapy is palliative and prolong her life. -she tolerated first line chemo gemcitabine poorly, with diarrhea, poor appetite, and skin rash, and was switched to second line Xeloda, which was stopped due to disease progression. - I previously reviewed her restaging CT scan from 10/29/15, which unfortunately showed disease progression in pancreas, no other metastasis.  -She has completed radiation. CA19.9 has decreased significantly after radiation, indicating good response. -I have requested MSI test on her tumor tissue to see if she is a candidate for immunotherapy, unfortunately there was not enough tissue for the test. -I previously reviewed her restaging CT scan from 02/28/2016, which showed decreased size of the pancreatic mass, no liver or other metastasis. -she has had recurrent GI bleeding, spontaneously resolved, and did not  require any intervention. -She is little reluctant to repeat colonoscopy or EGD. -We previously reviewed her restaging CT scan from 07/04/2016, which showed a stable pancreatic mass, no metastasis. -She otherwise is clinically stable, anemia has improved, we'll continue observation and supportive care  -we previously discussed her cancer is stable and I encouraged her to be active and continue healthy and balanced diet.  -Labs reviewed and I encourage her to drink more water  so she can stay hydrated and watch her sugar as it was high today at 300.  -I suggest she gets a cane to help with her ambulation.  -I encouraged her to contact us sooner if she feels worse -Her tumor marker has significantly increased lately, she's slightly to be more fatigued with slightly worse pain, I'm sure her cancer has progressed. Giving the palliative care nature, I do not plan to repeat CT scan, unless she has focal symptoms which may benefit from palliative radiation, then I would consider repeating scan. -She previously has declined hospice. She would like to follow up with me in the clinic.   2. Anemia -She has intermittent worsening anemia, from GI bleeding, iron deficiency. -Her anemia has improved after IV Feraheme on 05/26/16 and 06/02/2016 -Ferritin on 07/02/16 was 238, which was a significant improvement following IV Faraheme. -she has not been taking an iron pill, but says she will start taking it again. I advised her she could take a multivitamin, still soft and with iron to avoid constipation. -We will continue to monitor and give IV iron as needed. -Anemia is mild and stable, overall improved previously -She is taking iron pill and anemia continues to improve.    3. Left LE DVT -Probably provoked by her underlying malignancy and chemotherapy -I previously recommended anticoagulation indefinitely, giving her incurable malignancy, if no contraindications such as bleeding. -She has had recurrent GI  bleeding, Xarelto has been stopped   4. Fatigue, anorexia, and abdominal pain -improved some, she'll continue tramadol and Vicodin as needed. -I previously encouraged the patient to nap as needed, but then to remain active to improve her energy levels. -I previously encouraged the patient to eat smaller, more frequent meals to avoid gastric pain. appetite is good and she is gaining weight back.    5.HTN, DM, hypothyroidism  -follow up with PCP   6. Depression  -We previously discussed the patient feeling depressed by many factors; the cold weather and her cancer diagnosis. -We previously discussed she is doing well from her cancer standpoint. I encouraged her to be positive.  -We previously discussed antidepressant medication, she has respectfully declined  -she has very good social support   7. Goal of care -We previously discussed her goal of care is palliative, giving the unresectable pancreatic cancer -Due to her advanced age and poor tolerance to chemotherapy, we'll continue palliative care alone. -We previously discussed hospice, she is not ready for it.   8. Constipation -She has ongoing constipation, worse lately. So has tried stool softener and laxatives -this occurs with cramps, nausea and occasional diarrhea -I suggest using Miralax  to use as it is mild and should not cause diarrhea. She can also try [place].  -I will order a ABD XRAY today to see if there is a SBO -Based on PE her bowel is slow moving and it is likely that constipation is from the pain medication.   Plan  -refill hydrocodone  -ABD Xray Today, and will call daughters with results -lab and f/u in 6 weeks or sooner if she feels worse.    All questions were answered. The patient knows to call the clinic with any problems, questions or concerns.  I spent 20 minutes counseling the patient face to face. The total time spent in the appointment was 30 minutes and more than 50% was on counseling.  This  document serves as a record of services personally performed by [name], MD. It was created on her behalf by  Joslyn Devon, a trained medical scribe. The creation of this record is based on the scribe's personal observations and the provider's statements to them. This document has been checked and approved by the attending provider.   [name], MD 11/12/2016   Addendum Her abdominal x-ray was normal, no evidence of bowel obstruction. My nurse has called her daughter and discussed the x-ray findings.  [name]  11/14/2016

## 2016-11-12 ENCOUNTER — Other Ambulatory Visit (HOSPITAL_BASED_OUTPATIENT_CLINIC_OR_DEPARTMENT_OTHER): Payer: Medicare Other

## 2016-11-12 ENCOUNTER — Encounter: Payer: Self-pay | Admitting: Hematology

## 2016-11-12 ENCOUNTER — Telehealth: Payer: Self-pay | Admitting: Hematology

## 2016-11-12 ENCOUNTER — Ambulatory Visit (HOSPITAL_BASED_OUTPATIENT_CLINIC_OR_DEPARTMENT_OTHER): Payer: Medicare Other | Admitting: Hematology

## 2016-11-12 ENCOUNTER — Ambulatory Visit (HOSPITAL_COMMUNITY)
Admission: RE | Admit: 2016-11-12 | Discharge: 2016-11-12 | Disposition: A | Discharge status: Home / Self Care | Payer: Medicare Other | Source: Ambulatory Visit | Attending: Hematology | Admitting: Hematology

## 2016-11-12 VITALS — BP 148/80 | HR 61 | Temp 98.7°F | Resp 18 | Ht 66.5 in | Wt 159.7 lb

## 2016-11-12 DIAGNOSIS — F329 Major depressive disorder, single episode, unspecified: Secondary | ICD-10-CM

## 2016-11-12 DIAGNOSIS — C257 Malignant neoplasm of other parts of pancreas: Secondary | ICD-10-CM | POA: Diagnosis not present

## 2016-11-12 DIAGNOSIS — R110 Nausea: Secondary | ICD-10-CM | POA: Diagnosis present

## 2016-11-12 DIAGNOSIS — R5383 Other fatigue: Secondary | ICD-10-CM | POA: Diagnosis not present

## 2016-11-12 DIAGNOSIS — C250 Malignant neoplasm of head of pancreas: Secondary | ICD-10-CM

## 2016-11-12 DIAGNOSIS — E039 Hypothyroidism, unspecified: Secondary | ICD-10-CM | POA: Diagnosis not present

## 2016-11-12 DIAGNOSIS — K5900 Constipation, unspecified: Secondary | ICD-10-CM | POA: Diagnosis present

## 2016-11-12 DIAGNOSIS — R630 Anorexia: Secondary | ICD-10-CM

## 2016-11-12 DIAGNOSIS — C251 Malignant neoplasm of body of pancreas: Secondary | ICD-10-CM | POA: Diagnosis not present

## 2016-11-12 DIAGNOSIS — I10 Essential (primary) hypertension: Secondary | ICD-10-CM | POA: Diagnosis not present

## 2016-11-12 DIAGNOSIS — R109 Unspecified abdominal pain: Secondary | ICD-10-CM

## 2016-11-12 DIAGNOSIS — Z8507 Personal history of malignant neoplasm of pancreas: Secondary | ICD-10-CM | POA: Diagnosis not present

## 2016-11-12 DIAGNOSIS — D630 Anemia in neoplastic disease: Secondary | ICD-10-CM

## 2016-11-12 DIAGNOSIS — D649 Anemia, unspecified: Secondary | ICD-10-CM

## 2016-11-12 DIAGNOSIS — Z7189 Other specified counseling: Secondary | ICD-10-CM

## 2016-11-12 DIAGNOSIS — E119 Type 2 diabetes mellitus without complications: Secondary | ICD-10-CM | POA: Diagnosis not present

## 2016-11-12 DIAGNOSIS — I82402 Acute embolism and thrombosis of unspecified deep veins of left lower extremity: Secondary | ICD-10-CM

## 2016-11-12 LAB — CBC WITH DIFFERENTIAL/PLATELET
BASO%: 0.8 % (ref 0.0–2.0)
BASOS ABS: 0 10*3/uL (ref 0.0–0.1)
EOS%: 3.3 % (ref 0.0–7.0)
Eosinophils Absolute: 0.1 10*3/uL (ref 0.0–0.5)
HEMATOCRIT: 32.7 % — AB (ref 34.8–46.6)
HGB: 10.9 g/dL — ABNORMAL LOW (ref 11.6–15.9)
LYMPH#: 0.8 10*3/uL — AB (ref 0.9–3.3)
LYMPH%: 19.7 % (ref 14.0–49.7)
MCH: 33.2 pg (ref 25.1–34.0)
MCHC: 33.2 g/dL (ref 31.5–36.0)
MCV: 99.9 fL (ref 79.5–101.0)
MONO#: 0.5 10*3/uL (ref 0.1–0.9)
MONO%: 11.3 % (ref 0.0–14.0)
NEUT#: 2.7 10*3/uL (ref 1.5–6.5)
NEUT%: 64.9 % (ref 38.4–76.8)
PLATELETS: 180 10*3/uL (ref 145–400)
RBC: 3.27 10*6/uL — AB (ref 3.70–5.45)
RDW: 13.8 % (ref 11.2–14.5)
WBC: 4.2 10*3/uL (ref 3.9–10.3)

## 2016-11-12 LAB — COMPREHENSIVE METABOLIC PANEL
ALT: 12 U/L (ref 0–55)
ANION GAP: 9 meq/L (ref 3–11)
AST: 15 U/L (ref 5–34)
Albumin: 3.9 g/dL (ref 3.5–5.0)
Alkaline Phosphatase: 74 U/L (ref 40–150)
BUN: 20.1 mg/dL (ref 7.0–26.0)
CALCIUM: 9.8 mg/dL (ref 8.4–10.4)
CHLORIDE: 103 meq/L (ref 98–109)
CO2: 26 meq/L (ref 22–29)
Creatinine: 1.4 mg/dL — ABNORMAL HIGH (ref 0.6–1.1)
EGFR: 36 mL/min/{1.73_m2} — AB (ref >90)
Glucose: 326 mg/dl — ABNORMAL HIGH (ref 70–140)
POTASSIUM: 4.4 meq/L (ref 3.5–5.1)
Sodium: 139 mEq/L (ref 136–145)
Total Bilirubin: 0.39 mg/dL (ref 0.20–1.20)
Total Protein: 7.1 g/dL (ref 6.4–8.3)

## 2016-11-12 LAB — FERRITIN: FERRITIN: 113 ng/mL (ref 9–269)

## 2016-11-12 MED ORDER — HYDROCODONE-ACETAMINOPHEN 7.5-325 MG PO TABS: 1.0000 | ORAL_TABLET | Freq: Four times a day (QID) | ORAL | 0 refills | Status: DC | PRN

## 2016-11-12 NOTE — Telephone Encounter (Signed)
Gave patient avs report and appointments for August.  °

## 2016-11-13 ENCOUNTER — Telehealth: Payer: Self-pay | Admitting: *Deleted

## 2016-11-13 NOTE — Telephone Encounter (Signed)
Spoke with pt and informed pt of abdominal xray done 11/12/16.  No obstruction noted;  However, probably related to constipation caused by pain meds.   Reinforced need to take stool softeners daily to help with constipation issue.  Pt voiced understanding. Called daughter Lurline Del and left message of same above info.

## 2016-11-16 ENCOUNTER — Encounter: Payer: Self-pay | Admitting: Hematology

## 2016-11-27 MED FILL — HYDROCODON-APAP 7.5-325: 7.5-325 | 30 days supply | Qty: 120 | Fill #0

## 2016-12-11 ENCOUNTER — Inpatient Hospital Stay (HOSPITAL_COMMUNITY)
Admission: EM | Admit: 2016-12-11 | Discharge: 2016-12-12 | DRG: 065 | Disposition: A | Discharge status: Home Health | Payer: Medicare Other | Attending: Family Medicine | Admitting: Family Medicine

## 2016-12-11 ENCOUNTER — Other Ambulatory Visit: Payer: Self-pay

## 2016-12-11 ENCOUNTER — Emergency Department (HOSPITAL_COMMUNITY): Payer: Medicare Other

## 2016-12-11 DIAGNOSIS — Y92009 Unspecified place in unspecified non-institutional (private) residence as the place of occurrence of the external cause: Secondary | ICD-10-CM

## 2016-12-11 DIAGNOSIS — W19XXXA Unspecified fall, initial encounter: Secondary | ICD-10-CM | POA: Diagnosis present

## 2016-12-11 DIAGNOSIS — R29700 NIHSS score 0: Secondary | ICD-10-CM | POA: Diagnosis present

## 2016-12-11 DIAGNOSIS — Z823 Family history of stroke: Secondary | ICD-10-CM

## 2016-12-11 DIAGNOSIS — I639 Cerebral infarction, unspecified: Secondary | ICD-10-CM | POA: Diagnosis present

## 2016-12-11 DIAGNOSIS — Z9221 Personal history of antineoplastic chemotherapy: Secondary | ICD-10-CM

## 2016-12-11 DIAGNOSIS — Z87891 Personal history of nicotine dependence: Secondary | ICD-10-CM

## 2016-12-11 DIAGNOSIS — I63422 Cerebral infarction due to embolism of left anterior cerebral artery: Secondary | ICD-10-CM | POA: Diagnosis not present

## 2016-12-11 DIAGNOSIS — Z66 Do not resuscitate: Secondary | ICD-10-CM | POA: Diagnosis present

## 2016-12-11 DIAGNOSIS — I10 Essential (primary) hypertension: Secondary | ICD-10-CM | POA: Diagnosis present

## 2016-12-11 DIAGNOSIS — R51 Headache: Secondary | ICD-10-CM | POA: Diagnosis present

## 2016-12-11 DIAGNOSIS — S0083XA Contusion of other part of head, initial encounter: Secondary | ICD-10-CM | POA: Diagnosis present

## 2016-12-11 DIAGNOSIS — R270 Ataxia, unspecified: Secondary | ICD-10-CM | POA: Diagnosis not present

## 2016-12-11 DIAGNOSIS — Z9181 History of falling: Secondary | ICD-10-CM

## 2016-12-11 DIAGNOSIS — Z801 Family history of malignant neoplasm of trachea, bronchus and lung: Secondary | ICD-10-CM

## 2016-12-11 DIAGNOSIS — Z923 Personal history of irradiation: Secondary | ICD-10-CM

## 2016-12-11 DIAGNOSIS — Z7982 Long term (current) use of aspirin: Secondary | ICD-10-CM

## 2016-12-11 DIAGNOSIS — E1165 Type 2 diabetes mellitus with hyperglycemia: Secondary | ICD-10-CM | POA: Diagnosis present

## 2016-12-11 DIAGNOSIS — C251 Malignant neoplasm of body of pancreas: Secondary | ICD-10-CM | POA: Diagnosis present

## 2016-12-11 DIAGNOSIS — Z86718 Personal history of other venous thrombosis and embolism: Secondary | ICD-10-CM

## 2016-12-11 DIAGNOSIS — E119 Type 2 diabetes mellitus without complications: Secondary | ICD-10-CM

## 2016-12-11 LAB — COMPREHENSIVE METABOLIC PANEL
ALBUMIN: 3.9 g/dL (ref 3.5–5.0)
ALT: 13 U/L — AB (ref 14–54)
AST: 17 U/L (ref 15–41)
Alkaline Phosphatase: 68 U/L (ref 38–126)
Anion gap: 8 (ref 5–15)
BUN: 20 mg/dL (ref 6–20)
CHLORIDE: 104 mmol/L (ref 101–111)
CO2: 25 mmol/L (ref 22–32)
CREATININE: 1.01 mg/dL — AB (ref 0.44–1.00)
Calcium: 9.1 mg/dL (ref 8.9–10.3)
GFR calc Af Amer: 59 mL/min — ABNORMAL LOW (ref >60)
GFR calc non Af Amer: 51 mL/min — ABNORMAL LOW (ref >60)
Glucose, Bld: 241 mg/dL — ABNORMAL HIGH (ref 65–99)
Potassium: 3.8 mmol/L (ref 3.5–5.1)
SODIUM: 137 mmol/L (ref 135–145)
Total Bilirubin: 0.2 mg/dL — ABNORMAL LOW (ref 0.3–1.2)
Total Protein: 7.4 g/dL (ref 6.5–8.1)

## 2016-12-11 LAB — CBC
HEMATOCRIT: 34 % — AB (ref 36.0–46.0)
Hemoglobin: 11.3 g/dL — ABNORMAL LOW (ref 12.0–15.0)
MCH: 32.5 pg (ref 26.0–34.0)
MCHC: 33.2 g/dL (ref 30.0–36.0)
MCV: 97.7 fL (ref 78.0–100.0)
PLATELETS: 178 10*3/uL (ref 150–400)
RBC: 3.48 MIL/uL — ABNORMAL LOW (ref 3.87–5.11)
RDW: 13.4 % (ref 11.5–15.5)
WBC: 5.2 10*3/uL (ref 4.0–10.5)

## 2016-12-11 LAB — POCT I-STAT, CHEM 8
BUN: 20 mg/dL (ref 6–20)
CALCIUM ION: 1.15 mmol/L (ref 1.15–1.40)
CREATININE: 0.9 mg/dL (ref 0.44–1.00)
Chloride: 103 mmol/L (ref 101–111)
Glucose, Bld: 249 mg/dL — ABNORMAL HIGH (ref 65–99)
HCT: 34 % — ABNORMAL LOW (ref 36.0–46.0)
Hemoglobin: 11.6 g/dL — ABNORMAL LOW (ref 12.0–15.0)
Potassium: 3.9 mmol/L (ref 3.5–5.1)
Sodium: 137 mmol/L (ref 135–145)
TCO2: 25 mmol/L (ref 0–100)

## 2016-12-11 LAB — APTT: APTT: 28 s (ref 24–36)

## 2016-12-11 LAB — PROTIME-INR
INR: 1.01
PROTHROMBIN TIME: 13.3 s (ref 11.4–15.2)

## 2016-12-11 LAB — ETHANOL

## 2016-12-11 LAB — DIFFERENTIAL
BASOS ABS: 0 10*3/uL (ref 0.0–0.1)
BASOS PCT: 0 %
Eosinophils Absolute: 0.1 10*3/uL (ref 0.0–0.7)
Eosinophils Relative: 1 %
Lymphocytes Relative: 15 %
Lymphs Abs: 0.8 10*3/uL (ref 0.7–4.0)
Monocytes Absolute: 0.5 10*3/uL (ref 0.1–1.0)
Monocytes Relative: 9 %
NEUTROS ABS: 3.9 10*3/uL (ref 1.7–7.7)
Neutrophils Relative %: 75 %

## 2016-12-11 LAB — POCT I-STAT TROPONIN I: Troponin i, poc: 0 ng/mL (ref 0.00–0.08)

## 2016-12-11 LAB — CBG MONITORING, ED: Glucose-Capillary: 196 mg/dL — ABNORMAL HIGH (ref 65–99)

## 2016-12-11 MED ORDER — FENTANYL CITRATE (PF) 100 MCG/2ML IJ SOLN
50.0000 ug | Freq: Once | INTRAMUSCULAR | Status: AC
  Administered 2016-12-11: 50 ug via INTRAVENOUS
  Filled 2016-12-11: qty 2

## 2016-12-11 NOTE — ED Notes (Signed)
Charge nurse at [facility] made aware of impending transfer

## 2016-12-11 NOTE — ED Notes (Signed)
Pt remains in MRI 

## 2016-12-11 NOTE — ED Triage Notes (Signed)
Pt fell this morning, hitting her R side of her face. No LOC. No blood thinners.  Family states pt is not speaking as much as normal. Answering questions appropriately in triage. Alert and oriented.

## 2016-12-11 NOTE — ED Provider Notes (Signed)
[facility] DEPT Provider Note   CSN: [id number] Arrival date & time: 12/11/16  1418     History   Chief Complaint Chief Complaint  Patient presents with  . Fall  . Head Injury    HPI Nicole Sherman is a 83 y.o. female.   Fall  This is a recurrent problem. The current episode started 12 to 24 hours ago. Episode frequency: once today. The problem has been resolved. Pertinent negatives include no chest pain, no abdominal pain, no headaches and no shortness of breath. Nothing aggravates the symptoms. Nothing relieves the symptoms. She has tried nothing for the symptoms.  Head Injury     Pt and family report that she has had gait instability for a long time and tends to stumble and fall from time to time.    Known pancreatic cancer (incurrable) undergoing palliative care.   Also h/o of GI bleed. No recent melena or hematochezia.  Past Medical History:  Diagnosis Date  . Arthritis    osteoarthritis-hands wrist  . Cancer ([place])    pancreatic mass- dx. adenocarcinoma" CT abdomen 05-16-15  . Diabetes mellitus without complication ([place])   . Diverticulosis    showing on CT of abdomen 05-16-15  . Hypertension   . Hypothyroidism     Patient Active Problem List   Diagnosis Date Noted  . Iron deficiency anemia due to chronic blood loss 05/26/2016  . Hypoalbuminemia due to protein-calorie malnutrition ([place]) 11/18/2015  . Lower GI bleeding 11/13/2015  . Hypothyroidism 07/19/2015  . Hypertension 07/19/2015  . Diabetes mellitus without complication ([place]) 42/70/6237  . History of DVT of lower extremity 07/09/2015  . Carcinoma of body of pancreas ([place]) 05/31/2015    Past Surgical History:  Procedure Laterality Date  . ANKLE SURGERY Right   . APPENDECTOMY     open  . CATARACT EXTRACTION Right   . EUS N/A 05/28/2015   Procedure: UPPER ENDOSCOPIC ULTRASOUND (EUS) LINEAR;  Surgeon: Arta Silence, MD;  Location: [facility] ENDOSCOPY;  Service: Endoscopy;  Laterality: N/A;  . EYE  SURGERY     macular hole surgery repair  . JOINT REPLACEMENT Right   . SHOULDER ARTHROSCOPY W/ ROTATOR CUFF REPAIR Left   . SHOULDER OPEN ROTATOR CUFF REPAIR Right   . TUBAL LIGATION      OB History    No data available       Home Medications    Prior to Admission medications   Medication Sig Start Date End Date Taking? Authorizing Provider  docusate sodium (COLACE) 100 MG capsule Take 100 mg by mouth daily as needed for moderate constipation.    Yes [provider]  HYDROcodone-acetaminophen (NORCO) 7.5-325 MG tablet Take 1 tablet by mouth every 6 (six) hours as needed for moderate pain. 11/12/16  Yes [name], MD  ondansetron (ZOFRAN) 8 MG tablet TAKE ONE TABLET BY MOUTH TWICE DAILY AS NEEDED FOR NAUSEA AND VOMITING 12/07/15  Yes [name], MD  prochlorperazine (COMPAZINE) 5 MG tablet Take 1-2 tablets (5-10 mg total) by mouth every 8 (eight) hours as needed for nausea or vomiting. Patient not taking: Reported on 11/12/2016 12/18/15   [name], MD    Family History Family History  Problem Relation Age of Onset  . Stroke Mother   . Cancer Sister        lung cancer     Social History Social History  Substance Use Topics  . Smoking status: Former Smoker    Packs/day: 1.00    Years: 30.00  Types: Cigarettes    Quit date: 05/21/1986  . Smokeless tobacco: Never Used  . Alcohol use No     Allergies   Keflex [cephalexin] and Aspirin   Review of Systems Review of Systems  Respiratory: Negative for shortness of breath.   Cardiovascular: Negative for chest pain.  Gastrointestinal: Negative for abdominal pain.  Neurological: Negative for headaches.   All other systems are reviewed and are negative for acute change except as noted in the HPI   Physical Exam Updated Vital Signs BP 134/77 (BP Location: Left Arm)   Pulse 75   Temp 99 F (37.2 C) (Oral)   Resp 18   SpO2 93%   Physical Exam  Constitutional: She is oriented to person, place, and time. She  appears well-developed and well-nourished. No distress.  HENT:  Head: Normocephalic. Head is with contusion.    Right Ear: External ear normal.  Left Ear: External ear normal.  Nose: Nose normal.  Eyes: Pupils are equal, round, and reactive to light. Conjunctivae and EOM are normal. Right eye exhibits no discharge. Left eye exhibits no discharge. No scleral icterus.  Neck: Normal range of motion. Neck supple.  Cardiovascular: Normal rate, regular rhythm and normal heart sounds.  Exam reveals no gallop and no friction rub.   No murmur heard. Pulses:      Radial pulses are 2+ on the right side, and 2+ on the left side.       Dorsalis pedis pulses are 2+ on the right side, and 2+ on the left side.  Pulmonary/Chest: Effort normal and breath sounds normal. No stridor. No respiratory distress. She has no wheezes.  Abdominal: Soft. She exhibits no distension. There is no tenderness.  Musculoskeletal: She exhibits no edema or tenderness.       Cervical back: She exhibits no bony tenderness.       Thoracic back: She exhibits no bony tenderness.       Lumbar back: She exhibits no bony tenderness.  Clavicles stable. Chest stable to AP/Lat compression. Pelvis stable to Lat compression. No obvious extremity deformity. No chest or abdominal wall contusion.  Neurological: She is alert and oriented to person, place, and time.  Mental Status: Alert and oriented to person, place, and time. Attention and concentration normal. Speech clear.   Cranial Nerves  II Visual Fields: Intact to confrontation. Visual fields intact. III, IV, VI: Pupils equal and reactive to light and near. Full eye movement without nystagmus  V Facial Sensation: Normal. No weakness of masticatory muscles  VII: No facial weakness or asymmetry  VIII Auditory Acuity: Grossly normal  IX/X: The uvula is midline; the palate elevates symmetrically  XI: Normal sternocleidomastoid and trapezius strength  XII: The tongue is midline. No  atrophy or fasciculations.   Motor System: Muscle Strength: 5/5 and symmetric in the upper and lower extremities. No pronation or drift.  Muscle Tone: Tone and muscle bulk are normal in the upper and lower extremities.   Reflexes: DTRs: 1+ and symmetrical in all four extremities. Plantar responses are flexor bilaterally.  Coordination: Intact finger-to-nose, heel-to-shin. No tremor.  Sensation: Intact to light touch, and pinprick. unable to perform Romberg test.  Gait: ataxic.   HINTS: Nystagmus: none Head impulse. normal Skew: abnormal    Skin: Skin is warm and dry. No rash noted. She is not diaphoretic. No erythema.  Psychiatric: She has a normal mood and affect.     ED Treatments / Results  Labs (all labs ordered are listed, but only  abnormal results are displayed) Labs Reviewed  CBC - Abnormal; Notable for the following:       Result Value   RBC 3.48 (*)    Hemoglobin 11.3 (*)    HCT 34.0 (*)    All other components within normal limits  COMPREHENSIVE METABOLIC PANEL - Abnormal; Notable for the following:    Glucose, Bld 241 (*)    Creatinine, Ser 1.01 (*)    ALT 13 (*)    Total Bilirubin 0.2 (*)    GFR calc non Af Amer 51 (*)    GFR calc Af Amer 59 (*)    All other components within normal limits  CBG MONITORING, ED - Abnormal; Notable for the following:    Glucose-Capillary 196 (*)    All other components within normal limits  POCT I-STAT, CHEM 8 - Abnormal; Notable for the following:    Glucose, Bld 249 (*)    Hemoglobin 11.6 (*)    HCT 34.0 (*)    All other components within normal limits  ETHANOL  PROTIME-INR  APTT  DIFFERENTIAL  RAPID URINE DRUG SCREEN, HOSP PERFORMED  URINALYSIS, ROUTINE W REFLEX MICROSCOPIC  I-STAT CHEM 8, ED  I-STAT TROPONIN, ED  POCT I-STAT TROPONIN I    EKG  EKG Interpretation None       Radiology Ct Head Wo Contrast  Result Date: 12/11/2016 CLINICAL DATA:  Patient fell this morning hitting right side of face without  loss consciousness. EXAM: CT HEAD WITHOUT CONTRAST CT MAXILLOFACIAL WITHOUT CONTRAST CT CERVICAL SPINE WITHOUT CONTRAST TECHNIQUE: Multidetector CT imaging of the head, cervical spine, and maxillofacial structures were performed using the standard protocol without intravenous contrast. Multiplanar CT image reconstructions of the cervical spine and maxillofacial structures were also generated. COMPARISON:  09/24/2015 FINDINGS: CT HEAD FINDINGS Brain: Mild-to-moderate superficial atrophy. Chronic small vessel ischemic disease of periventricular and subcortical white matter. No large vascular territory infarct, hemorrhage, midline shift or edema. No intra-axial mass nor extra-axial fluid collections. Vascular: No hyperdense vessel or unexpected calcification. Skull: Negative for fracture or focal lesion. Other: None CT MAXILLOFACIAL FINDINGS Osseous: No fracture or mandibular dislocation. No destructive process. Orbits: Right lens surgical change.  Intact orbits and globes. Sinuses: Minimal mucosal thickening of the left maxillary sinus. Otherwise negative. Soft tissues: No significant soft tissue swelling. CT CERVICAL SPINE FINDINGS Alignment: Intact craniocervical relationship and atlantodental interval. Slight reversal cervical lordosis with apex at C5 secondary to degenerative disc disease. Skull base and vertebrae: No acute fracture. No primary bone lesion or focal pathologic process. Soft tissues and spinal canal: No prevertebral fluid or swelling. No visible canal hematoma. Disc levels: Mild disc space narrowing C2-3 and C4-5, moderate at C6-7 and marked at C5-6. Small posterior marginal osteophytes are noted at C5-6 and C6-7. Bilateral uncovertebral joint osteoarthritis from C3-4 through C6-7 with spurring most pronounced at C5-6. Multilevel degenerative facet arthropathy wit mild-to-moderate left C5-6 neural foraminal encroachment from osteophytes. No jumped or perched facets. Upper chest: Negative. Other:  None IMPRESSION: 1. No acute intracranial abnormality. 2. Chronic stable superficial atrophy with small vessel ischemic disease. 3. No acute maxillofacial fracture or dislocations. 4. Cervical spondylosis without acute fracture or posttraumatic subluxation. Electronically Signed   By: Ashley Royalty M.D.   On: 12/11/2016 18:01   Ct Cervical Spine Wo Contrast  Result Date: 12/11/2016 CLINICAL DATA:  Patient fell this morning hitting right side of face without loss consciousness. EXAM: CT HEAD WITHOUT CONTRAST CT MAXILLOFACIAL WITHOUT CONTRAST CT CERVICAL SPINE WITHOUT CONTRAST TECHNIQUE: Multidetector  CT imaging of the head, cervical spine, and maxillofacial structures were performed using the standard protocol without intravenous contrast. Multiplanar CT image reconstructions of the cervical spine and maxillofacial structures were also generated. COMPARISON:  09/24/2015 FINDINGS: CT HEAD FINDINGS Brain: Mild-to-moderate superficial atrophy. Chronic small vessel ischemic disease of periventricular and subcortical white matter. No large vascular territory infarct, hemorrhage, midline shift or edema. No intra-axial mass nor extra-axial fluid collections. Vascular: No hyperdense vessel or unexpected calcification. Skull: Negative for fracture or focal lesion. Other: None CT MAXILLOFACIAL FINDINGS Osseous: No fracture or mandibular dislocation. No destructive process. Orbits: Right lens surgical change.  Intact orbits and globes. Sinuses: Minimal mucosal thickening of the left maxillary sinus. Otherwise negative. Soft tissues: No significant soft tissue swelling. CT CERVICAL SPINE FINDINGS Alignment: Intact craniocervical relationship and atlantodental interval. Slight reversal cervical lordosis with apex at C5 secondary to degenerative disc disease. Skull base and vertebrae: No acute fracture. No primary bone lesion or focal pathologic process. Soft tissues and spinal canal: No prevertebral fluid or swelling. No visible  canal hematoma. Disc levels: Mild disc space narrowing C2-3 and C4-5, moderate at C6-7 and marked at C5-6. Small posterior marginal osteophytes are noted at C5-6 and C6-7. Bilateral uncovertebral joint osteoarthritis from C3-4 through C6-7 with spurring most pronounced at C5-6. Multilevel degenerative facet arthropathy wit mild-to-moderate left C5-6 neural foraminal encroachment from osteophytes. No jumped or perched facets. Upper chest: Negative. Other: None IMPRESSION: 1. No acute intracranial abnormality. 2. Chronic stable superficial atrophy with small vessel ischemic disease. 3. No acute maxillofacial fracture or dislocations. 4. Cervical spondylosis without acute fracture or posttraumatic subluxation. Electronically Signed   By: Ashley Royalty M.D.   On: 12/11/2016 18:01   Ct Maxillofacial Wo Cm  Result Date: 12/11/2016 CLINICAL DATA:  Patient fell this morning hitting right side of face without loss consciousness. EXAM: CT HEAD WITHOUT CONTRAST CT MAXILLOFACIAL WITHOUT CONTRAST CT CERVICAL SPINE WITHOUT CONTRAST TECHNIQUE: Multidetector CT imaging of the head, cervical spine, and maxillofacial structures were performed using the standard protocol without intravenous contrast. Multiplanar CT image reconstructions of the cervical spine and maxillofacial structures were also generated. COMPARISON:  09/24/2015 FINDINGS: CT HEAD FINDINGS Brain: Mild-to-moderate superficial atrophy. Chronic small vessel ischemic disease of periventricular and subcortical white matter. No large vascular territory infarct, hemorrhage, midline shift or edema. No intra-axial mass nor extra-axial fluid collections. Vascular: No hyperdense vessel or unexpected calcification. Skull: Negative for fracture or focal lesion. Other: None CT MAXILLOFACIAL FINDINGS Osseous: No fracture or mandibular dislocation. No destructive process. Orbits: Right lens surgical change.  Intact orbits and globes. Sinuses: Minimal mucosal thickening of the left  maxillary sinus. Otherwise negative. Soft tissues: No significant soft tissue swelling. CT CERVICAL SPINE FINDINGS Alignment: Intact craniocervical relationship and atlantodental interval. Slight reversal cervical lordosis with apex at C5 secondary to degenerative disc disease. Skull base and vertebrae: No acute fracture. No primary bone lesion or focal pathologic process. Soft tissues and spinal canal: No prevertebral fluid or swelling. No visible canal hematoma. Disc levels: Mild disc space narrowing C2-3 and C4-5, moderate at C6-7 and marked at C5-6. Small posterior marginal osteophytes are noted at C5-6 and C6-7. Bilateral uncovertebral joint osteoarthritis from C3-4 through C6-7 with spurring most pronounced at C5-6. Multilevel degenerative facet arthropathy wit mild-to-moderate left C5-6 neural foraminal encroachment from osteophytes. No jumped or perched facets. Upper chest: Negative. Other: None IMPRESSION: 1. No acute intracranial abnormality. 2. Chronic stable superficial atrophy with small vessel ischemic disease. 3. No acute maxillofacial fracture or dislocations.  4. Cervical spondylosis without acute fracture or posttraumatic subluxation. Electronically Signed   By: Ashley Royalty M.D.   On: 12/11/2016 18:01    Procedures Procedures (including critical care time)  Medications Ordered in ED Medications  fentaNYL (SUBLIMAZE) injection 50 mcg (50 mcg Intravenous Given 12/11/16 2053)     Initial Impression / Assessment and Plan / ED Course  I have reviewed the triage vital signs and the nursing notes.  Pertinent labs & imaging results that were available during my care of the patient were reviewed by me and considered in my medical decision making (see chart for details).      No evidence of severe external trauma. Exam nonfocal but HINTS concerning for central process of ataxia. CTs negative. Would like to obtain MRI to rule out CVA.  Will need to send to [facility] for MRI. Discussed with Dr.  Darl Householder.    Final Clinical Impressions(s) / ED Diagnoses   Final diagnoses:  Ataxia     Jocilynn Grade, Grayce Sessions, MD 12/12/16 [phone]

## 2016-12-11 NOTE — ED Provider Notes (Addendum)
Patient lost her balance and fell today striking her head. She is presently asymptomatic. Denies any headache. Sent here for MRI of her brain as MRI not available at [facility]. There was no syncope. She felt well prior to the fall. She and family report that she has unsteady gait at baseline. On exam alert Glasgow Coma Score 15 HEENT exam there is a 3 cm ecchymosis at right temporal area otherwise normocephalic atraumatic neck nontender trachea midline lungs clear auscultation heart regular rate and rhythm neurologic moves all extremities cranial nerves II through XII grossly intact grossly no abnormal coordination   Orlie Dakin, MD 19/41/74 0814 Dr Roxanne Mins to arrange for asdmission   Orlie Dakin, MD 12/12/16 [phone]

## 2016-12-11 NOTE — ED Notes (Signed)
Patient transported to MRI 

## 2016-12-12 ENCOUNTER — Inpatient Hospital Stay (HOSPITAL_COMMUNITY): Payer: Medicare Other

## 2016-12-12 ENCOUNTER — Other Ambulatory Visit: Payer: Self-pay

## 2016-12-12 DIAGNOSIS — R270 Ataxia, unspecified: Secondary | ICD-10-CM | POA: Diagnosis not present

## 2016-12-12 DIAGNOSIS — I639 Cerebral infarction, unspecified: Secondary | ICD-10-CM

## 2016-12-12 DIAGNOSIS — Z9221 Personal history of antineoplastic chemotherapy: Secondary | ICD-10-CM | POA: Diagnosis not present

## 2016-12-12 DIAGNOSIS — Z86718 Personal history of other venous thrombosis and embolism: Secondary | ICD-10-CM | POA: Diagnosis not present

## 2016-12-12 DIAGNOSIS — S0083XA Contusion of other part of head, initial encounter: Secondary | ICD-10-CM | POA: Diagnosis present

## 2016-12-12 DIAGNOSIS — Z66 Do not resuscitate: Secondary | ICD-10-CM | POA: Diagnosis present

## 2016-12-12 DIAGNOSIS — R29700 NIHSS score 0: Secondary | ICD-10-CM | POA: Diagnosis present

## 2016-12-12 DIAGNOSIS — Z7982 Long term (current) use of aspirin: Secondary | ICD-10-CM | POA: Diagnosis not present

## 2016-12-12 DIAGNOSIS — Z823 Family history of stroke: Secondary | ICD-10-CM | POA: Diagnosis not present

## 2016-12-12 DIAGNOSIS — E119 Type 2 diabetes mellitus without complications: Secondary | ICD-10-CM

## 2016-12-12 DIAGNOSIS — C251 Malignant neoplasm of body of pancreas: Secondary | ICD-10-CM

## 2016-12-12 DIAGNOSIS — Z9181 History of falling: Secondary | ICD-10-CM | POA: Diagnosis not present

## 2016-12-12 DIAGNOSIS — Z801 Family history of malignant neoplasm of trachea, bronchus and lung: Secondary | ICD-10-CM | POA: Diagnosis not present

## 2016-12-12 DIAGNOSIS — W19XXXA Unspecified fall, initial encounter: Secondary | ICD-10-CM | POA: Diagnosis present

## 2016-12-12 DIAGNOSIS — I361 Nonrheumatic tricuspid (valve) insufficiency: Secondary | ICD-10-CM | POA: Diagnosis not present

## 2016-12-12 DIAGNOSIS — Z923 Personal history of irradiation: Secondary | ICD-10-CM | POA: Diagnosis not present

## 2016-12-12 DIAGNOSIS — Z87891 Personal history of nicotine dependence: Secondary | ICD-10-CM | POA: Diagnosis not present

## 2016-12-12 DIAGNOSIS — R51 Headache: Secondary | ICD-10-CM | POA: Diagnosis present

## 2016-12-12 DIAGNOSIS — I63422 Cerebral infarction due to embolism of left anterior cerebral artery: Principal | ICD-10-CM

## 2016-12-12 DIAGNOSIS — I10 Essential (primary) hypertension: Secondary | ICD-10-CM | POA: Diagnosis not present

## 2016-12-12 DIAGNOSIS — E1165 Type 2 diabetes mellitus with hyperglycemia: Secondary | ICD-10-CM | POA: Diagnosis present

## 2016-12-12 DIAGNOSIS — Y92009 Unspecified place in unspecified non-institutional (private) residence as the place of occurrence of the external cause: Secondary | ICD-10-CM | POA: Diagnosis not present

## 2016-12-12 LAB — URINALYSIS, ROUTINE W REFLEX MICROSCOPIC
BILIRUBIN URINE: NEGATIVE
HGB URINE DIPSTICK: NEGATIVE
KETONES UR: 5 mg/dL — AB
NITRITE: NEGATIVE
PH: 6 (ref 5.0–8.0)
Protein, ur: 30 mg/dL — AB
SPECIFIC GRAVITY, URINE: 1.017 (ref 1.005–1.030)

## 2016-12-12 LAB — I-STAT CHEM 8, ED
BUN: 19 mg/dL (ref 6–20)
CHLORIDE: 102 mmol/L (ref 101–111)
Calcium, Ion: 1.21 mmol/L (ref 1.15–1.40)
Creatinine, Ser: 0.9 mg/dL (ref 0.44–1.00)
Glucose, Bld: 215 mg/dL — ABNORMAL HIGH (ref 65–99)
HEMATOCRIT: 33 % — AB (ref 36.0–46.0)
Hemoglobin: 11.2 g/dL — ABNORMAL LOW (ref 12.0–15.0)
POTASSIUM: 4 mmol/L (ref 3.5–5.1)
SODIUM: 138 mmol/L (ref 135–145)
TCO2: 27 mmol/L (ref 0–100)

## 2016-12-12 LAB — VAS US CAROTID
LCCAPSYS: -90 cm/s
LEFT ECA DIAS: -11 cm/s
LEFT VERTEBRAL DIAS: -17 cm/s
LICADDIAS: -37 cm/s
LICAPDIAS: -16 cm/s
LICAPSYS: -54 cm/s
Left CCA dist dias: 18 cm/s
Left CCA dist sys: 60 cm/s
Left CCA prox dias: -19 cm/s
Left ICA dist sys: -134 cm/s
RIGHT ECA DIAS: -11 cm/s
RIGHT VERTEBRAL DIAS: -15 cm/s
Right CCA prox dias: 17 cm/s
Right CCA prox sys: 73 cm/s
Right cca dist sys: -69 cm/s

## 2016-12-12 LAB — GLUCOSE, CAPILLARY
GLUCOSE-CAPILLARY: 217 mg/dL — AB (ref 65–99)
GLUCOSE-CAPILLARY: 171 mg/dL — AB (ref 65–99)
Glucose-Capillary: 140 mg/dL — ABNORMAL HIGH (ref 65–99)

## 2016-12-12 LAB — APTT: APTT: 31 s (ref 24–36)

## 2016-12-12 LAB — CBG MONITORING, ED: Glucose-Capillary: 205 mg/dL — ABNORMAL HIGH (ref 65–99)

## 2016-12-12 LAB — DIFFERENTIAL
Basophils Absolute: 0 10*3/uL (ref 0.0–0.1)
Basophils Relative: 0 %
EOS ABS: 0 10*3/uL (ref 0.0–0.7)
EOS PCT: 1 %
LYMPHS ABS: 0.8 10*3/uL (ref 0.7–4.0)
Lymphocytes Relative: 18 %
MONO ABS: 0.3 10*3/uL (ref 0.1–1.0)
Monocytes Relative: 7 %
NEUTROS PCT: 74 %
Neutro Abs: 3.4 10*3/uL (ref 1.7–7.7)

## 2016-12-12 LAB — RAPID URINE DRUG SCREEN, HOSP PERFORMED
Amphetamines: NOT DETECTED
BARBITURATES: NOT DETECTED
Benzodiazepines: NOT DETECTED
COCAINE: NOT DETECTED
Opiates: POSITIVE — AB
TETRAHYDROCANNABINOL: NOT DETECTED

## 2016-12-12 LAB — I-STAT TROPONIN, ED: Troponin i, poc: 0 ng/mL (ref 0.00–0.08)

## 2016-12-12 LAB — LIPID PANEL
CHOLESTEROL: 235 mg/dL — AB (ref 0–200)
HDL: 46 mg/dL (ref >40)
LDL Cholesterol: 132 mg/dL — ABNORMAL HIGH (ref 0–99)
TRIGLYCERIDES: 286 mg/dL — AB (ref <150)
Total CHOL/HDL Ratio: 5.1 RATIO
VLDL: 57 mg/dL — AB (ref 0–40)

## 2016-12-12 LAB — ECHOCARDIOGRAM COMPLETE

## 2016-12-12 LAB — PROTIME-INR
INR: 1.06
PROTHROMBIN TIME: 13.8 s (ref 11.4–15.2)

## 2016-12-12 LAB — HEMOGLOBIN A1C
Hgb A1c MFr Bld: 9.9 % — ABNORMAL HIGH (ref 4.8–5.6)
Mean Plasma Glucose: 237.43 mg/dL

## 2016-12-12 MED ORDER — DOCUSATE SODIUM 100 MG PO CAPS: 100.0000 mg | ORAL_CAPSULE | Freq: Every day | ORAL | Status: DC | PRN

## 2016-12-12 MED ORDER — INSULIN ASPART 100 UNIT/ML ~~LOC~~ SOLN
0.0000 [IU] | SUBCUTANEOUS | Status: DC
  Filled 2016-12-12: qty 1

## 2016-12-12 MED ORDER — ACETAMINOPHEN 650 MG RE SUPP: 650.0000 mg | RECTAL | Status: DC | PRN

## 2016-12-12 MED ORDER — INSULIN ASPART 100 UNIT/ML ~~LOC~~ SOLN
0.0000 [IU] | Freq: Three times a day (TID) | SUBCUTANEOUS | Status: DC
  Administered 2016-12-12: 2 [IU] via SUBCUTANEOUS
  Administered 2016-12-12: 1 [IU] via SUBCUTANEOUS
  Administered 2016-12-12: 3 [IU] via SUBCUTANEOUS

## 2016-12-12 MED ORDER — ATORVASTATIN CALCIUM 40 MG PO TABS
40.0000 mg | ORAL_TABLET | Freq: Every day | ORAL | Status: DC
  Administered 2016-12-12: 40 mg via ORAL
  Filled 2016-12-12: qty 1

## 2016-12-12 MED ORDER — ASPIRIN 81 MG PO CHEW: 81.0000 mg | CHEWABLE_TABLET | Freq: Every day | ORAL | 0 refills | Status: DC

## 2016-12-12 MED ORDER — STROKE: EARLY STAGES OF RECOVERY BOOK
Freq: Once | Status: AC
  Administered 2016-12-12: 1
  Filled 2016-12-12: qty 1

## 2016-12-12 MED ORDER — ASPIRIN 81 MG PO CHEW
81.0000 mg | CHEWABLE_TABLET | Freq: Every day | ORAL | Status: DC
  Administered 2016-12-12: 81 mg via ORAL
  Filled 2016-12-12: qty 1

## 2016-12-12 MED ORDER — HYDROCODONE-ACETAMINOPHEN 7.5-325 MG PO TABS
1.0000 | ORAL_TABLET | Freq: Four times a day (QID) | ORAL | Status: DC | PRN
  Administered 2016-12-12 (×2): 1 via ORAL
  Filled 2016-12-12 (×2): qty 1

## 2016-12-12 MED ORDER — ACETAMINOPHEN 325 MG PO TABS
650.0000 mg | ORAL_TABLET | ORAL | Status: DC | PRN
  Administered 2016-12-12: 650 mg via ORAL
  Filled 2016-12-12: qty 2

## 2016-12-12 MED ORDER — ACETAMINOPHEN 160 MG/5ML PO SOLN: 650.0000 mg | ORAL | Status: DC | PRN

## 2016-12-12 MED ORDER — ATORVASTATIN CALCIUM 40 MG PO TABS: 40.0000 mg | ORAL_TABLET | Freq: Every day | ORAL | 0 refills | Status: DC

## 2016-12-12 MED FILL — Ondansetron HCl Inj 4 MG/2ML (2 MG/ML): INTRAMUSCULAR | Qty: 2 | Status: AC

## 2016-12-12 NOTE — Progress Notes (Signed)
STROKE TEAM PROGRESS NOTE   HISTORY OF PRESENT ILLNESS (per record) Nicole Sherman is a 83 y.o. female past medical history of pancreatic adenocarcinoma status post radiation and chemotherapy currently not on any treatment, diabetes, hypothyroidism, hypertension who has not been compliant with her medications for the past 1-1/2 years ever since the diagnosis of her cancer was brought into the hospital after she sustained a unwitnessed fall.  At around 8:30 AM, the patient's husband heard a thud and went to check and saw the patient down on the ground. She had a small bruise on the right forehead. She did not feel right. She was brought into [facility] emergency room, where on initial evaluation there was a concern for posterior circulation stroke and the patient was transferred to [facility] for an MRI.  An MRI done at [facility] revealed acute left ACA embolic looking strokes with petechial hemorrhage.  Neurology was consulted because of the findings of the stroke.   Patient has been following with oncology at [facility]. She is not a candidate for her pancreatic mass resection. She has in the past received radiation and chemotherapy that had shrunk the size of the tumor per family. She also has a history of a DVT in her leg for which she was on Coumadin at some point, currently not on Coumadin.  The daughter says that ever since she had the diagnosis of the pancreatic cancer, she has taken herself off of all medications including medications for diabetes, hypertension and hypothyroidism. At baseline, she is able to take care of herself, cook, go to the grocery store and requires no assistance walking. According to her daughter, she has good and bad days in terms of being very tired and recently she has had more bad days than good.  LKW: night of 12/10/2016  Premorbid modified Rankin scale (mRS): 1-No significant post stroke disability and can perform usual duties with stroke symptoms  Patient was  not administered IV t-PA secondary to arriving outside of the treatment window. She was admitted to General Neurology for further evaluation and treatment.   SUBJECTIVE (INTERVAL HISTORY) Her family is at the bedside.  The patient is awake, alert, and follows all commands appropriately.     OBJECTIVE Temp:  [98.2 F (36.8 C)-99 F (37.2 C)] 98.2 F (36.8 C) (08/10 0545) Pulse Rate:  [52-75] 52 (08/10 1200) Cardiac Rhythm: Sinus bradycardia (08/10 0700) Resp:  [15-23] 16 (08/10 1200) BP: (105-161)/(49-88) 139/64 (08/10 1200) SpO2:  [89 %-97 %] 97 % (08/10 1200)  CBC:  Recent Labs Lab 12/11/16 1923 12/11/16 1959 12/12/16 0024 12/12/16 0048  WBC 5.2  --   --   --   NEUTROABS 3.9  --  3.4  --   HGB 11.3* 11.6*  --  11.2*  HCT 34.0* 34.0*  --  33.0*  MCV 97.7  --   --   --   PLT 178  --   --   --     Basic Metabolic Panel:  Recent Labs Lab 12/11/16 1923 12/11/16 1959 12/12/16 0048  NA 137 137 138  K 3.8 3.9 4.0  CL 104 103 102  CO2 25  --   --   GLUCOSE 241* 249* 215*  BUN 20 20 19   CREATININE 1.01* 0.90 0.90  CALCIUM 9.1  --   --     Lipid Panel:    Component Value Date/Time   CHOL 235 (H) 12/12/2016 0434   TRIG 286 (H) 12/12/2016 0434  HDL 46 12/12/2016 0434   CHOLHDL 5.1 12/12/2016 0434   VLDL 57 (H) 12/12/2016 0434   LDLCALC 132 (H) 12/12/2016 0434   HgbA1c:  Lab Results  Component Value Date   HGBA1C 9.9 (H) 12/12/2016   Urine Drug Screen:    Component Value Date/Time   LABOPIA POSITIVE (A) 12/12/2016 0155   COCAINSCRNUR NONE DETECTED 12/12/2016 0155   LABBENZ NONE DETECTED 12/12/2016 0155   AMPHETMU NONE DETECTED 12/12/2016 0155   THCU NONE DETECTED 12/12/2016 0155   LABBARB NONE DETECTED 12/12/2016 0155    Alcohol Level     Component Value Date/Time   ETH <5 12/11/2016 1923    IMAGING  Ct Head Wo Contrast IMPRESSION: 1. No acute intracranial abnormality.  Ct Cervical Spine Wo Contrast 12/11/2016 IMPRESSION: Cervical spondylosis  without acute fracture or posttraumatic subluxation.  Ct Maxillofacial Wo Cm 12/11/2016 IMPRESSION: No acute maxillofacial fracture or dislocations.  Mr Brain Wo Contrast 12/12/2016 IMPRESSION: 1. Acute small to moderate LEFT ACA territory infarct with petechial hemorrhage. 2. Mild chronic small vessel ischemic disease. Scattered old lacunar infarcts.   Mr Jodene Nam Head Wo Contrast 12/12/2016 IMPRESSION: 1. No emergent large vessel occlusion or stenosis. 2. 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   Carotid US 12/12/2016 B ICA 1-39% stenosis, VAs antegrade  TTE 12/12/2016  pending   PHYSICAL EXAM Pleasant elderly caucasian lady not in distress. . Afebrile. Head is nontraumatic. Neck is supple without bruit.    Cardiac exam no murmur or gallop. Lungs are clear to auscultation. Distal pulses are well felt.  Neurological Exam ;  Awake  Alert oriented x 2. Normal but slightly reduced spontaneous speech . No aphasia or dysarthria. Diminished attention, registration and recall. Follows one and two-step commands..eye movements full without nystagmus.fundi were not visualized. Vision acuity and fields appear normal. Hearing is normal. Palatal movements are normal. Face symmetric. Tongue midline. Normal strength, tone, reflexes and coordination. Normal sensation. Gait deferred.  ASSESSMENT/PLAN Ms. Nicole Sherman is a 83 y.o. female with history of  pancreatic adenocarcinoma status post radiation and chemotherapy currently not on any treatment, diabetes, hypothyroidism, hypertension who has not been compliant with her medications for the past 1-1/2 years presenting with unwitnessed fall. She did not receive IV t-PA due to arriving outside of the treatment window.   Stroke: Acute small to moderate LEFT ACA territory infarct with petechial hemorrhage, likely embolic, etiology undetermined, in setting of multiple risk factors including hypercoagulable state.  Resultant  no deficits  CT head: no stroke  MRI  head:  Acute small to moderate LEFT ACA territory infarct with petechial hemorrhage.  MRA head: not ordered  Carotid Doppler: B ICA 1-39% stenosis, VAs antegrade  2D Echo  pending   LDL 132  HgbA1c 9.9  SCDs for VTE prophylaxis  Diet Carb Modified Fluid consistency: Thin; Room service appropriate? Yes  No antithrombotic prior to admission, now on aspirin 81 mg daily  Patient counseled to be compliant with her antithrombotic medications  Ongoing aggressive stroke risk factor management  Therapy recommendations:   pending  Disposition:   pending  Hypertension  Stable  Permissive hypertension (OK if < 220/120) but gradually normalize in 5-7 days  Long-term BP goal normotensive  Hyperlipidemia  Home meds:  none  LDL 132, goal < 70  Add atorvastatin 40mg  PO daily  Continue statin at discharge  Diabetes  HgbA1c 9.9, goal < 7.0  Uncontrolled  Other Stroke Risk Factors  Advanced age  Hx stroke/TIA  Family hx stroke (mother)  Pancreatic Cancer/hypercoagulable state  Other Active Problems  none  Hospital day # 0  I have personally examined this patient, reviewed notes, independently viewed imaging studies, participated in medical decision making and plan of care.ROS completed by me personally and pertinent positives fully documented  I have made any additions or clarifications directly to the above note. Agree with note above. She presented with a fall secondary to left anterior cerebral artery embolic infarct etiology to be determined but likely related to her pancreatic cancer. I had a long discussion with the patient, husband and her daughters and they are reasonable and do not want to do aggressive workup like he loop recorder. Patient has history of GI bleeding on warfarin in the past and is not a good long-term anticoagulation candidate. Recommend coated aspirin and check carotid ultrasound, transthoracic echo and continue telemetric cardiac monitoring for  A. fib. Discussed with Dr. Lonny Prude. Greater than 50% time during this 35 minute visit was spent on counseling and coordination of care about her embolic stroke, pancreatic cancer, evaluation and treatment discussion  Antony Contras, MD Medical Director [place] Pager: [phone] 12/12/2016 1:07 PM   To contact Stroke Continuity provider, please refer to [email or web address] After hours, contact General Neurology

## 2016-12-12 NOTE — ED Notes (Signed)
Pt returned from MRI °

## 2016-12-12 NOTE — Care Management Note (Signed)
Case Management Note  Patient Details  Name: Nicole Sherman MRN: [id number] Date of Birth: [birth year]  Subjective/Objective:                    Action/Plan: Pt discharging home with orders for [facility] services. CM met with the patient and her husband and provided a list of [place] agencies. They selected [facility]. Orders faxed to [facility].  Daughter to provide transportation home.   Expected Discharge Date:  12/12/16               Expected Discharge Plan:  [place]  In-House Referral:     Discharge planning Services  CM Consult  Post Acute Care Choice:  Home Health Choice offered to:  Patient, Spouse  DME Arranged:    DME Agency:     HH Arranged:  PT, OT HH Agency:  [place]  Status of Service:  Completed, signed off  If discussed at [place] of Stay Meetings, dates discussed:    Additional Comments:  Pollie Friar, RN 12/12/2016, 5:11 PM

## 2016-12-12 NOTE — Consult Note (Addendum)
Neurology Consultation  Reason for Consult: Stroke Referring Physician: Dr. Roxanne Mins  CC: Fall  History is obtained from: Daughter  HPI: Nicole Sherman is a 83 y.o. female past medical history of pancreatic adenocarcinoma status post radiation and chemotherapy currently not on any treatment, diabetes, hypothyroidism, hypertension who has not been compliant with her medications for the past 1-1/2 years ever since the diagnosis of her cancer was brought into the hospital after she sustained a unwitnessed fall this morning. At around 8:30 AM, the patient's husband heard a thud and went to check and saw the patient down on the ground. She had a small bruise on the right forehead. She did not feel right. She was brought into [facility] emergency room, where on initial evaluation there was a concern for posterior circulation stroke and the patient was transferred to [facility] for an MRI.  An MRI done at [facility] revealed acute left ACA embolic looking strokes with petechial hemorrhage. Neurology was consulted because of the findings of the stroke.   Patient has been following with oncology at [facility]. She is not a candidate for her pancreatic mass resection. She has in the past received radiation and chemotherapy that had shrunk the size of the tumor per family. She also has a history of a DVT in her leg for which she was on Coumadin at some point, currently not on Coumadin. The daughter says that ever since she had the diagnosis of the pancreatic cancer, she has taken herself off of all medications including medications for diabetes, hypertension and hypothyroidism. At baseline, she is able to take care of herself, cook, go to the grocery store and requires no assistance walking. According to her daughter, she has good and bad days in terms of being very tired and recently she has had more bad days than good.   LKW: night of 12/10/2016  tpa given?: no, Outside the time window Premorbid  modified Rankin scale (mRS): 1-No significant post stroke disability and can perform usual duties with stroke symptoms  ROS: A 14 point ROS was performed and is negative except as noted in the HPI.    Past Medical History:  Diagnosis Date  . Arthritis    osteoarthritis-hands wrist  . Cancer ([place])    pancreatic mass- dx. adenocarcinoma" CT abdomen 05-16-15  . Diabetes mellitus without complication ([place])   . Diverticulosis    showing on CT of abdomen 05-16-15  . Hypertension   . Hypothyroidism      Family History  Problem Relation Age of Onset  . Stroke Mother   . Cancer Sister        lung cancer     Social History:   reports that she quit smoking about 30 years ago. Her smoking use included Cigarettes. She has a 30.00 pack-year smoking history. She has never used smokeless tobacco. She reports that she does not drink alcohol or use drugs.  Medications No current facility-administered medications for this encounter.   Current Outpatient Prescriptions:  .  docusate sodium (COLACE) 100 MG capsule, Take 100 mg by mouth daily as needed for moderate constipation. , Disp: , Rfl:  .  HYDROcodone-acetaminophen (NORCO) 7.5-325 MG tablet, Take 1 tablet by mouth every 6 (six) hours as needed for moderate pain., Disp: 120 tablet, Rfl: 0 .  ondansetron (ZOFRAN) 8 MG tablet, TAKE ONE TABLET BY MOUTH TWICE DAILY AS NEEDED FOR NAUSEA AND VOMITING, Disp: 30 tablet, Rfl: 0 .  prochlorperazine (COMPAZINE) 5 MG tablet, Take  1-2 tablets (5-10 mg total) by mouth every 8 (eight) hours as needed for nausea or vomiting. (Patient not taking: Reported on 11/12/2016), Disp: 30 tablet, Rfl: 2  Exam: Current vital signs: BP (!) 146/66   Pulse 61   Temp 98.7 F (37.1 C)   Resp 18   SpO2 96%  Vital signs in last 24 hours: Temp:  [98.7 F (37.1 C)-99 F (37.2 C)] 98.7 F (37.1 C) (08/09 2040) Pulse Rate:  [57-75] 61 (08/10 0027) Resp:  [16-23] 18 (08/09 2315) BP: (131-158)/(66-88) 146/66 (08/10  0027) SpO2:  [89 %-96 %] 96 % (08/10 0027)  GENERAL: comfortably sleeping in bed, easily arousable to voice, alert in NAD HEENT: - Normocephalic and right temporal area bruise, dry mm, no LN++, no Thyromegally LUNGS - Clear to auscultation bilaterally with no wheezes CV - S1S2 RRR, no m/r/g, equal pulses bilaterally. ABDOMEN - Soft, nontender, nondistended with normoactive BS Ext: warm, well perfused, intact peripheral pulses1+ edema  NEURO:  Mental Status: AA&Ox3  Language: speech is clear.  naming, repetition, fluency, and comprehension intact. Cranial Nerves: PERRL. EOMI, visual fields full, no facial asymmetry,facial sensation intact, hearing intact, tongue/uvula/soft palate midline, normal sternocleidomastoid and trapezius muscle strength. No evidence of tongue atrophy or fibrillations Motor: 4+/5 right leg, 5/5 all other extremities. No vertical drift in any extremity.  Tone: is normal and bulk is normal Sensation- Intact to light touch bilaterally Coordination: FTN intact bilaterally, no ataxia in BLE. Gait- deferred  NIHSS - 0  Labs I have reviewed labs in epic and the results pertinent to this consultation are: CBC significant for anemia, CMP significant for hyperglycemia and possible acute kidney injury.  CBC    Component Value Date/Time   WBC 5.2 12/11/2016 1923   RBC 3.48 (L) 12/11/2016 1923   HGB 11.2 (L) 12/12/2016 0048   HGB 10.9 (L) 11/12/2016 1321   HCT 33.0 (L) 12/12/2016 0048   HCT 32.7 (L) 11/12/2016 1321   PLT 178 12/11/2016 1923   PLT 180 11/12/2016 1321   MCV 97.7 12/11/2016 1923   MCV 99.9 11/12/2016 1321   MCH 32.5 12/11/2016 1923   MCHC 33.2 12/11/2016 1923   RDW 13.4 12/11/2016 1923   RDW 13.8 11/12/2016 1321   LYMPHSABS 0.8 12/12/2016 0024   LYMPHSABS 0.8 (L) 11/12/2016 1321   MONOABS 0.3 12/12/2016 0024   MONOABS 0.5 11/12/2016 1321   EOSABS 0.0 12/12/2016 0024   EOSABS 0.1 11/12/2016 1321   BASOSABS 0.0 12/12/2016 0024   BASOSABS 0.0  11/12/2016 1321    CMP     Component Value Date/Time   NA 138 12/12/2016 0048   NA 139 11/12/2016 1321   K 4.0 12/12/2016 0048   K 4.4 11/12/2016 1321   CL 102 12/12/2016 0048   CO2 25 12/11/2016 1923   CO2 26 11/12/2016 1321   GLUCOSE 215 (H) 12/12/2016 0048   GLUCOSE 326 (H) 11/12/2016 1321   BUN 19 12/12/2016 0048   BUN 20.1 11/12/2016 1321   CREATININE 0.90 12/12/2016 0048   CREATININE 1.4 (H) 11/12/2016 1321   CALCIUM 9.1 12/11/2016 1923   CALCIUM 9.8 11/12/2016 1321   PROT 7.4 12/11/2016 1923   PROT 7.1 11/12/2016 1321   ALBUMIN 3.9 12/11/2016 1923   ALBUMIN 3.9 11/12/2016 1321   AST 17 12/11/2016 1923   AST 15 11/12/2016 1321   ALT 13 (L) 12/11/2016 1923   ALT 12 11/12/2016 1321   ALKPHOS 68 12/11/2016 1923   ALKPHOS 74 11/12/2016 1321   BILITOT  0.2 (L) 12/11/2016 1923   BILITOT 0.39 11/12/2016 1321   GFRNONAA 51 (L) 12/11/2016 1923   GFRAA 39 (L) 12/11/2016 1923    Imaging I have reviewed the images obtained:  CT-scan of the brain - no acute abnormality  MRI examination of the brain - left ACA embolic strokes with some hemorrhagic transformation.   Assessment:  83 year old woman with a past medical history of pancreatic cancer, hypertension, hypothyroidism, diabetes, noncompliant with all her diabetic and hypertensive medications, brought in for evaluation after she had sustained an unwitnessed fall. Initial ER evaluation was concerning for a posterior circulation stroke because of which she got a MRI of the brain. The MRI did not reveal any posterior circulation abnormality but showed left ACA territory embolic looking strokes.  She will need admission for further workup. Most likely etiology of her stroke is hypercoagulable state secondary to her malignancy. Other causes of stroke need to be evaluated.   Impression: Left ACA stroke with some hemorrhagic transformation Pancreatic cancer Hypertension Diabetes Hypothyroidism   Recommendations: -Check  hemoglobin A1c, fasting lipid panel. -Allow for permissive hypertension, treating only when necessary if blood pressures are over 726 systolic -MRA of the brain without contrast -Carotid Dopplers -Telemetry -Frequent neuro checks -Echo cardio gram -There is minimal amount of hemorrhagic transformation, but I would recommend that she be on low-dose aspirin. -PT/OT/speech therapy evaluation.  -Further recommendations on whether the patient will require anticoagulation for life with Lovenox will depend on the medical course and test results. Stroke neurology team will follow with you in the morning.  --- Amie Portland, MD [facility] [phone]  If 7pm to 7am, please call on call as listed on AMION.

## 2016-12-12 NOTE — H&P (Signed)
History and Physical    Nicole Sherman:[id number] DOB: Dec 17, 1933 DOA: 12/11/2016  PCP: Hulan Fess, MD  Patient coming from: Home  I have personally briefly reviewed patient's old medical records in [place]  Chief Complaint: Fall  HPI: Nicole Sherman is a 83 y.o. female with medical history significant of pancreatic adenocarcinoma, s/p radiation and chemotherapy but inoperable.  Currently not on any treatment.  Also h/o DM2, HTN.  Took herself off of all meds after diagnosis of pancreatic cancer back in Jan 2017.  At 8:30 AM, patients husband heard a thud, found patient down on ground.  Bruise to R forehead and didn't feal right.  Went first to [facility], then [facility].   ED Course: MRI at [facility] confirms acute small to moderate Left ACA stroke with petechial hemorrhage.  Biggest complaint at this point is headache patient states.   Review of Systems: As per HPI otherwise 10 point review of systems negative.   Past Medical History:  Diagnosis Date  . Arthritis    osteoarthritis-hands wrist  . Cancer ([place])    pancreatic mass- dx. adenocarcinoma" CT abdomen 05-16-15  . Diabetes mellitus without complication ([place])   . Diverticulosis    showing on CT of abdomen 05-16-15  . Hypertension   . Hypothyroidism     Past Surgical History:  Procedure Laterality Date  . ANKLE SURGERY Right   . APPENDECTOMY     open  . CATARACT EXTRACTION Right   . EUS N/A 05/28/2015   Procedure: UPPER ENDOSCOPIC ULTRASOUND (EUS) LINEAR;  Surgeon: Arta Silence, MD;  Location: [facility];  Service: Endoscopy;  Laterality: N/A;  . EYE SURGERY     macular hole surgery repair  . JOINT REPLACEMENT Right   . SHOULDER ARTHROSCOPY W/ ROTATOR CUFF REPAIR Left   . SHOULDER OPEN ROTATOR CUFF REPAIR Right   . TUBAL LIGATION       reports that she quit smoking about 30 years ago. Her smoking use included Cigarettes. She has a 30.00 pack-year smoking history. She has never used  smokeless tobacco. She reports that she does not drink alcohol or use drugs.  Allergies  Allergen Reactions  . Keflex [Cephalexin] Rash  . Aspirin     Blood in stool     Family History  Problem Relation Age of Onset  . Stroke Mother   . Cancer Sister        lung cancer     Prior to Admission medications   Medication Sig Start Date End Date Taking? Authorizing Provider  docusate sodium (COLACE) 100 MG capsule Take 100 mg by mouth daily as needed for moderate constipation.    Yes [provider]  HYDROcodone-acetaminophen (NORCO) 7.5-325 MG tablet Take 1 tablet by mouth every 6 (six) hours as needed for moderate pain. 11/12/16  Yes [name], MD  ondansetron (ZOFRAN) 8 MG tablet TAKE ONE TABLET BY MOUTH TWICE DAILY AS NEEDED FOR NAUSEA AND VOMITING 12/07/15  Yes [name], MD    Physical Exam: Vitals:   12/11/16 2315 12/12/16 0027 12/12/16 0130 12/12/16 0136  BP: (!) 147/78 (!) 146/66 138/70   Pulse: 65 61 (!) 59   Resp: 18  17   Temp:    98.5 F (36.9 C)  TempSrc:      SpO2: (!) 89% 96% 91%     Constitutional: NAD, calm, comfortable Eyes: PERRL, lids and conjunctivae normal ENMT: Mucous membranes are moist. Posterior pharynx clear of any exudate or  lesions.Normal dentition.  Neck: normal, supple, no masses, no thyromegaly Respiratory: clear to auscultation bilaterally, no wheezing, no crackles. Normal respiratory effort. No accessory muscle use.  Cardiovascular: Regular rate and rhythm, no murmurs / rubs / gallops. No extremity edema. 2+ pedal pulses. No carotid bruits.  Abdomen: no tenderness, no masses palpated. No hepatosplenomegaly. Bowel sounds positive.  Musculoskeletal: no clubbing / cyanosis. No joint deformity upper and lower extremities. Good ROM, no contractures. Normal muscle tone.  Skin: no rashes, lesions, ulcers. No induration Neurologic: 4+ / 5 in R leg, 5/5 else Psychiatric: Normal judgment and insight. Alert and oriented x 3. Normal mood.    Labs  on Admission: I have personally reviewed following labs and imaging studies  CBC:  Recent Labs Lab 12/11/16 1923 12/11/16 1959 12/12/16 0024 12/12/16 0048  WBC 5.2  --   --   --   NEUTROABS 3.9  --  3.4  --   HGB 11.3* 11.6*  --  11.2*  HCT 34.0* 34.0*  --  33.0*  MCV 97.7  --   --   --   PLT 178  --   --   --    Basic Metabolic Panel:  Recent Labs Lab 12/11/16 1923 12/11/16 1959 12/12/16 0048  NA 137 137 138  K 3.8 3.9 4.0  CL 104 103 102  CO2 25  --   --   GLUCOSE 241* 249* 215*  BUN 20 20 19   CREATININE 1.01* 0.90 0.90  CALCIUM 9.1  --   --    GFR: CrCl cannot be calculated (Unknown ideal weight.). Liver Function Tests:  Recent Labs Lab 12/11/16 1923  AST 17  ALT 13*  ALKPHOS 68  BILITOT 0.2*  PROT 7.4  ALBUMIN 3.9   No results for input(s): LIPASE, AMYLASE in the last 168 hours. No results for input(s): AMMONIA in the last 168 hours. Coagulation Profile:  Recent Labs Lab 12/11/16 1923 12/12/16 0024  INR 1.01 1.06   Cardiac Enzymes: No results for input(s): CKTOTAL, CKMB, CKMBINDEX, TROPONINI in the last 168 hours. BNP (last 3 results) No results for input(s): PROBNP in the last 8760 hours. HbA1C: No results for input(s): HGBA1C in the last 72 hours. CBG:  Recent Labs Lab 12/11/16 1442 12/12/16 0208  GLUCAP 196* 205*   Lipid Profile: No results for input(s): CHOL, HDL, LDLCALC, TRIG, CHOLHDL, LDLDIRECT in the last 72 hours. Thyroid Function Tests: No results for input(s): TSH, T4TOTAL, FREET4, T3FREE, THYROIDAB in the last 72 hours. Anemia Panel: No results for input(s): VITAMINB12, FOLATE, FERRITIN, TIBC, IRON, RETICCTPCT in the last 72 hours. Urine analysis:    Component Value Date/Time   COLORURINE YELLOW 12/12/2016 0155   APPEARANCEUR HAZY (A) 12/12/2016 0155   LABSPEC 1.017 12/12/2016 0155   LABSPEC 1.030 06/02/2016 1612   PHURINE 6.0 12/12/2016 0155   GLUCOSEU >=500 (A) 12/12/2016 0155   GLUCOSEU Negative 06/02/2016 1612     HGBUR NEGATIVE 12/12/2016 0155   BILIRUBINUR NEGATIVE 12/12/2016 0155   BILIRUBINUR Color Interference 06/02/2016 1612   KETONESUR 5 (A) 12/12/2016 0155   PROTEINUR 30 (A) 12/12/2016 0155   UROBILINOGEN Color Interference 06/02/2016 1612   NITRITE NEGATIVE 12/12/2016 0155   LEUKOCYTESUR SMALL (A) 12/12/2016 0155   LEUKOCYTESUR Color Interference 06/02/2016 1612    Radiological Exams on Admission: Ct Head Wo Contrast  Result Date: 12/11/2016 CLINICAL DATA:  Patient fell this morning hitting right side of face without loss consciousness. EXAM: CT HEAD WITHOUT CONTRAST CT MAXILLOFACIAL WITHOUT CONTRAST CT CERVICAL  SPINE WITHOUT CONTRAST TECHNIQUE: Multidetector CT imaging of the head, cervical spine, and maxillofacial structures were performed using the standard protocol without intravenous contrast. Multiplanar CT image reconstructions of the cervical spine and maxillofacial structures were also generated. COMPARISON:  09/24/2015 FINDINGS: CT HEAD FINDINGS Brain: Mild-to-moderate superficial atrophy. Chronic small vessel ischemic disease of periventricular and subcortical white matter. No large vascular territory infarct, hemorrhage, midline shift or edema. No intra-axial mass nor extra-axial fluid collections. Vascular: No hyperdense vessel or unexpected calcification. Skull: Negative for fracture or focal lesion. Other: None CT MAXILLOFACIAL FINDINGS Osseous: No fracture or mandibular dislocation. No destructive process. Orbits: Right lens surgical change.  Intact orbits and globes. Sinuses: Minimal mucosal thickening of the left maxillary sinus. Otherwise negative. Soft tissues: No significant soft tissue swelling. CT CERVICAL SPINE FINDINGS Alignment: Intact craniocervical relationship and atlantodental interval. Slight reversal cervical lordosis with apex at C5 secondary to degenerative disc disease. Skull base and vertebrae: No acute fracture. No primary bone lesion or focal pathologic process.  Soft tissues and spinal canal: No prevertebral fluid or swelling. No visible canal hematoma. Disc levels: Mild disc space narrowing C2-3 and C4-5, moderate at C6-7 and marked at C5-6. Small posterior marginal osteophytes are noted at C5-6 and C6-7. Bilateral uncovertebral joint osteoarthritis from C3-4 through C6-7 with spurring most pronounced at C5-6. Multilevel degenerative facet arthropathy wit mild-to-moderate left C5-6 neural foraminal encroachment from osteophytes. No jumped or perched facets. Upper chest: Negative. Other: None IMPRESSION: 1. No acute intracranial abnormality. 2. Chronic stable superficial atrophy with small vessel ischemic disease. 3. No acute maxillofacial fracture or dislocations. 4. Cervical spondylosis without acute fracture or posttraumatic subluxation. Electronically Signed   By: Ashley Royalty M.D.   On: 12/11/2016 18:01   Ct Cervical Spine Wo Contrast  Result Date: 12/11/2016 CLINICAL DATA:  Patient fell this morning hitting right side of face without loss consciousness. EXAM: CT HEAD WITHOUT CONTRAST CT MAXILLOFACIAL WITHOUT CONTRAST CT CERVICAL SPINE WITHOUT CONTRAST TECHNIQUE: Multidetector CT imaging of the head, cervical spine, and maxillofacial structures were performed using the standard protocol without intravenous contrast. Multiplanar CT image reconstructions of the cervical spine and maxillofacial structures were also generated. COMPARISON:  09/24/2015 FINDINGS: CT HEAD FINDINGS Brain: Mild-to-moderate superficial atrophy. Chronic small vessel ischemic disease of periventricular and subcortical white matter. No large vascular territory infarct, hemorrhage, midline shift or edema. No intra-axial mass nor extra-axial fluid collections. Vascular: No hyperdense vessel or unexpected calcification. Skull: Negative for fracture or focal lesion. Other: None CT MAXILLOFACIAL FINDINGS Osseous: No fracture or mandibular dislocation. No destructive process. Orbits: Right lens surgical  change.  Intact orbits and globes. Sinuses: Minimal mucosal thickening of the left maxillary sinus. Otherwise negative. Soft tissues: No significant soft tissue swelling. CT CERVICAL SPINE FINDINGS Alignment: Intact craniocervical relationship and atlantodental interval. Slight reversal cervical lordosis with apex at C5 secondary to degenerative disc disease. Skull base and vertebrae: No acute fracture. No primary bone lesion or focal pathologic process. Soft tissues and spinal canal: No prevertebral fluid or swelling. No visible canal hematoma. Disc levels: Mild disc space narrowing C2-3 and C4-5, moderate at C6-7 and marked at C5-6. Small posterior marginal osteophytes are noted at C5-6 and C6-7. Bilateral uncovertebral joint osteoarthritis from C3-4 through C6-7 with spurring most pronounced at C5-6. Multilevel degenerative facet arthropathy wit mild-to-moderate left C5-6 neural foraminal encroachment from osteophytes. No jumped or perched facets. Upper chest: Negative. Other: None IMPRESSION: 1. No acute intracranial abnormality. 2. Chronic stable superficial atrophy with small vessel ischemic disease. 3.  No acute maxillofacial fracture or dislocations. 4. Cervical spondylosis without acute fracture or posttraumatic subluxation. Electronically Signed   By: Ashley Royalty M.D.   On: 12/11/2016 18:01   Mr Brain Wo Contrast  Result Date: 12/12/2016 CLINICAL DATA:  Gait imbalance, struck head. History of hypertension, diabetes and pancreatic cancer. EXAM: MRI HEAD WITHOUT CONTRAST TECHNIQUE: Multiplanar, multiecho pulse sequences of the brain and surrounding structures were obtained without intravenous contrast. COMPARISON:  CT HEAD December 11, 2016 at 1732 hours FINDINGS: BRAIN: Patchy reduced diffusion mesial LEFT frontal lobe with low ADC values, faint susceptibility artifact and mild FLAIR T2 hyperintense signal. No susceptibility artifact to suggest hemorrhage. The ventricles and sulci are normal for patient's  age. No suspicious parenchymal signal, mass or mass effect. Scattered subcentimeter supratentorial white matter FLAIR T2 hyperintensities. Old small LEFT cerebellar infarct. Old RIGHT thalamus lacunar infarct. No abnormal extra-axial fluid collections. VASCULAR: Normal major intracranial vascular flow voids present at skull base. SKULL AND UPPER CERVICAL SPINE: No abnormal sellar expansion. No suspicious calvarial bone marrow signal. Craniocervical junction maintained. SINUSES/ORBITS: Trace paranasal sinus mucosal thickening. Mastoid air cells are well aerated. The included ocular globes and orbital contents are non-suspicious. Status post RIGHT ocular lens implant. OTHER: Patient is edentulous. IMPRESSION: 1. Acute small to moderate LEFT ACA territory infarct with petechial hemorrhage. 2. Mild chronic small vessel ischemic disease. Scattered old lacunar infarcts. Acute findings discussed with and reconfirmed by Judson Roch, charge nurse in [facility] on 12/12/2016 at 12:20 am. Electronically Signed   By: Elon Alas M.D.   On: 12/12/2016 00:22   Ct Maxillofacial Wo Cm  Result Date: 12/11/2016 CLINICAL DATA:  Patient fell this morning hitting right side of face without loss consciousness. EXAM: CT HEAD WITHOUT CONTRAST CT MAXILLOFACIAL WITHOUT CONTRAST CT CERVICAL SPINE WITHOUT CONTRAST TECHNIQUE: Multidetector CT imaging of the head, cervical spine, and maxillofacial structures were performed using the standard protocol without intravenous contrast. Multiplanar CT image reconstructions of the cervical spine and maxillofacial structures were also generated. COMPARISON:  09/24/2015 FINDINGS: CT HEAD FINDINGS Brain: Mild-to-moderate superficial atrophy. Chronic small vessel ischemic disease of periventricular and subcortical white matter. No large vascular territory infarct, hemorrhage, midline shift or edema. No intra-axial mass nor extra-axial fluid collections. Vascular: No hyperdense vessel or unexpected  calcification. Skull: Negative for fracture or focal lesion. Other: None CT MAXILLOFACIAL FINDINGS Osseous: No fracture or mandibular dislocation. No destructive process. Orbits: Right lens surgical change.  Intact orbits and globes. Sinuses: Minimal mucosal thickening of the left maxillary sinus. Otherwise negative. Soft tissues: No significant soft tissue swelling. CT CERVICAL SPINE FINDINGS Alignment: Intact craniocervical relationship and atlantodental interval. Slight reversal cervical lordosis with apex at C5 secondary to degenerative disc disease. Skull base and vertebrae: No acute fracture. No primary bone lesion or focal pathologic process. Soft tissues and spinal canal: No prevertebral fluid or swelling. No visible canal hematoma. Disc levels: Mild disc space narrowing C2-3 and C4-5, moderate at C6-7 and marked at C5-6. Small posterior marginal osteophytes are noted at C5-6 and C6-7. Bilateral uncovertebral joint osteoarthritis from C3-4 through C6-7 with spurring most pronounced at C5-6. Multilevel degenerative facet arthropathy wit mild-to-moderate left C5-6 neural foraminal encroachment from osteophytes. No jumped or perched facets. Upper chest: Negative. Other: None IMPRESSION: 1. No acute intracranial abnormality. 2. Chronic stable superficial atrophy with small vessel ischemic disease. 3. No acute maxillofacial fracture or dislocations. 4. Cervical spondylosis without acute fracture or posttraumatic subluxation. Electronically Signed   By: Ashley Royalty M.D.   On:  12/11/2016 18:01    EKG: Independently reviewed.  Assessment/Plan Principal Problem:   Acute ischemic stroke [facility]) Active Problems:   Carcinoma of body of pancreas ([place])   Hypertension   Diabetes mellitus without complication ([place])    1. Acute ischemic stroke - 1. Stroke pathway 2. MRA 3. 2d echo 4. carotid doppler 5. A1C, lipid profile 6. Will delay onset of ASA until neuro feels appropriate (and let them place the order)  given the partial hemorrhagic transformation. 7. Tele monitor 2. DM2 - 1. SSI AC 3. Pancreatic adenocarcinoma - 1. Stable as of March 2. But may wish to obtain new imaging given presence of new stroke 3. Will hold off on ordering anything for now though 4. HTN - allow permissive HTN  DVT prophylaxis: SCDs given the partial hemorrhagic transformation Code Status: Full Family Communication: Daughter at bedside Disposition Plan: Home after admit Consults called: Neuro Admission status: Admit to inpatient   [place], [facility] Pager [phone]  If 7AM-7PM, please contact day team taking care of patient [email or web address] Password [email or web address]  12/12/2016, 2:58 AM

## 2016-12-12 NOTE — ED Notes (Signed)
0200 insulin not given per MD Alcario Drought verbal order.

## 2016-12-12 NOTE — ED Notes (Signed)
Date and time results received: 12/12/16 12:22 AM    Test: MRI Brain Critical Value: Acute Infarct L ACA  Name of Provider Notified: Orlie Dakin, MD  Orders Received? Or Actions Taken?:

## 2016-12-12 NOTE — Care Management Note (Signed)
Case Management Note  Patient Details  Name: Nicole Sherman MRN: [id number] Date of Birth: 07-Mar-1934  Subjective/Objective:   Pt admitted with CVA. She is from home with spouse.                 Action/Plan: Awaiting PT/OT recommendations. CM following for d/c needs, physician orders.   Expected Discharge Date:                  Expected Discharge Plan:     In-House Referral:     Discharge planning Services     Post Acute Care Choice:    Choice offered to:     DME Arranged:    DME Agency:     HH Arranged:    HH Agency:     Status of Service:  In process, will continue to follow  If discussed at Long Length of Stay Meetings, dates discussed:    Additional Comments:  Pollie Friar, RN 12/12/2016, 12:55 PM

## 2016-12-12 NOTE — Progress Notes (Signed)
  Echocardiogram 2D Echocardiogram has been performed.  Cheri Ayotte T Travelle Mcclimans 12/12/2016, 1:20 PM

## 2016-12-12 NOTE — Progress Notes (Signed)
Pt being discharged per orders from MD. Pt and family educated on discharge instructions. Pt and family verbalized understanding of instructions. All questions and concerns were addressed. Pt's IV was removed prior to discharge. Pt exited hospital via wheelchair. 

## 2016-12-12 NOTE — Evaluation (Signed)
Physical Therapy Evaluation Patient Details Name: Nicole Sherman MRN: [id number] DOB: 02/19/1934 Today's Date: 12/12/2016   History of Present Illness  Pt is an 83 y/o female admitted from home after sustaining a fall. Pt found to have an acute small to moderate L ACA stroke with petechial hemorrhage. PMH including but not limited to pancreatic cancer, DM and HTN.  Clinical Impression  Pt presented supine in bed with HOB elevated, awake and willing to participate in therapy session. Prior to admission, pt reported that she was independent with all functional mobility and ADLs. Pt's spouse and daughter were present throughout session. Pt ambulated a short distance in hallway with use of RW and min guard for safety. Pt would continue to benefit from skilled physical therapy services at this time while admitted and after d/c to address the below listed limitations in order to improve overall safety and independence with functional mobility.     Follow Up Recommendations Home health PT    Equipment Recommendations  None recommended by PT    Recommendations for Other Services       Precautions / Restrictions Precautions Precautions: Fall Restrictions Weight Bearing Restrictions: No      Mobility  Bed Mobility Overal bed mobility: Needs Assistance Bed Mobility: Supine to Sit;Sit to Supine     Supine to sit: Min guard;HOB elevated Sit to supine: Min guard   General bed mobility comments: increased time and effort, use of bed rails, min guard for safety  Transfers Overall transfer level: Needs assistance Equipment used: Rolling walker (2 wheeled) Transfers: Sit to/from Stand Sit to Stand: Min assist         General transfer comment: increased time and effort, cueing for technique and min A to rise from EOB x1 and toilet x1  Ambulation/Gait Ambulation/Gait assistance: Min guard Ambulation Distance (Feet): 50 Feet Assistive device: Rolling walker (2 wheeled) Gait  Pattern/deviations: Step-through pattern;Decreased stride length;Drifts right/left Gait velocity: decreased Gait velocity interpretation: Below normal speed for age/gender General Gait Details: modest instability but no overt LOB or need for physical assistance, min guard for safety  Stairs            Wheelchair Mobility    Modified Rankin (Stroke Patients Only) Modified Rankin (Stroke Patients Only) Pre-Morbid Rankin Score: No symptoms Modified Rankin: Moderate disability     Balance Overall balance assessment: Needs assistance Sitting-balance support: Feet supported Sitting balance-Leahy Scale: Good     Standing balance support: During functional activity;Bilateral upper extremity supported Standing balance-Leahy Scale: Poor Standing balance comment: pt reliant on bilateral UE supports on RW                             Pertinent Vitals/Pain Pain Assessment: No/denies pain    Home Living Family/patient expects to be discharged to:: Private residence Living Arrangements: Spouse/significant other Available Help at Discharge: Family;Available 24 hours/day Type of Home: House Home Access: Stairs to enter Entrance Stairs-Rails: Right;Left;Can reach both Entrance Stairs-Number of Steps: 5 Home Layout: One level Home Equipment: Clinical cytogeneticist - 2 wheels;Crutches      Prior Function Level of Independence: Independent               Hand Dominance   Dominant Hand: Right    Extremity/Trunk Assessment   Upper Extremity Assessment Upper Extremity Assessment: Overall WFL for tasks assessed    Lower Extremity Assessment Lower Extremity Assessment: Generalized weakness       Communication  Communication: HOH  Cognition Arousal/Alertness: Awake/alert Behavior During Therapy: WFL for tasks assessed/performed Overall Cognitive Status: Within Functional Limits for tasks assessed                                         General Comments      Exercises     Assessment/Plan    PT Assessment Patient needs continued PT services  PT Problem List Decreased balance;Decreased mobility;Decreased coordination;Decreased knowledge of use of DME;Decreased safety awareness       PT Treatment Interventions DME instruction;Gait training;Stair training;Therapeutic activities;Functional mobility training;Therapeutic exercise;Balance training;Neuromuscular re-education;Patient/family education    PT Goals (Current goals can be found in the Care Plan section)  Acute Rehab PT Goals Patient Stated Goal: return home PT Goal Formulation: With patient Time For Goal Achievement: 12/26/16 Potential to Achieve Goals: Good    Frequency Min 3X/week   Barriers to discharge        Co-evaluation               AM-PAC PT "6 Clicks" Daily Activity  Outcome Measure Difficulty turning over in bed (including adjusting bedclothes, sheets and blankets)?: A Little Difficulty moving from lying on back to sitting on the side of the bed? : A Little Difficulty sitting down on and standing up from a chair with arms (e.g., wheelchair, bedside commode, etc,.)?: Total Help needed moving to and from a bed to chair (including a wheelchair)?: None Help needed walking in hospital room?: A Little Help needed climbing 3-5 steps with a railing? : A Little 6 Click Score: 17    End of Session Equipment Utilized During Treatment: Gait belt Activity Tolerance: Patient tolerated treatment well Patient left: in bed;with call bell/phone within reach;with bed alarm set;with family/visitor present Nurse Communication: Mobility status PT Visit Diagnosis: Other abnormalities of gait and mobility (R26.89);Other symptoms and signs involving the nervous system (R29.898)    Time: 2263-3354 PT Time Calculation (min) (ACUTE ONLY): 33 min   Charges:   PT Evaluation $PT Eval Moderate Complexity: 1 Mod PT Treatments $Gait Training: 8-22 mins    PT G Codes:        [place], PT, DPT [place] 12/12/2016, 3:08 PM

## 2016-12-12 NOTE — ED Provider Notes (Signed)
Care assumed from Dr. Winfred Leeds. MRI p[ositive for stroke. Discussed with Dr. Rory Percy of neurology service, who agrees to see the patient in consult. Discussed with Dr. Alcario Drought of [facility], who agrees to admit the patient.   Delora Fuel, MD 89/79/15 [phone]

## 2016-12-12 NOTE — Progress Notes (Signed)
*  PRELIMINARY RESULTS* Vascular Ultrasound Carotid Duplex (Doppler) has been completed.  Preliminary findings: Bilateral 1-39% ICA stenosis, antegrade vertebral flow.   Everrett Coombe 12/12/2016, 11:54 AM

## 2016-12-12 NOTE — Discharge Summary (Signed)
Physician Discharge Summary  Nicole Sherman SEG:[id number] DOB: 1933/11/06 DOA: 12/11/2016  PCP: Nicole Fess, MD  Admit date: 12/11/2016 Discharge date: 12/12/2016  Admitted From: Home Disposition: Home  Recommendations for Outpatient Follow-up:  1. Follow up with PCP in 1 week 2. Follow up with neurology in 6 weeks 3. Please follow up on the following pending results: Echocardiogram  Home Health: Physical therapy Equipment/Devices: None  Discharge Condition: Stable CODE STATUS: DNR/DNI Diet recommendation: Heart healthy   Brief/Interim Summary:  Admission HPI written by Nicole Quill, DO   Chief Complaint: Fall  HPI: Nicole Sherman is a 83 y.o. female with medical history significant of pancreatic adenocarcinoma, s/p radiation and chemotherapy but inoperable.  Currently not on any treatment.  Also h/o DM2, HTN.  Took herself off of all meds after diagnosis of pancreatic cancer back in Jan 2017.  At 8:30 AM, patients husband heard a thud, found patient down on ground.  Bruise to R forehead and didn't feal right.  Went first to [facility], then [facility].   ED Course: MRI at [facility] confirms acute small to moderate Left ACA stroke with petechial hemorrhage.  Biggest complaint at this point is headache patient states.    Hospital course:  Acute ischemic stroke Seen on MRI as an acute small to moderate left ACA territory infarct with petechial hemorrhage. Neurology consulted and recommended aspirin. 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 Carotid Doppler significant for. 1-39% stenosis. LDL of 132. A1c of 11.2. Physical therapy recommended home health physical therapy. Outpatient follow-up with primary care physician and neurology.  Diabetes mellitus type 2 Sliding-scale insulin while inpatient. Patient's A1c is significantly high. Taken off of medications secondary to pancreatic cancer diagnosis. Continue to weigh pros and cons with regard to  management with primary care physician.  Pancreatic adenocarcinoma Continue outpatient follow-up.  Essential hypertension Permissive hypertension. Patient is currently not on medication regimen.   Discharge Diagnoses:  Principal Problem:   Acute ischemic stroke [facility]) Active Problems:   Carcinoma of body of pancreas ([place])   Hypertension   Diabetes mellitus without complication [facility])    Discharge Instructions  Discharge Instructions    Diet - low sodium heart healthy    Complete by:  As directed    Increase activity slowly    Complete by:  As directed      Allergies as of 12/12/2016      Reactions   Keflex [cephalexin] Rash   Aspirin    Blood in stool       Medication List    TAKE these medications   aspirin 81 MG chewable tablet Chew 1 tablet (81 mg total) by mouth daily.   atorvastatin 40 MG tablet Commonly known as:  LIPITOR Take 1 tablet (40 mg total) by mouth daily at 6 PM.   docusate sodium 100 MG capsule Commonly known as:  COLACE Take 100 mg by mouth daily as needed for moderate constipation.   HYDROcodone-acetaminophen 7.5-325 MG tablet Commonly known as:  NORCO Take 1 tablet by mouth every 6 (six) hours as needed for moderate pain.   ondansetron 8 MG tablet Commonly known as:  ZOFRAN TAKE ONE TABLET BY MOUTH TWICE DAILY AS NEEDED FOR NAUSEA AND VOMITING      Follow-up Information    Sherman, Nicole Bihari, MD. Schedule an appointment as soon as possible for a visit in 1 week(s).   Specialty:  Family Medicine Contact information: [address] [place] [address] [phone]  Nicole Fila, MD. Schedule an appointment as soon as possible for a visit in 6 week(s).   Specialties:  Neurology, Radiology Contact information: [address] [place] [address] [phone]          Allergies  Allergen Reactions  . Keflex [Cephalexin] Rash  . Aspirin     Blood in stool      Consultations:  Neurology   Procedures/Studies: Ct Head Wo Contrast  Result Date: 12/11/2016 CLINICAL DATA:  Patient fell this morning hitting right side of face without loss consciousness. EXAM: CT HEAD WITHOUT CONTRAST CT MAXILLOFACIAL WITHOUT CONTRAST CT CERVICAL SPINE WITHOUT CONTRAST TECHNIQUE: Multidetector CT imaging of the head, cervical spine, and maxillofacial structures were performed using the standard protocol without intravenous contrast. Multiplanar CT image reconstructions of the cervical spine and maxillofacial structures were also generated. COMPARISON:  09/24/2015 FINDINGS: CT HEAD FINDINGS Brain: Mild-to-moderate superficial atrophy. Chronic small vessel ischemic disease of periventricular and subcortical white matter. No large vascular territory infarct, hemorrhage, midline shift or edema. No intra-axial mass nor extra-axial fluid collections. Vascular: No hyperdense vessel or unexpected calcification. Skull: Negative for fracture or focal lesion. Other: None CT MAXILLOFACIAL FINDINGS Osseous: No fracture or mandibular dislocation. No destructive process. Orbits: Right lens surgical change.  Intact orbits and globes. Sinuses: Minimal mucosal thickening of the left maxillary sinus. Otherwise negative. Soft tissues: No significant soft tissue swelling. CT CERVICAL SPINE FINDINGS Alignment: Intact craniocervical relationship and atlantodental interval. Slight reversal cervical lordosis with apex at C5 secondary to degenerative disc disease. Skull base and vertebrae: No acute fracture. No primary bone lesion or focal pathologic process. Soft tissues and spinal canal: No prevertebral fluid or swelling. No visible canal hematoma. Disc levels: Mild disc space narrowing C2-3 and C4-5, moderate at C6-7 and marked at C5-6. Small posterior marginal osteophytes are noted at C5-6 and C6-7. Bilateral uncovertebral joint osteoarthritis from C3-4 through C6-7 with spurring most pronounced at C5-6.  Multilevel degenerative facet arthropathy wit mild-to-moderate left C5-6 neural foraminal encroachment from osteophytes. No jumped or perched facets. Upper chest: Negative. Other: None IMPRESSION: 1. No acute intracranial abnormality. 2. Chronic stable superficial atrophy with small vessel ischemic disease. 3. No acute maxillofacial fracture or dislocations. 4. Cervical spondylosis without acute fracture or posttraumatic subluxation. Electronically Signed   By: Ashley Royalty M.D.   On: 12/11/2016 18:01   Ct Cervical Spine Wo Contrast  Result Date: 12/11/2016 CLINICAL DATA:  Patient fell this morning hitting right side of face without loss consciousness. EXAM: CT HEAD WITHOUT CONTRAST CT MAXILLOFACIAL WITHOUT CONTRAST CT CERVICAL SPINE WITHOUT CONTRAST TECHNIQUE: Multidetector CT imaging of the head, cervical spine, and maxillofacial structures were performed using the standard protocol without intravenous contrast. Multiplanar CT image reconstructions of the cervical spine and maxillofacial structures were also generated. COMPARISON:  09/24/2015 FINDINGS: CT HEAD FINDINGS Brain: Mild-to-moderate superficial atrophy. Chronic small vessel ischemic disease of periventricular and subcortical white matter. No large vascular territory infarct, hemorrhage, midline shift or edema. No intra-axial mass nor extra-axial fluid collections. Vascular: No hyperdense vessel or unexpected calcification. Skull: Negative for fracture or focal lesion. Other: None CT MAXILLOFACIAL FINDINGS Osseous: No fracture or mandibular dislocation. No destructive process. Orbits: Right lens surgical change.  Intact orbits and globes. Sinuses: Minimal mucosal thickening of the left maxillary sinus. Otherwise negative. Soft tissues: No significant soft tissue swelling. CT CERVICAL SPINE FINDINGS Alignment: Intact craniocervical relationship and atlantodental interval. Slight reversal cervical lordosis with apex at C5 secondary to degenerative disc  disease. Skull base  and vertebrae: No acute fracture. No primary bone lesion or focal pathologic process. Soft tissues and spinal canal: No prevertebral fluid or swelling. No visible canal hematoma. Disc levels: Mild disc space narrowing C2-3 and C4-5, moderate at C6-7 and marked at C5-6. Small posterior marginal osteophytes are noted at C5-6 and C6-7. Bilateral uncovertebral joint osteoarthritis from C3-4 through C6-7 with spurring most pronounced at C5-6. Multilevel degenerative facet arthropathy wit mild-to-moderate left C5-6 neural foraminal encroachment from osteophytes. No jumped or perched facets. Upper chest: Negative. Other: None IMPRESSION: 1. No acute intracranial abnormality. 2. Chronic stable superficial atrophy with small vessel ischemic disease. 3. No acute maxillofacial fracture or dislocations. 4. Cervical spondylosis without acute fracture or posttraumatic subluxation. Electronically Signed   By: Ashley Royalty M.D.   On: 12/11/2016 18:01   Mr Brain Wo Contrast  Result Date: 12/12/2016 CLINICAL DATA:  Gait imbalance, struck head. History of hypertension, diabetes and pancreatic cancer. EXAM: MRI HEAD WITHOUT CONTRAST TECHNIQUE: Multiplanar, multiecho pulse sequences of the brain and surrounding structures were obtained without intravenous contrast. COMPARISON:  CT HEAD December 11, 2016 at 1732 hours FINDINGS: BRAIN: Patchy reduced diffusion mesial LEFT frontal lobe with low ADC values, faint susceptibility artifact and mild FLAIR T2 hyperintense signal. No susceptibility artifact to suggest hemorrhage. The ventricles and sulci are normal for patient's age. No suspicious parenchymal signal, mass or mass effect. Scattered subcentimeter supratentorial white matter FLAIR T2 hyperintensities. Old small LEFT cerebellar infarct. Old RIGHT thalamus lacunar infarct. No abnormal extra-axial fluid collections. VASCULAR: Normal major intracranial vascular flow voids present at skull base. SKULL AND UPPER  CERVICAL SPINE: No abnormal sellar expansion. No suspicious calvarial bone marrow signal. Craniocervical junction maintained. SINUSES/ORBITS: Trace paranasal sinus mucosal thickening. Mastoid air cells are well aerated. The included ocular globes and orbital contents are non-suspicious. Status post RIGHT ocular lens implant. OTHER: Patient is edentulous. IMPRESSION: 1. Acute small to moderate LEFT ACA territory infarct with petechial hemorrhage. 2. Mild chronic small vessel ischemic disease. Scattered old lacunar infarcts. Acute findings discussed with and reconfirmed by Judson Roch, charge nurse in [facility] on 12/12/2016 at 12:20 am. Electronically Signed   By: Elon Alas M.D.   On: 12/12/2016 00:22   Mr Jodene Nam Head Wo Contrast  Result Date: 12/12/2016 CLINICAL DATA:  Gait imbalance, struck head. Follow-up stroke. History of hypertension, diabetes and pancreatic cancer. EXAM: MRA HEAD WITHOUT CONTRAST TECHNIQUE: Angiographic images of the Circle of Willis were obtained using MRA technique without intravenous contrast. COMPARISON:  MRI of the head December 11, 2016 FINDINGS: ANTERIOR CIRCULATION: Normal flow related enhancement of the included cervical, petrous, cavernous and supraclinoid internal carotid arteries. Bilateral anterior cerebral arteries arise from LEFT A1-2 junction. Medially directed 2 mm LEFT A1-2 junction aneurysm. Patent anterior and middle cerebral arteries, including distal segments. No large vessel occlusion, high-grade stenosis. POSTERIOR CIRCULATION: RIGHT vertebral artery is dominant. Basilar artery is patent, with normal flow related enhancement of the main branch vessels. Patent posterior cerebral arteries. Robust RIGHT posterior communicating artery present. No large vessel occlusion, high-grade stenosis,  aneurysm. ANATOMIC VARIANTS: Aplastic RIGHT A1 segment. Source images and MIP images were reviewed. IMPRESSION: 1. No emergent large vessel occlusion or stenosis. 2. 2 mm LEFT A1-2  junction aneurysm. Electronically Signed   By: Elon Alas M.D.   On: 12/12/2016 05:25   Ct Maxillofacial Wo Cm  Result Date: 12/11/2016 CLINICAL DATA:  Patient fell this morning hitting right side of face without loss consciousness. EXAM: CT HEAD WITHOUT CONTRAST CT MAXILLOFACIAL WITHOUT  CONTRAST CT CERVICAL SPINE WITHOUT CONTRAST TECHNIQUE: Multidetector CT imaging of the head, cervical spine, and maxillofacial structures were performed using the standard protocol without intravenous contrast. Multiplanar CT image reconstructions of the cervical spine and maxillofacial structures were also generated. COMPARISON:  09/24/2015 FINDINGS: CT HEAD FINDINGS Brain: Mild-to-moderate superficial atrophy. Chronic small vessel ischemic disease of periventricular and subcortical white matter. No large vascular territory infarct, hemorrhage, midline shift or edema. No intra-axial mass nor extra-axial fluid collections. Vascular: No hyperdense vessel or unexpected calcification. Skull: Negative for fracture or focal lesion. Other: None CT MAXILLOFACIAL FINDINGS Osseous: No fracture or mandibular dislocation. No destructive process. Orbits: Right lens surgical change.  Intact orbits and globes. Sinuses: Minimal mucosal thickening of the left maxillary sinus. Otherwise negative. Soft tissues: No significant soft tissue swelling. CT CERVICAL SPINE FINDINGS Alignment: Intact craniocervical relationship and atlantodental interval. Slight reversal cervical lordosis with apex at C5 secondary to degenerative disc disease. Skull base and vertebrae: No acute fracture. No primary bone lesion or focal pathologic process. Soft tissues and spinal canal: No prevertebral fluid or swelling. No visible canal hematoma. Disc levels: Mild disc space narrowing C2-3 and C4-5, moderate at C6-7 and marked at C5-6. Small posterior marginal osteophytes are noted at C5-6 and C6-7. Bilateral uncovertebral joint osteoarthritis from C3-4 through C6-7  with spurring most pronounced at C5-6. Multilevel degenerative facet arthropathy wit mild-to-moderate left C5-6 neural foraminal encroachment from osteophytes. No jumped or perched facets. Upper chest: Negative. Other: None IMPRESSION: 1. No acute intracranial abnormality. 2. Chronic stable superficial atrophy with small vessel ischemic disease. 3. No acute maxillofacial fracture or dislocations. 4. Cervical spondylosis without acute fracture or posttraumatic subluxation. Electronically Signed   By: Ashley Royalty M.D.   On: 12/11/2016 18:01     Subjective: No concerns per patient. No chest pain and dyspnea.  Discharge Exam: Vitals:   12/12/16 1200 12/12/16 1402  BP: 139/64 (!) 115/51  Pulse: (!) 52 (!) 57  Resp: 16   Temp:    SpO2: 97% 99%   Vitals:   12/12/16 0802 12/12/16 1002 12/12/16 1200 12/12/16 1402  BP: (!) 105/52 (!) 126/49 139/64 (!) 115/51  Pulse: (!) 55 (!) 56 (!) 52 (!) 57  Resp:   16   Temp:      TempSrc:      SpO2: 96% 97% 97% 99%    General: Pt is alert, awake, not in acute distress Cardiovascular: RRR, S1/S2 +, no rubs, no gallops Respiratory: CTA bilaterally, no wheezing, no rhonchi Abdominal: Soft, NT, ND, bowel sounds + Extremities: no edema, no cyanosis    The results of significant diagnostics from this hospitalization (including imaging, microbiology, ancillary and laboratory) are listed below for reference.     Microbiology: No results found for this or any previous visit (from the past 240 hour(s)).   Labs: BNP (last 3 results) No results for input(s): BNP in the last 8760 hours. Basic Metabolic Panel:  Recent Labs Lab 12/11/16 1923 12/11/16 1959 12/12/16 0048  NA 137 137 138  K 3.8 3.9 4.0  CL 104 103 102  CO2 25  --   --   GLUCOSE 241* 249* 215*  BUN 20 20 19   CREATININE 1.01* 0.90 0.90  CALCIUM 9.1  --   --    Liver Function Tests:  Recent Labs Lab 12/11/16 1923  AST 17  ALT 13*  ALKPHOS 68  BILITOT 0.2*  PROT 7.4  ALBUMIN  3.9   No results for input(s): LIPASE, AMYLASE in the  last 168 hours. No results for input(s): AMMONIA in the last 168 hours. CBC:  Recent Labs Lab 12/11/16 1923 12/11/16 1959 12/12/16 0024 12/12/16 0048  WBC 5.2  --   --   --   NEUTROABS 3.9  --  3.4  --   HGB 11.3* 11.6*  --  11.2*  HCT 34.0* 34.0*  --  33.0*  MCV 97.7  --   --   --   PLT 178  --   --   --    Cardiac Enzymes: No results for input(s): CKTOTAL, CKMB, CKMBINDEX, TROPONINI in the last 168 hours. BNP: Invalid input(s): POCBNP CBG:  Recent Labs Lab 12/11/16 1442 12/12/16 0208 12/12/16 0837 12/12/16 1147  GLUCAP 196* 205* 217* 171*   D-Dimer No results for input(s): DDIMER in the last 72 hours. Hgb A1c  Recent Labs  12/12/16 0434  HGBA1C 9.9*   Lipid Profile  Recent Labs  12/12/16 0434  CHOL 235*  HDL 46  LDLCALC 132*  TRIG 286*  CHOLHDL 5.1   Thyroid function studies No results for input(s): TSH, T4TOTAL, T3FREE, THYROIDAB in the last 72 hours.  Invalid input(s): FREET3 Anemia work up No results for input(s): VITAMINB12, FOLATE, FERRITIN, TIBC, IRON, RETICCTPCT in the last 72 hours. Urinalysis    Component Value Date/Time   COLORURINE YELLOW 12/12/2016 0155   APPEARANCEUR HAZY (A) 12/12/2016 0155   LABSPEC 1.017 12/12/2016 0155   LABSPEC 1.030 06/02/2016 1612   PHURINE 6.0 12/12/2016 0155   GLUCOSEU >=500 (A) 12/12/2016 0155   GLUCOSEU Negative 06/02/2016 1612   HGBUR NEGATIVE 12/12/2016 0155   BILIRUBINUR NEGATIVE 12/12/2016 0155   BILIRUBINUR Color Interference 06/02/2016 1612   KETONESUR 5 (A) 12/12/2016 0155   PROTEINUR 30 (A) 12/12/2016 0155   UROBILINOGEN Color Interference 06/02/2016 1612   NITRITE NEGATIVE 12/12/2016 0155   LEUKOCYTESUR SMALL (A) 12/12/2016 0155   LEUKOCYTESUR Color Interference 06/02/2016 1612     Time coordinating discharge: Over 30 minutes  SIGNED:   Cordelia Poche, MD [facility] 12/12/2016, 4:23 PM Pager [phone]  If  7PM-7AM, please contact night-coverage [email or web address] Password [email or web address]

## 2016-12-12 NOTE — Discharge Instructions (Signed)
Nicole Sherman,  You were admitted because of a stroke. You were started on aspirin and atorvastatin to help reduce your risk of recurrent stroke. Please follow-up with your primary care physician and the neurologist.

## 2016-12-12 NOTE — Progress Notes (Signed)
Oncology brief follow-up note  I stop by to see patient and met her husband and daughter also in her room. She was admitted yesterday for acute stroke, diffuse better today, no significant residual weakness. She is being discharged home tonight.  I encouraged her to participate home physical therapy. I agree with aspirin, but not an discoloration due to her previous GI bleeding. Continue supportive care.  I will reschedule her appointment to Sep 2018. Her daughter will call.  She knows to contact me if she has any questions.  [name]  12/12/2016

## 2016-12-24 ENCOUNTER — Ambulatory Visit: Payer: Medicare Other | Admitting: Hematology

## 2016-12-24 ENCOUNTER — Other Ambulatory Visit: Payer: Medicare Other

## 2017-01-01 ENCOUNTER — Other Ambulatory Visit: Payer: Self-pay | Admitting: *Deleted

## 2017-01-01 ENCOUNTER — Telehealth: Payer: Self-pay | Admitting: *Deleted

## 2017-01-01 DIAGNOSIS — C257 Malignant neoplasm of other parts of pancreas: Secondary | ICD-10-CM

## 2017-01-01 MED ORDER — HYDROCODONE-ACETAMINOPHEN 7.5-325 MG PO TABS: 1.0000 | ORAL_TABLET | Freq: Four times a day (QID) | ORAL | 0 refills | Status: DC | PRN

## 2017-01-01 MED FILL — HYDROCODON-APAP 7.5-325: 7.5-325 | 30 days supply | Qty: 120 | Fill #0

## 2017-01-01 NOTE — Telephone Encounter (Signed)
Received call from daughter Lurline Del requesting refill of Hydrocodone for pt. Nicole Haw' s     Phone     [phone].

## 2017-01-08 ENCOUNTER — Other Ambulatory Visit: Payer: Self-pay | Admitting: Hematology

## 2017-01-08 DIAGNOSIS — C251 Malignant neoplasm of body of pancreas: Secondary | ICD-10-CM

## 2017-01-09 ENCOUNTER — Other Ambulatory Visit: Payer: Self-pay | Admitting: Hematology

## 2017-01-09 MED ORDER — ONDANSETRON HCL 8 MG PO TABS: 8.0000 mg | ORAL_TABLET | Freq: Two times a day (BID) | ORAL | 2 refills | Status: DC

## 2017-01-22 NOTE — Progress Notes (Signed)
Nicole Sherman  [phone] [phone]  Clinic Follow Up Note   Patient Care Team: Hulan Fess, MD as PCP - General (Family Medicine) [name], MD as Consulting Physician (Hematology) Arta Silence, MD as Consulting Physician (Gastroenterology) 01/23/2017  CHIEF COMPLAINTS:  Follow up unresectable pancreatic adenocarcinoma    OTHER RELATED ISSUES 1. Left LE DVT diagnosed on 06/09/2015, started on lovenox and bridged to coumadin, which was held on 10/22/2068 due to GI bleeding. Changed to Xarelto on 10/31/2015, held due to GI bleeding   Oncology History   Pancreatic cancer [facility])   Staging form: Pancreas, AJCC 7th Edition     Clinical stage from 05/27/2014: Stage IIB (T2, N1, M0) - Signed by [name], MD on 05/31/2015       Carcinoma of body of pancreas ([place])   05/16/2015 Imaging    CT chest, abdomen and pelvis with contrast showed a bulky mass at pancreatic neck/body, tumor encases the celiac and proximal branches and a cruise the main portal vein, peripancreatic adenopathy. no distant mets.       05/28/2015 Initial Biopsy    Pancreatic mass needle biopsy showed well differentiated adenocarcinoma      05/31/2015 Initial Diagnosis    Pancreatic cancer ([place])      05/31/2015 Tumor Marker    CA19.9 =375      06/05/2015 - 07/03/2015 Chemotherapy    weekly gemcitabine 1071m/m2, dose reduced to 8579mm2 on week 2 due to tolerance issue      08/27/2015 - 10/30/2015 Chemotherapy    xeloda 10003mid, 14 days on, 7 days off, stopped due to disease progression       11/22/2015 - 12/05/2015 Radiation Therapy    Radiation to her pancreatic cancer       02/28/2016 Imaging    CT c/a/p with contrast IMPRESSION: 1. Decreased size of an infiltrative pancreatic head and neck mass, consistent with adenocarcinoma. Improvement in biliary duct dilatation. 2. No evidence of metastatic disease. 3. Similar arterial and venous involvement with  secondary gastroepiploic collaterals and gastric varices. 4. Similar small volume abdominal pelvic ascites. 5.  No acute process or evidence of metastatic disease in the chest. 6.  Coronary artery atherosclerosis. Aortic atherosclerosis. 7. Suspect gastric antral wall thickening, accentuated by underdistention. This may relate to radiation induced gastritis.      07/04/2016 Imaging    CT Abdomen Pelvis W contrast IMPRESSION: 1. No appreciable change in infiltrative hypoenhancing pancreatic body malignancy, with stable vascular involvement as detailed. 2. No evidence of metastatic disease in the abdomen or pelvis. 3. Stable mildly dilated central intrahepatic bile ducts and common bile duct. 4. Small volume free fluid in the deep pelvis. 5. Additional findings include aortic atherosclerosis, stable ectasia of the infrarenal abdominal aorta with maximum diameter 2.5 cm, decreased nonspecific mild wall thickening in the gastric antrum possibly representing evolving postradiation change, and diffuse colonic diverticulosis.      07/04/2016 Imaging    CT A/P IMPRESSION: 1. No appreciable change in infiltrative hypoenhancing pancreatic body malignancy, with stable vascular involvement as detailed. 2. No evidence of metastatic disease in the abdomen or pelvis. 3. Stable mildly dilated central intrahepatic bile ducts and common bile duct. 4. Small volume free fluid in the deep pelvis. 5. Additional findings include aortic atherosclerosis, stable ectasia of the infrarenal abdominal aorta with maximum diameter 2.5 cm, decreased nonspecific mild wall thickening in the gastric antrum possibly representing evolving postradiation change, and diffuse colonic diverticulosis.      12/11/2016 -  12/12/2016 Hospital Admission    Admit date: 12/11/16 Admission diagnosis: Stroke Additional comments: on aspirin and will continue with rehab and supportive care       HISTORY OF PRESENTING ILLNESS:   Nicole Sherman 83 y.o. female is here because of her recent abnormal CT scan, which showed a pink vaginal mass, highly suspicious for pancreatic cancer.  She has been haivng epigastric pain after meal for 4-5 months. She also reports left side pain at night when she sleeps on left side, mild back pain, the pain is worse lately, lasts longer especially afte rdinner, it about 5/10, appetite is lower than before, she lost aobut 10-20lbs in the past 5 months. No other complains, she had loose BM 1-2 time BM for 8-10 months, no hematochezia or melena. She was evaluated by her primary care physician Dr. Rex Kras, abdominal ultrasound was negative on 05/04/2015. She underwent a CT abdomen and pelvis with contrast on 05/16/2015, which showed a 4 x 1 cm mass in pancreatic body and a neck, tumor encases the distal celiac and proximal branches and occludes the main portal vein. No distant metastasis on the CT scan. She was referred to [place] for further workup.  She has good energy level, she is able to do all house work. She has intermittent dizziness from blood pressure meds. No other complains.   CURRENT THERAPY: supportive care   INTERIM HISTORY:  Nicole Sherman returns for follow up as scheduled. She presents to the clinic today with her husband and daughter post stroke. She was admitted to hospital on 12/11/2016 for stroke. She underwent physical therapy at home, and has recovered well. She has mild residual weakness on right side. She walks with a cane, she is able to do her ADLs. She reports she has developed worsening epigastric pain, which radiates to her back. It's intermittent, sometimes pain level get 8/10. She has been taking hydrocodone twice a day, but if her pain is not adequately controlled. Her appetite is decent, moderate nausea, no vomiting, bowel movements normal. No other new complaints.   MEDICAL HISTORY:  Past Medical History:  Diagnosis Date  . Arthritis    osteoarthritis-hands wrist  .  Cancer ([place])    pancreatic mass- dx. adenocarcinoma" CT abdomen 05-16-15  . Diabetes mellitus without complication ([place])   . Diverticulosis    showing on CT of abdomen 05-16-15  . Hypertension   . Hypothyroidism     SURGICAL HISTORY: Past Surgical History:  Procedure Laterality Date  . ANKLE SURGERY Right   . APPENDECTOMY     open  . CATARACT EXTRACTION Right   . EUS N/A 05/28/2015   Procedure: UPPER ENDOSCOPIC ULTRASOUND (EUS) LINEAR;  Surgeon: Arta Silence, MD;  Location: [facility] ENDOSCOPY;  Service: Endoscopy;  Laterality: N/A;  . EYE SURGERY     macular hole surgery repair  . JOINT REPLACEMENT Right   . SHOULDER ARTHROSCOPY W/ ROTATOR CUFF REPAIR Left   . SHOULDER OPEN ROTATOR CUFF REPAIR Right   . TUBAL LIGATION      SOCIAL HISTORY: Social History   Social History  . Marital Status: Married    Spouse Name: N/A  . Number of Children: 2 daughters   . Years of Education: N/A   Occupational History  . Not on file.   Social History Main Topics  . Smoking status: Former Smoker -- 1.00 packs/day for 30 years    Types: Cigarettes    Quit date: 05/21/1986  . Smokeless tobacco: Not on file  .  Alcohol Use: No  . Drug Use: No  . Sexual Activity: Not on file   Other Topics Concern  . Not on file   Social History Narrative    FAMILY HISTORY: Family History  Problem Relation Age of Onset  . Stroke Mother   . Cancer Sister        lung cancer     ALLERGIES:  is allergic to keflex [cephalexin] and aspirin.  MEDICATIONS:  Current Outpatient Prescriptions  Medication Sig Dispense Refill  . atorvastatin (LIPITOR) 40 MG tablet Take 1 tablet (40 mg total) by mouth daily at 6 PM. 30 tablet 0  . docusate sodium (COLACE) 100 MG capsule Take 100 mg by mouth daily as needed for moderate constipation.     Marland Kitchen HYDROcodone-acetaminophen (NORCO) 7.5-325 MG tablet Take 1 tablet by mouth every 6 (six) hours as needed for moderate pain. 120 tablet 0  . metFORMIN (GLUCOPHAGE) 1000 MG  tablet     . mupirocin ointment (BACTROBAN) 2 %     . ondansetron (ZOFRAN) 8 MG tablet Take 1 tablet (8 mg total) by mouth 2 (two) times daily. 30 tablet 2  . prochlorperazine (COMPAZINE) 5 MG tablet Take 1-2 tablets (5-10 mg total) by mouth every 6 (six) hours as needed for nausea or vomiting. 30 tablet 2   No current facility-administered medications for this visit.     REVIEW OF SYSTEMS:  Constitutional: Denies fevers, chills or abnormal night sweats. (+) Fatigue (+) lower energy Eyes: Denies blurriness of vision, double vision or watery eyes Ears, nose, mouth, throat, and face: Denies mucositis or sore throat Respiratory: Denies cough, dyspnea or wheezes Cardiovascular: Denies palpitation, chest discomfort or lower extremity swelling Gastrointestinal:  Denies heartburn or change in bowel habits (+) constipation (+) epigastric pain, nausea (+) occasional diarrhea Skin: Denies abnormal skin rashes. Lymphatics: Denies new lymphadenopathy or easy bruising Neurological:Denies numbness, tingling or new weaknesses Behavioral/Psych: Mood is stable, no new changes  All other systems were reviewed with the patient and are negative.  PHYSICAL EXAMINATION: ECOG PERFORMANCE STATUS: 2  Vitals:   01/23/17 1300  BP: (!) 106/55  Pulse: 69  Resp: 18  Temp: 98.2 F (36.8 C)  SpO2: 97%   Filed Weights   01/23/17 1300  Weight: 159 lb 6.4 oz (72.3 kg)     GENERAL:alert, oriented, appears to be pale  SKIN: skin color, texture, turgor are normal EYES: normal, conjunctiva are pink and non-injected, sclera clear OROPHARYNX:no exudate, no erythema and lips, buccal mucosa, and tongue normal  NECK: supple, thyroid normal size, non-tender, without nodularity LYMPH:  no palpable lymphadenopathy in the cervical, axillary or inguinal LUNGS: clear to auscultation and percussion with normal breathing effort HEART: regular rate & rhythm and no murmurs and no lower extremity edema ABDOMEN:abdomen soft,  non-tender and normal bowel sounds (+) bowel is slow Musculoskeletal:no cyanosis of digits and no clubbing  PSYCH: alert & oriented x 3 with fluent speech NEURO: no focal motor/sensory deficits  LABORATORY DATA:  I have reviewed the data as listed  CBC Latest Ref Rng & Units 01/23/2017 12/12/2016 12/11/2016  WBC 3.9 - 10.3 10e3/uL 4.3 - -  Hemoglobin 11.6 - 15.9 g/dL 10.0(L) 11.2(L) 11.6(L)  Hematocrit 34.8 - 46.6 % 31.1(L) 33.0(L) 34.0(L)  Platelets 145 - 400 10e3/uL 149 - -   CMP Latest Ref Rng & Units 01/23/2017 12/12/2016 12/11/2016  Glucose 70 - 140 mg/dl 234(H) 215(H) 249(H)  BUN 7.0 - 26.0 mg/dL 19.7 19 20   Creatinine 0.6 - 1.1 mg/dL  1.1 0.90 0.90  Sodium 136 - 145 mEq/L 138 138 137  Potassium 3.5 - 5.1 mEq/L 3.9 4.0 3.9  Chloride 101 - 111 mmol/L - 102 103  CO2 22 - 29 mEq/L 23 - -  Calcium 8.4 - 10.4 mg/dL 9.1 - -  Total Protein 6.4 - 8.3 g/dL 6.9 - -  Total Bilirubin 0.20 - 1.20 mg/dL 0.45 - -  Alkaline Phos 40 - 150 U/L 75 - -  AST 5 - 34 U/L 18 - -  ALT 0 - 55 U/L 12 - -    CA19.9 u/ML 05/31/2015: 751 07/03/2015: 227 07/19/2015: 159 08/23/2015: 304 09/03/2015: 309 10/15/2015: 357 12/31/2015: 121 01/30/2016: 77 02/28/2016: 51 04/17/16: 39 05/21/2015: 39 06/18/16: 56 07/30/16: 96 08/20/16: 137 10/01/16: 279   PATHOLOGY REPORT  Diagnosis 05/28/2015 FINE NEEDLE ASPIRATION: NEEDLE ASPIRATION, PANCREAS BODY (SPECIMEN 1 OF 1 COLLECTED 05/28/15): WELL DIFFERENTIATED ADENOCARCINOMA.   RADIOGRAPHIC STUDIES: I have personally reviewed the radiological images as listed and agreed with the findings in the report. No results found.   Brain MRI 12/12/16 IMPRESSION: 1. Acute small to moderate LEFT ACA territory infarct with petechial hemorrhage. 2. Mild chronic small vessel ischemic disease. Scattered old lacunar infarcts. Acute findings discussed with and reconfirmed by Judson Roch, charge nurse in [facility] ED on 12/12/2016 at 12:20 am.  CT A/P 07/04/16 IMPRESSION: 1. No  appreciable change in infiltrative hypoenhancing pancreatic body malignancy, with stable vascular involvement as detailed. 2. No evidence of metastatic disease in the abdomen or pelvis. 3. Stable mildly dilated central intrahepatic bile ducts and common bile duct. 4. Small volume free fluid in the deep pelvis. 5. Additional findings include aortic atherosclerosis, stable ectasia of the infrarenal abdominal aorta with maximum diameter 2.5 cm, decreased nonspecific mild wall thickening in the gastric antrum possibly representing evolving postradiation change, and diffuse colonic diverticulosis.   ASSESSMENT & PLAN:  83 y.o.Caucasian female, presented with intermittent abdominal pain, weight loss, and a CT finding of a pancreatic mass.  1. Pancreatic body/head adenocarcinoma, well differentiated, cT2N1M0, stage IIB, unresectable -I previously reviewed her CT chest results, which was negative for metastatic disease. -The biopsy results was previously reviewed with her and her family members in detail. -Her case was previously discussed in our GI tumor Board, Dr. Barry Dienes felt this is not resectable disease. -I previously reviewed the nature history of pancreatic cancer, which is aggressive. Her disease is incurable at this stage, and the goal of therapy is palliative and prolong her life. -she tolerated first line chemo gemcitabine poorly, with diarrhea, poor appetite, and skin rash, and was switched to second line Xeloda, which was stopped due to disease progression. - I previously reviewed her restaging CT scan from 10/29/15, which unfortunately showed disease progression in pancreas, no other metastasis.  -She has completed radiation. CA19.9 has decreased significantly after radiation, indicating good response. -I have requested MSI test on her tumor tissue to see if she is a candidate for immunotherapy, unfortunately there was not enough tissue for the test. -I previously reviewed her restaging  CT scan from 02/28/2016, which showed decreased size of the pancreatic mass, no liver or other metastasis. -she has had recurrent GI bleeding, spontaneously resolved, and did not require any intervention. -She is little reluctant to repeat colonoscopy or EGD. -We previously reviewed her restaging CT scan from 07/04/2016, which showed a stable pancreatic mass, no metastasis. -She previously has declined hospice. She would like to follow up with me in the clinic. - continue supportive care. She has  had worsening epigastric pain, likely secondary to her pancreatic cancer, I encouraged her to increase Norco to very 4-6 hours, I refilled for her  -I also called in compazine for her today for nausea  -We also discussed the role of surgical block if her pain is not well controlled by medication -We'll consider repeating scan if she develops other new symptoms of metastatic disease, such as pain, to see if she would benefit from palliative radiation.  2. Anemia -She has intermittent worsening anemia, from GI bleeding, iron deficiency. -Her anemia has improved after IV Feraheme on 05/26/16 and 06/02/2016 -Ferritin on 07/02/16 was 238, which was a significant improvement following IV Faraheme. -she has not been taking an iron pill, but says she will start taking it again. I advised her she could take a multivitamin, still soft and with iron to avoid constipation. -We will continue to monitor and give IV iron as needed. -Anemia is mild and stable, overall improved previously -She is taking iron pill and anemia continues to improve.    3. Left LE DVT -Probably provoked by her underlying malignancy and chemotherapy -I previously recommended anticoagulation indefinitely, giving her incurable malignancy, if no contraindications such as bleeding. -She has had recurrent GI bleeding, Xarelto has been stopped   4. Fatigue, anorexia, and abdominal pain -improved some, she'll continue tramadol and Vicodin as  needed. -I previously encouraged the patient to nap as needed, but then to remain active to improve her energy levels. -I previously encouraged the patient to eat smaller, more frequent meals to avoid gastric pain. -I recommend her to take Norco more frequent    5.HTN, DM, hypothyroidism  -follow up with PCP   6. Depression  -We previously discussed the patient feeling depressed by many factors; the cold weather and her cancer diagnosis. -We previously discussed she is doing well from her cancer standpoint. I encouraged her to be positive.  -We previously discussed antidepressant medication, she has respectfully declined  -she has very good social support   7. Goal of care  -We previously discussed her goal of care is palliative, giving the unresectable pancreatic cancer -Due to her advanced age and poor tolerance to chemotherapy, we'll continue palliative care alone. -We previously discussed hospice, she is not ready for it.   8. Constipation -She has ongoing constipation, worse lately. So has tried stool softener and laxatives -this occurs with cramps, nausea and occasional diarrhea -I suggest using Miralax  to use as it is mild and should not cause diarrhea. She can also try [place].   9. Stroke  -admitted for stroke on 12/11/16-12/12/16 -recovered well, with mild right-sided residual weakness -On aspirin   Plan  -refill hydrocodone, increased to every 6 hours as needed   -called in compazine for nausea -lab and f/u in 5 weeks, she knows to call me if pain is not controlled with Norco or other new symptoms    All questions were answered. The patient knows to call the clinic with any problems, questions or concerns.  I spent 20 minutes counseling the patient face to face. The total time spent in the appointment was 30 minutes and more than 50% was on counseling.   [name], MD 01/23/2017

## 2017-01-23 ENCOUNTER — Ambulatory Visit (HOSPITAL_BASED_OUTPATIENT_CLINIC_OR_DEPARTMENT_OTHER): Payer: Medicare Other | Admitting: Hematology

## 2017-01-23 ENCOUNTER — Telehealth: Payer: Self-pay | Admitting: Hematology

## 2017-01-23 ENCOUNTER — Other Ambulatory Visit (HOSPITAL_BASED_OUTPATIENT_CLINIC_OR_DEPARTMENT_OTHER): Payer: Medicare Other

## 2017-01-23 ENCOUNTER — Encounter: Payer: Self-pay | Admitting: Hematology

## 2017-01-23 VITALS — BP 106/55 | HR 69 | Temp 98.2°F | Resp 18 | Ht 66.5 in | Wt 159.4 lb

## 2017-01-23 DIAGNOSIS — I10 Essential (primary) hypertension: Secondary | ICD-10-CM

## 2017-01-23 DIAGNOSIS — Z86718 Personal history of other venous thrombosis and embolism: Secondary | ICD-10-CM | POA: Diagnosis not present

## 2017-01-23 DIAGNOSIS — E119 Type 2 diabetes mellitus without complications: Secondary | ICD-10-CM

## 2017-01-23 DIAGNOSIS — C257 Malignant neoplasm of other parts of pancreas: Secondary | ICD-10-CM

## 2017-01-23 DIAGNOSIS — E039 Hypothyroidism, unspecified: Secondary | ICD-10-CM

## 2017-01-23 DIAGNOSIS — D500 Iron deficiency anemia secondary to blood loss (chronic): Secondary | ICD-10-CM

## 2017-01-23 DIAGNOSIS — K922 Gastrointestinal hemorrhage, unspecified: Secondary | ICD-10-CM

## 2017-01-23 DIAGNOSIS — D630 Anemia in neoplastic disease: Secondary | ICD-10-CM

## 2017-01-23 DIAGNOSIS — C250 Malignant neoplasm of head of pancreas: Secondary | ICD-10-CM

## 2017-01-23 DIAGNOSIS — C251 Malignant neoplasm of body of pancreas: Secondary | ICD-10-CM

## 2017-01-23 LAB — COMPREHENSIVE METABOLIC PANEL
ALBUMIN: 3.5 g/dL (ref 3.5–5.0)
ALT: 12 U/L (ref 0–55)
AST: 18 U/L (ref 5–34)
Alkaline Phosphatase: 75 U/L (ref 40–150)
Anion Gap: 9 mEq/L (ref 3–11)
BILIRUBIN TOTAL: 0.45 mg/dL (ref 0.20–1.20)
BUN: 19.7 mg/dL (ref 7.0–26.0)
CALCIUM: 9.1 mg/dL (ref 8.4–10.4)
CO2: 23 mEq/L (ref 22–29)
Chloride: 106 mEq/L (ref 98–109)
Creatinine: 1.1 mg/dL (ref 0.6–1.1)
EGFR: 48 mL/min/{1.73_m2} — AB (ref >90)
Glucose: 234 mg/dl — ABNORMAL HIGH (ref 70–140)
POTASSIUM: 3.9 meq/L (ref 3.5–5.1)
Sodium: 138 mEq/L (ref 136–145)
Total Protein: 6.9 g/dL (ref 6.4–8.3)

## 2017-01-23 LAB — CBC WITH DIFFERENTIAL/PLATELET
BASO%: 0.5 % (ref 0.0–2.0)
BASOS ABS: 0 10*3/uL (ref 0.0–0.1)
EOS ABS: 0.1 10*3/uL (ref 0.0–0.5)
EOS%: 2.6 % (ref 0.0–7.0)
HEMATOCRIT: 31.1 % — AB (ref 34.8–46.6)
HGB: 10 g/dL — ABNORMAL LOW (ref 11.6–15.9)
LYMPH#: 0.6 10*3/uL — AB (ref 0.9–3.3)
LYMPH%: 14.5 % (ref 14.0–49.7)
MCH: 31.9 pg (ref 25.1–34.0)
MCHC: 32.2 g/dL (ref 31.5–36.0)
MCV: 99.4 fL (ref 79.5–101.0)
MONO#: 0.5 10*3/uL (ref 0.1–0.9)
MONO%: 11 % (ref 0.0–14.0)
NEUT#: 3.1 10*3/uL (ref 1.5–6.5)
NEUT%: 71.4 % (ref 38.4–76.8)
PLATELETS: 149 10*3/uL (ref 145–400)
RBC: 3.13 10*6/uL — ABNORMAL LOW (ref 3.70–5.45)
RDW: 14.3 % (ref 11.2–14.5)
WBC: 4.3 10*3/uL (ref 3.9–10.3)

## 2017-01-23 LAB — FERRITIN: FERRITIN: 83 ng/mL (ref 9–269)

## 2017-01-23 MED ORDER — HYDROCODONE-ACETAMINOPHEN 7.5-325 MG PO TABS: 1.0000 | ORAL_TABLET | Freq: Four times a day (QID) | ORAL | 0 refills | Status: DC | PRN

## 2017-01-23 MED ORDER — PROCHLORPERAZINE MALEATE 5 MG PO TABS: 5.0000 mg | ORAL_TABLET | Freq: Four times a day (QID) | ORAL | 2 refills | Status: DC | PRN

## 2017-01-23 NOTE — Telephone Encounter (Signed)
Gave avs and calendar for October  °

## 2017-01-24 ENCOUNTER — Encounter: Payer: Self-pay | Admitting: Hematology

## 2017-01-26 ENCOUNTER — Telehealth: Payer: Self-pay | Admitting: *Deleted

## 2017-01-26 NOTE — Telephone Encounter (Signed)
Pt requested ok from Dr Burr Medico to get the Flu shot and Shingles shot.  Dr Burr Medico gave permission to get flu shot but wanted her to hold off on the shingles shot pending  Information from her PCP.  Pt stated that her last shingles shot was given by her PCP more than two years ago.  She will check with her PCP about getting the shingles shot now.

## 2017-02-11 MED FILL — HYDROCODON-APAP 7.5-325: 7.5-325 | 30 days supply | Qty: 120 | Fill #0

## 2017-02-16 ENCOUNTER — Telehealth: Payer: Self-pay

## 2017-02-16 NOTE — Telephone Encounter (Signed)
Pt is thinking she may have a bladder infection. Husband called daughter who called [facility]. She does have a PCP Dr Rex Kras. Dr Burr Medico is out of office. Pt is not under active chemo tx. Instructed daughter to call Dr Rex Kras, if cannot get in to see his office to call back. Did chart search and found last time bladder infection in January she was first rx cipro that did not work then changed to bactrim - gave this information to daughter.

## 2017-02-16 NOTE — Telephone Encounter (Signed)
S/w husband that she did get an antibiotic rx today of cipro. She has also not had a BM in several days. She did take miralax this AM. Husband also got an enema. Instructed him to give enema tonight. If some results do not give miralax in AM.

## 2017-02-26 ENCOUNTER — Ambulatory Visit (HOSPITAL_BASED_OUTPATIENT_CLINIC_OR_DEPARTMENT_OTHER): Payer: Medicare Other | Admitting: Hematology

## 2017-02-26 ENCOUNTER — Other Ambulatory Visit (HOSPITAL_BASED_OUTPATIENT_CLINIC_OR_DEPARTMENT_OTHER): Payer: Medicare Other

## 2017-02-26 ENCOUNTER — Ambulatory Visit (HOSPITAL_COMMUNITY)
Admission: RE | Admit: 2017-02-26 | Discharge: 2017-02-26 | Disposition: A | Discharge status: Home / Self Care | Payer: Medicare Other | Source: Ambulatory Visit | Attending: Hematology | Admitting: Hematology

## 2017-02-26 ENCOUNTER — Telehealth: Payer: Self-pay | Admitting: Hematology

## 2017-02-26 DIAGNOSIS — C257 Malignant neoplasm of other parts of pancreas: Secondary | ICD-10-CM | POA: Diagnosis not present

## 2017-02-26 DIAGNOSIS — Z86718 Personal history of other venous thrombosis and embolism: Secondary | ICD-10-CM

## 2017-02-26 DIAGNOSIS — D649 Anemia, unspecified: Secondary | ICD-10-CM

## 2017-02-26 DIAGNOSIS — E039 Hypothyroidism, unspecified: Secondary | ICD-10-CM | POA: Diagnosis not present

## 2017-02-26 DIAGNOSIS — C250 Malignant neoplasm of head of pancreas: Secondary | ICD-10-CM

## 2017-02-26 DIAGNOSIS — D630 Anemia in neoplastic disease: Secondary | ICD-10-CM

## 2017-02-26 LAB — COMPREHENSIVE METABOLIC PANEL
ALT: 6 U/L (ref 0–55)
ANION GAP: 10 meq/L (ref 3–11)
AST: 15 U/L (ref 5–34)
Albumin: 3.4 g/dL — ABNORMAL LOW (ref 3.5–5.0)
Alkaline Phosphatase: 67 U/L (ref 40–150)
BILIRUBIN TOTAL: 0.47 mg/dL (ref 0.20–1.20)
BUN: 16.4 mg/dL (ref 7.0–26.0)
CALCIUM: 9.2 mg/dL (ref 8.4–10.4)
CO2: 25 meq/L (ref 22–29)
CREATININE: 1 mg/dL (ref 0.6–1.1)
Chloride: 104 mEq/L (ref 98–109)
EGFR: 54 mL/min/{1.73_m2} — AB (ref >60)
Glucose: 169 mg/dl — ABNORMAL HIGH (ref 70–140)
Potassium: 4.4 mEq/L (ref 3.5–5.1)
SODIUM: 139 meq/L (ref 136–145)
TOTAL PROTEIN: 6.9 g/dL (ref 6.4–8.3)

## 2017-02-26 LAB — CBC WITH DIFFERENTIAL/PLATELET
BASO%: 0.7 % (ref 0.0–2.0)
Basophils Absolute: 0 10*3/uL (ref 0.0–0.1)
EOS%: 1.6 % (ref 0.0–7.0)
Eosinophils Absolute: 0.1 10*3/uL (ref 0.0–0.5)
HCT: 31.9 % — ABNORMAL LOW (ref 34.8–46.6)
HEMOGLOBIN: 10.3 g/dL — AB (ref 11.6–15.9)
LYMPH%: 10.2 % — AB (ref 14.0–49.7)
MCH: 32 pg (ref 25.1–34.0)
MCHC: 32.4 g/dL (ref 31.5–36.0)
MCV: 98.9 fL (ref 79.5–101.0)
MONO#: 0.4 10*3/uL (ref 0.1–0.9)
MONO%: 7.9 % (ref 0.0–14.0)
NEUT#: 3.8 10*3/uL (ref 1.5–6.5)
NEUT%: 79.6 % — AB (ref 38.4–76.8)
Platelets: 190 10*3/uL (ref 145–400)
RBC: 3.23 10*6/uL — AB (ref 3.70–5.45)
RDW: 14.7 % — AB (ref 11.2–14.5)
WBC: 4.7 10*3/uL (ref 3.9–10.3)
lymph#: 0.5 10*3/uL — ABNORMAL LOW (ref 0.9–3.3)

## 2017-02-26 LAB — FERRITIN: FERRITIN: 78 ng/mL (ref 9–269)

## 2017-02-26 MED ORDER — LACTULOSE 20 G PO PACK: 20.0000 g | PACK | Freq: Four times a day (QID) | ORAL | 1 refills | Status: DC | PRN

## 2017-02-26 MED ORDER — HYDROCODONE-ACETAMINOPHEN 7.5-325 MG PO TABS: 1.0000 | ORAL_TABLET | Freq: Four times a day (QID) | ORAL | 0 refills | Status: DC | PRN

## 2017-02-26 NOTE — Telephone Encounter (Signed)
Gave avs and calendar for November  °

## 2017-02-26 NOTE — Progress Notes (Signed)
[facility]  [phone] [phone]  Clinic Follow Up Note   Patient Care Team: Hulan Fess, MD as PCP - General (Family Medicine) [name], MD as Consulting Physician (Hematology) Arta Silence, MD as Consulting Physician (Gastroenterology) 02/26/2017  CHIEF COMPLAINTS:  Follow up unresectable pancreatic adenocarcinoma    OTHER RELATED ISSUES 1. Left LE DVT diagnosed on 06/09/2015, started on lovenox and bridged to coumadin, which was held on 10/22/2068 due to GI bleeding. Changed to Xarelto on 10/31/2015, held due to GI bleeding   Oncology History   Pancreatic cancer [facility])   Staging form: Pancreas, AJCC 7th Edition     Clinical stage from 05/27/2014: Stage IIB (T2, N1, M0) - Signed by [name], MD on 05/31/2015       Carcinoma of body of pancreas ([place])   05/16/2015 Imaging    CT chest, abdomen and pelvis with contrast showed a bulky mass at pancreatic neck/body, tumor encases the celiac and proximal branches and a cruise the main portal vein, peripancreatic adenopathy. no distant mets.       05/28/2015 Initial Biopsy    Pancreatic mass needle biopsy showed well differentiated adenocarcinoma      05/31/2015 Initial Diagnosis    Pancreatic cancer ([place])      05/31/2015 Tumor Marker    CA19.9 =375      06/05/2015 - 07/03/2015 Chemotherapy    weekly gemcitabine 1019m/m2, dose reduced to 8562mm2 on week 2 due to tolerance issue      08/27/2015 - 10/30/2015 Chemotherapy    xeloda 100070mid, 14 days on, 7 days off, stopped due to disease progression       11/22/2015 - 12/05/2015 Radiation Therapy    Radiation to her pancreatic cancer       02/28/2016 Imaging    CT c/a/p with contrast IMPRESSION: 1. Decreased size of an infiltrative pancreatic head and neck mass, consistent with adenocarcinoma. Improvement in biliary duct dilatation. 2. No evidence of metastatic disease. 3. Similar arterial and venous involvement with  secondary gastroepiploic collaterals and gastric varices. 4. Similar small volume abdominal pelvic ascites. 5.  No acute process or evidence of metastatic disease in the chest. 6.  Coronary artery atherosclerosis. Aortic atherosclerosis. 7. Suspect gastric antral wall thickening, accentuated by underdistention. This may relate to radiation induced gastritis.      07/04/2016 Imaging    CT Abdomen Pelvis W contrast IMPRESSION: 1. No appreciable change in infiltrative hypoenhancing pancreatic body malignancy, with stable vascular involvement as detailed. 2. No evidence of metastatic disease in the abdomen or pelvis. 3. Stable mildly dilated central intrahepatic bile ducts and common bile duct. 4. Small volume free fluid in the deep pelvis. 5. Additional findings include aortic atherosclerosis, stable ectasia of the infrarenal abdominal aorta with maximum diameter 2.5 cm, decreased nonspecific mild wall thickening in the gastric antrum possibly representing evolving postradiation change, and diffuse colonic diverticulosis.      07/04/2016 Imaging    CT A/P IMPRESSION: 1. No appreciable change in infiltrative hypoenhancing pancreatic body malignancy, with stable vascular involvement as detailed. 2. No evidence of metastatic disease in the abdomen or pelvis. 3. Stable mildly dilated central intrahepatic bile ducts and common bile duct. 4. Small volume free fluid in the deep pelvis. 5. Additional findings include aortic atherosclerosis, stable ectasia of the infrarenal abdominal aorta with maximum diameter 2.5 cm, decreased nonspecific mild wall thickening in the gastric antrum possibly representing evolving postradiation change, and diffuse colonic diverticulosis.      12/11/2016 -  12/12/2016 [place] Admission    Admit date: 12/11/16 Admission diagnosis: Stroke Additional comments: on aspirin and will continue with rehab and supportive care       HISTORY OF PRESENTING ILLNESS:   Nicole [place] 83 y.o. female is here because of her recent abnormal CT scan, which showed a pink vaginal mass, highly suspicious for pancreatic cancer.  She has been haivng epigastric pain after meal for 4-5 months. She also reports left side pain at night when she sleeps on left side, mild back pain, the pain is worse lately, lasts longer especially afte rdinner, it about 5/10, appetite is lower than before, she lost aobut 10-20lbs in the past 5 months. No other complains, she had loose BM 1-2 time BM for 8-10 months, no hematochezia or melena. She was evaluated by her primary care physician Dr. Rex Kras, abdominal ultrasound was negative on 05/04/2015. She underwent a CT abdomen and pelvis with contrast on 05/16/2015, which showed a 4 x 1 cm mass in pancreatic body and a neck, tumor encases the distal celiac and proximal branches and occludes the main portal vein. No distant metastasis on the CT scan. She was referred to [place] for further workup.  She has good energy level, she is able to do all house work. She has intermittent dizziness from blood pressure meds. No other complains.   CURRENT THERAPY: supportive care   INTERIM HISTORY:  Nic[facility] [place] returns for follow up as scheduled. She presents to the clinic today with her husband and daughter and family member.  She notes the UTI and bowel constipation she had. She has tried Miralax once daily, Colace and Doculax once daily. Last week she tried a fleet enema that helped. She has not tried [place] and has not tried magnesium citrate in a while. She does not remember when her last regular BM was. She she tries to go she can only get a little bit out and only give slight relief. Due to constipation she cut back on her eating.  She takes hydrocodone 4 times a day and this controls her pain mostly.  She is still taking Cipro for her Urinary infection.  She feels her abdomen is bloated and wonders if that is due to constipation. Her nausea is better  compared to before. She is not able to pass gas much.     MEDICAL HISTORY:  Past Medical History:  Diagnosis Date  . Arthritis    osteoarthritis-hands wrist  . Cancer ([place])    pancreatic mass- dx. adenocarcinoma" CT abdomen 05-16-15  . Diabetes mellitus without complication ([place])   . Diverticulosis    showing on CT of abdomen 05-16-15  . Hypertension   . Hypothyroidism     SURGICAL HISTORY: Past Surgical History:  Procedure Laterality Date  . ANKLE SURGERY Right   . APPENDECTOMY     open  . CATARACT EXTRACTION Right   . EUS N/A 05/28/2015   Procedure: UPPER ENDOSCOPIC ULTRASOUND (EUS) LINEAR;  Surgeon: Arta Silence, MD;  Location: [facility];  Service: Endoscopy;  Laterality: N/A;  . EYE SURGERY     macular hole surgery repair  . JOINT REPLACEMENT Right   . SHOULDER ARTHROSCOPY W/ ROTATOR CUFF REPAIR Left   . SHOULDER OPEN ROTATOR CUFF REPAIR Right   . TUBAL LIGATION      SOCIAL HISTORY: Social History   Social History  . Marital Status: Married    Spouse Name: N/A  . Number of Children: 2 daughters   . Years of Education:  N/A   Occupational History  . Not on file.   Social History Main Topics  . Smoking status: Former Smoker -- 1.00 packs/day for 30 years    Types: Cigarettes    Quit date: 05/21/1986  . Smokeless tobacco: Not on file  . Alcohol Use: No  . Drug Use: No  . Sexual Activity: Not on file   Other Topics Concern  . Not on file   Social History Narrative    FAMILY HISTORY: Family History  Problem Relation Age of Onset  . Stroke Mother   . Cancer Sister        lung cancer     ALLERGIES:  is allergic to keflex [cephalexin] and aspirin.  MEDICATIONS:  Current Outpatient Prescriptions  Medication Sig Dispense Refill  . atorvastatin (LIPITOR) 40 MG tablet Take 1 tablet (40 mg total) by mouth daily at 6 PM. 30 tablet 0  . docusate sodium (COLACE) 100 MG capsule Take 100 mg by mouth daily as needed for moderate constipation.     Marland Kitchen  HYDROcodone-acetaminophen (NORCO) 7.5-325 MG tablet Take 1 tablet by mouth every 6 (six) hours as needed for moderate pain. 120 tablet 0  . lactulose (CEPHULAC) 20 g packet Take 1 packet (20 g total) by mouth every 6 (six) hours as needed. 30 each 1  . metFORMIN (GLUCOPHAGE) 1000 MG tablet     . mupirocin ointment (BACTROBAN) 2 %     . ondansetron (ZOFRAN) 8 MG tablet Take 1 tablet (8 mg total) by mouth 2 (two) times daily. 30 tablet 2  . prochlorperazine (COMPAZINE) 5 MG tablet Take 1-2 tablets (5-10 mg total) by mouth every 6 (six) hours as needed for nausea or vomiting. 30 tablet 2   No current facility-administered medications for this visit.     REVIEW OF SYSTEMS:  Constitutional: Denies fevers, chills or abnormal night sweats. (+) Fatigue (+) low appetite Eyes: Denies blurriness of vision, double vision or watery eyes Ears, nose, mouth, throat, and face: Denies mucositis or sore throat Respiratory: Denies cough, dyspnea or wheezes Cardiovascular: Denies palpitation, chest discomfort or lower extremity swelling Gastrointestinal:  Denies heartburn (+) constipation (+) bloating (+) occasional diarrhea (+) some nausea Skin: Denies abnormal skin rashes. Lymphatics: Denies new lymphadenopathy or easy bruising Neurological:Denies numbness, tingling or new weaknesses Behavioral/Psych: Mood is stable, no new changes  All other systems were reviewed with the patient and are negative.  PHYSICAL EXAMINATION: ECOG PERFORMANCE STATUS: 2  Vitals:   02/26/17 1326  BP: 120/63  Pulse: 82  Resp: 18  Temp: 98.5 F (36.9 C)  SpO2: 93%   Filed Weights   02/26/17 1326  Weight: 163 lb (73.9 kg)     GENERAL:alert, oriented, appears to be pale  SKIN: skin color, texture, turgor are normal EYES: normal, conjunctiva are pink and non-injected, sclera clear OROPHARYNX:no exudate, no erythema and lips, buccal mucosa, and tongue normal  NECK: supple, thyroid normal size, non-tender, without  nodularity LYMPH:  no palpable lymphadenopathy in the cervical, axillary or inguinal LUNGS: clear to auscultation and percussion with normal breathing effort HEART: regular rate & rhythm and no murmurs and no lower extremity edema ABDOMEN:abdomen soft, non-tender and normal bowel sounds (+) slow moving bowel  Musculoskeletal:no cyanosis of digits and no clubbing  PSYCH: alert & oriented x 3 with fluent speech NEURO: no focal motor/sensory deficits  LABORATORY DATA:  I have reviewed the data as listed  CBC Latest Ref Rng & Units 02/26/2017 01/23/2017 12/12/2016  WBC 3.9 - 10.3  10e3/uL 4.7 4.3 -  Hemoglobin 11.6 - 15.9 g/dL 10.3(L) 10.0(L) 11.2(L)  Hematocrit 34.8 - 46.6 % 31.9(L) 31.1(L) 33.0(L)  Platelets 145 - 400 10e3/uL 190 149 -   CMP Latest Ref Rng & Units 02/26/2017 01/23/2017 12/12/2016  Glucose 70 - 140 mg/dl 169(H) 234(H) 215(H)  BUN 7.0 - 26.0 mg/dL 16.4 19.7 19  Creatinine 0.6 - 1.1 mg/dL 1.0 1.1 0.90  Sodium 136 - 145 mEq/L 139 138 138  Potassium 3.5 - 5.1 mEq/L 4.4 3.9 4.0  Chloride 101 - 111 mmol/L - - 102  CO2 22 - 29 mEq/L 25 23 -  Calcium 8.4 - 10.4 mg/dL 9.2 9.1 -  Total Protein 6.4 - 8.3 g/dL 6.9 6.9 -  Total Bilirubin 0.20 - 1.20 mg/dL 0.47 0.45 -  Alkaline Phos 40 - 150 U/L 67 75 -  AST 5 - 34 U/L 15 18 -  ALT 0 - 55 U/L 6 12 -    CA19.9 u/ML  05/31/2015: 751 07/03/2015: 227 07/19/2015: 159 08/23/2015: 304 09/03/2015: 309 10/15/2015: 357 12/31/2015: 121 01/30/2016: 77 02/28/2016: 51 04/17/16: 39 05/21/2015: 39 06/18/16: 56 07/30/16: 96 08/20/16: 137 10/01/16: 279   PATHOLOGY REPORT  Diagnosis 05/28/2015 FINE NEEDLE ASPIRATION: NEEDLE ASPIRATION, PANCREAS BODY (SPECIMEN 1 OF 1 COLLECTED 05/28/15): WELL DIFFERENTIATED ADENOCARCINOMA.   RADIOGRAPHIC STUDIES: I have personally reviewed the radiological images as listed and agreed with the findings in the report. Dg Abd 2 Views  Result Date: 02/26/2017 CLINICAL DATA:  Constipation. Nausea and vomiting.  Clinical concern for bowel obstruction. Pancreatic cancer. EXAM: ABDOMEN - 2 VIEW COMPARISON:  11/12/2016. FINDINGS: Normal bowel gas pattern without free peritoneal air. There are scattered air-fluid levels in normal caliber bowel loops. Prominent stool in the right and left colon. Lumbar and lower thoracic spine degenerative changes. IMPRESSION: 1. Scattered air-fluid levels in normal caliber bowel loops. This can be seen with gastroenteritis. 2. Prominent stool in the right and left colon. Electronically Signed   By: Claudie Revering M.D.   On: 02/26/2017 14:29     Brain MRI 12/12/16 IMPRESSION: 1. Acute small to moderate LEFT ACA territory infarct with petechial hemorrhage. 2. Mild chronic small vessel ischemic disease. Scattered old lacunar infarcts. Acute findings discussed with and reconfirmed by Judson Roch, charge nurse in [facility] ED on 12/12/2016 at 12:20 am.  CT A/P 07/04/16 IMPRESSION: 1. No appreciable change in infiltrative hypoenhancing pancreatic body malignancy, with stable vascular involvement as detailed. 2. No evidence of metastatic disease in the abdomen or pelvis. 3. Stable mildly dilated central intrahepatic bile ducts and common bile duct. 4. Small volume free fluid in the deep pelvis. 5. Additional findings include aortic atherosclerosis, stable ectasia of the infrarenal abdominal aorta with maximum diameter 2.5 cm, decreased nonspecific mild wall thickening in the gastric antrum possibly representing evolving postradiation change, and diffuse colonic diverticulosis.   ASSESSMENT & PLAN:  83 y.o.Caucasian female, presented with intermittent abdominal pain, weight loss, and a CT finding of a pancreatic mass.  1. Pancreatic body/head adenocarcinoma, well differentiated, cT2N1M0, stage IIB, unresectable -I previously reviewed her CT chest results, which was negative for metastatic disease. -The biopsy results was previously reviewed with her and her family members in  detail. -Her case was previously discussed in our GI tumor Board, Dr. Barry Dienes felt this is not resectable disease. -I previously reviewed the nature history of pancreatic cancer, which is aggressive. Her disease is incurable at this stage, and the goal of therapy is palliative and prolong her life. -she tolerated first line  chemo gemcitabine poorly, with diarrhea, poor appetite, and skin rash, and was switched to second line Xeloda, which was stopped due to disease progression. - I previously reviewed her restaging CT scan from 10/29/15, which unfortunately showed disease progression in pancreas, no other metastasis.  -She has completed radiation. CA19.9 has decreased significantly after radiation, indicating good response. -I have requested MSI test on her tumor tissue to see if she is a candidate for immunotherapy, unfortunately there was not enough tissue for the test. -I previously reviewed her restaging CT scan from 02/28/2016, which showed decreased size of the pancreatic mass, no liver or other metastasis. -she has had recurrent GI bleeding, spontaneously resolved, and did not require any intervention. -She is little reluctant to repeat colonoscopy or EGD. -We previously reviewed her restaging CT scan from 07/04/2016, which showed a stable pancreatic mass, no metastasis. -She previously has declined hospice. She would like to follow up with me in the clinic. - continue supportive care. She has had worsening epigastric pain, likely secondary to her pancreatic cancer, I encouraged her to increase Norco to very 4-6 hours -We also previously discussed the role of surgical block if her pain is not well controlled by medication -We'll consider repeating scan only if she develops other new symptoms of metastatic disease, such as pain, to see if she would benefit from palliative radiation. -Labs reviewed today, overall normal with mild anemia. -Due to her severe constipation, and bloating we will do a  Xray today to rule out SBO.    2. Anemia -She has intermittent worsening anemia, from GI bleeding, iron deficiency. -Her anemia has improved after IV Feraheme on 05/26/16 and 06/02/2016 -Ferritin on 07/02/16 was 238, which was a significant improvement following IV Faraheme. -she has not been taking an iron pill, but says she will start taking it again. I advised her she could take a multivitamin, still soft and with iron to avoid constipation. -We will continue to monitor and give IV iron as needed. -Anemia is mild and stable, overall improved previously -She is taking iron pill and anemia remains stable.   3. Left LE DVT -Probably provoked by her underlying malignancy and chemotherapy -I previously recommended anticoagulation indefinitely, giving her incurable malignancy, if no contraindications such as bleeding. -She has had recurrent GI bleeding, Xarelto has been stopped   4. Fatigue, anorexia, and abdominal pain -improved some, she'll continue tramadol and Vicodin as needed. -I previously encouraged the patient to nap as needed, but then to remain active to improve her energy levels. -I previously encouraged the patient to eat smaller, more frequent meals to avoid gastric pain. -I recommend her to take Norco more frequent, she takes it 4 times a day and controls her pain mostly. -The bloating in her stomach is related to her constipation.  -I suspect once we can control her constipation she can eat better.    5.HTN, DM, hypothyroidism  -follow up with PCP   6. Depression  -We previously discussed the patient feeling depressed by many factors; the cold weather and her cancer diagnosis. -We previously discussed she is doing well from her cancer standpoint. I encouraged her to be positive.  -We previously discussed antidepressant medication, she has respectfully declined  -she has very good social support   7. Goal of care  -We previously discussed her goal of care is palliative,  giving the unresectable pancreatic cancer -Due to her advanced age and poor tolerance to chemotherapy, we'll continue palliative care alone. -We previously discussed hospice, she  is not ready for it.   8. Constipation -She has ongoing constipation, worse lately. So has tried stool softener and laxatives -this occurs with cramps, nausea and occasional diarrhea -I suggest using Miralax  to use as it is mild and should not cause diarrhea. She can also try [place].  -She has not been using enough medication to help her. I recommend she takes half bottle of magnesium citrate and if no BM then take the rest of bottle. Then for regular maintenance I recommend Miralax twice a day, 2 cups in the morning and if needed 1 cup in the evening. She needs to have at least 1 BM every 1-2 days. I will give prescription Lactulose to use as needed.  -She is fine to have fleet enema if needed.   9. Stroke  -admitted for stroke on 12/11/16-12/12/16 -recovered well, with mild right-sided residual weakness -On aspirin   Plan  -Prescibe Lactulose today, she will try magnesium citrate for constipation also -refill Hydrocodone  -Abdominal X-ray today to ruled out biopsy section -Lab and f/u 11/19 or 11/20     All questions were answered. The patient knows to call the clinic with any problems, questions or concerns.  I spent 20 minutes counseling the patient face to face. The total time spent in the appointment was 30 minutes and more than 50% was on counseling.   [name], MD 02/26/2017   This document serves as a record of services personally performed by [name], MD. It was created on her behalf by Joslyn Devon, a trained medical scribe. The creation of this record is based on the scribe's personal observations and the provider's statements to them. This document has been checked and approved by the attending provider.

## 2017-02-27 ENCOUNTER — Encounter: Payer: Self-pay | Admitting: Hematology

## 2017-02-27 ENCOUNTER — Telehealth: Payer: Self-pay

## 2017-02-27 ENCOUNTER — Other Ambulatory Visit: Payer: Self-pay

## 2017-02-27 MED ORDER — LACTULOSE 10 GM/15ML PO SOLN: 20.0000 g | Freq: Three times a day (TID) | ORAL | 0 refills | Status: DC | PRN

## 2017-02-27 MED ORDER — LACTULOSE 10 GM/15ML PO SOLN: 20.0000 g | Freq: Three times a day (TID) | ORAL | 0 refills | Status: DC

## 2017-02-27 NOTE — Progress Notes (Signed)
Received questionnaire from [employer] regarding Lactulose Rx.  Placed on physician desk to be completed and returned back to me.

## 2017-02-27 NOTE — Progress Notes (Signed)
Received PA request for [facility]  Called [facility]([place]) to initiate PA. Answered questions to the best of my knowledge.  PA sent for review. Determination will be sent via fax once review completed.

## 2017-02-27 NOTE — Telephone Encounter (Signed)
Called husband, wife still in bed. Per Dr. Burr Medico, abdominal xray shows constipation, no obstruction.  She has not had a BM. They will pick up Magnesium Citrate at pharmacy today. Instructed to call for any problems.

## 2017-03-01 ENCOUNTER — Encounter: Payer: Self-pay | Admitting: Hematology

## 2017-03-02 ENCOUNTER — Encounter: Payer: Self-pay | Admitting: Hematology

## 2017-03-02 NOTE — Progress Notes (Signed)
Received PA determination from [employer].  PA was denied. Left on physician's desk.

## 2017-03-23 ENCOUNTER — Ambulatory Visit: Payer: Medicare Other | Admitting: Nurse Practitioner

## 2017-03-23 ENCOUNTER — Encounter: Payer: Self-pay | Admitting: Nurse Practitioner

## 2017-03-23 ENCOUNTER — Other Ambulatory Visit (HOSPITAL_BASED_OUTPATIENT_CLINIC_OR_DEPARTMENT_OTHER): Payer: Medicare Other

## 2017-03-23 ENCOUNTER — Telehealth: Payer: Self-pay | Admitting: Hematology

## 2017-03-23 DIAGNOSIS — D630 Anemia in neoplastic disease: Secondary | ICD-10-CM

## 2017-03-23 DIAGNOSIS — C257 Malignant neoplasm of other parts of pancreas: Secondary | ICD-10-CM

## 2017-03-23 DIAGNOSIS — C251 Malignant neoplasm of body of pancreas: Secondary | ICD-10-CM

## 2017-03-23 DIAGNOSIS — C250 Malignant neoplasm of head of pancreas: Secondary | ICD-10-CM

## 2017-03-23 LAB — COMPREHENSIVE METABOLIC PANEL
ALT: 8 U/L (ref 0–55)
AST: 13 U/L (ref 5–34)
Albumin: 3.3 g/dL — ABNORMAL LOW (ref 3.5–5.0)
Alkaline Phosphatase: 70 U/L (ref 40–150)
Anion Gap: 9 mEq/L (ref 3–11)
BUN: 15.2 mg/dL (ref 7.0–26.0)
CALCIUM: 9 mg/dL (ref 8.4–10.4)
CHLORIDE: 106 meq/L (ref 98–109)
CO2: 24 mEq/L (ref 22–29)
CREATININE: 1 mg/dL (ref 0.6–1.1)
EGFR: 55 mL/min/{1.73_m2} — ABNORMAL LOW (ref >60)
GLUCOSE: 166 mg/dL — AB (ref 70–140)
Potassium: 4 mEq/L (ref 3.5–5.1)
SODIUM: 139 meq/L (ref 136–145)
Total Bilirubin: 0.53 mg/dL (ref 0.20–1.20)
Total Protein: 6.8 g/dL (ref 6.4–8.3)

## 2017-03-23 LAB — CBC WITH DIFFERENTIAL/PLATELET
BASO%: 1.2 % (ref 0.0–2.0)
Basophils Absolute: 0.1 10*3/uL (ref 0.0–0.1)
EOS%: 1.9 % (ref 0.0–7.0)
Eosinophils Absolute: 0.1 10*3/uL (ref 0.0–0.5)
HEMATOCRIT: 31.5 % — AB (ref 34.8–46.6)
HEMOGLOBIN: 10.3 g/dL — AB (ref 11.6–15.9)
LYMPH#: 0.7 10*3/uL — AB (ref 0.9–3.3)
LYMPH%: 16.3 % (ref 14.0–49.7)
MCH: 31.9 pg (ref 25.1–34.0)
MCHC: 32.7 g/dL (ref 31.5–36.0)
MCV: 97.7 fL (ref 79.5–101.0)
MONO#: 0.5 10*3/uL (ref 0.1–0.9)
MONO%: 10 % (ref 0.0–14.0)
NEUT#: 3.2 10*3/uL (ref 1.5–6.5)
NEUT%: 70.6 % (ref 38.4–76.8)
Platelets: 216 10*3/uL (ref 145–400)
RBC: 3.22 10*6/uL — ABNORMAL LOW (ref 3.70–5.45)
RDW: 14.8 % — AB (ref 11.2–14.5)
WBC: 4.6 10*3/uL (ref 3.9–10.3)

## 2017-03-23 LAB — FERRITIN: FERRITIN: 68 ng/mL (ref 9–269)

## 2017-03-23 MED ORDER — HYDROCODONE-ACETAMINOPHEN 7.5-325 MG PO TABS: 1.0000 | ORAL_TABLET | Freq: Four times a day (QID) | ORAL | 0 refills | Status: DC | PRN

## 2017-03-23 MED FILL — HYDROCODON-APAP 7.5-325: 7.5-325 | 30 days supply | Qty: 120 | Fill #0

## 2017-03-23 NOTE — Progress Notes (Addendum)
Nicole Sherman  [phone] [phone]  Clinic Follow up Note   Patient Care Team: Hulan Fess, MD as PCP - General (Family Medicine) [name], MD as Consulting Physician (Hematology) Arta Silence, MD as Consulting Physician (Gastroenterology) 03/23/2017  SUMMARY OF ONCOLOGIC HISTORY: Oncology History   Pancreatic cancer [facility])   Staging form: Pancreas, AJCC 7th Edition     Clinical stage from 05/27/2014: Stage IIB (T2, N1, M0) - Signed by [name], MD on 05/31/2015       Carcinoma of body of pancreas ([place])   05/16/2015 Imaging    CT chest, abdomen and pelvis with contrast showed a bulky mass at pancreatic neck/body, tumor encases the celiac and proximal branches and a cruise the main portal vein, peripancreatic adenopathy. no distant mets.       05/28/2015 Initial Biopsy    Pancreatic mass needle biopsy showed well differentiated adenocarcinoma      05/31/2015 Initial Diagnosis    Pancreatic cancer ([place])      05/31/2015 Tumor Marker    CA19.9 =375      06/05/2015 - 07/03/2015 Chemotherapy    weekly gemcitabine 1000mg /m2, dose reduced to 850mg /m2 on week 2 due to tolerance issue      08/27/2015 - 10/30/2015 Chemotherapy    xeloda 1000mg  bid, 14 days on, 7 days off, stopped due to disease progression       11/22/2015 - 12/05/2015 Radiation Therapy    Radiation to her pancreatic cancer       02/28/2016 Imaging    CT c/a/p with contrast IMPRESSION: 1. Decreased size of an infiltrative pancreatic head and neck mass, consistent with adenocarcinoma. Improvement in biliary duct dilatation. 2. No evidence of metastatic disease. 3. Similar arterial and venous involvement with secondary gastroepiploic collaterals and gastric varices. 4. Similar small volume abdominal pelvic ascites. 5.  No acute process or evidence of metastatic disease in the chest. 6.  Coronary artery atherosclerosis. Aortic atherosclerosis. 7. Suspect gastric antral  wall thickening, accentuated by underdistention. This may relate to radiation induced gastritis.      07/04/2016 Imaging    CT Abdomen Pelvis W contrast IMPRESSION: 1. No appreciable change in infiltrative hypoenhancing pancreatic body malignancy, with stable vascular involvement as detailed. 2. No evidence of metastatic disease in the abdomen or pelvis. 3. Stable mildly dilated central intrahepatic bile ducts and common bile duct. 4. Small volume free fluid in the deep pelvis. 5. Additional findings include aortic atherosclerosis, stable ectasia of the infrarenal abdominal aorta with maximum diameter 2.5 cm, decreased nonspecific mild wall thickening in the gastric antrum possibly representing evolving postradiation change, and diffuse colonic diverticulosis.      07/04/2016 Imaging    CT A/P IMPRESSION: 1. No appreciable change in infiltrative hypoenhancing pancreatic body malignancy, with stable vascular involvement as detailed. 2. No evidence of metastatic disease in the abdomen or pelvis. 3. Stable mildly dilated central intrahepatic bile ducts and common bile duct. 4. Small volume free fluid in the deep pelvis. 5. Additional findings include aortic atherosclerosis, stable ectasia of the infrarenal abdominal aorta with maximum diameter 2.5 cm, decreased nonspecific mild wall thickening in the gastric antrum possibly representing evolving postradiation change, and diffuse colonic diverticulosis.      12/11/2016 - 12/12/2016 Hospital Admission    Admit date: 12/11/16 Admission diagnosis: Stroke Additional comments: on aspirin and will continue with rehab and supportive care     CURRENT THERAPY: supportive care  INTERVAL HISTORY: Nicole Sherman returns today for follow-up with family as  scheduled.  She continues to have low appetite and mild fatigue.  She feels her scale in our scale are different, she weighs approximately 159 pounds at home.  Intermittent mild nausea controlled  well with as needed medication.  Feels she is managing her constipation with MiraLAX, often requiring a "double dose."  Last BM 2 days ago.  She has continued abdominal and back pain, 9/10 today, with Norco every 6 hours; she reports complete relief that lasts approximately 5 hours on current dose.  She continues to report abdominal bloating, stable overall.  REVIEW OF SYSTEMS:   Constitutional: Denies fevers, chills or abnormal weight loss (+) low appetite (+) mild fatigue Eyes: Denies blurriness of vision Ears, nose, mouth, throat, and face: Denies mucositis or sore throat Respiratory: Denies cough, dyspnea or wheezes Cardiovascular: Denies palpitation, chest discomfort or lower extremity swelling Gastrointestinal:  Denies diarrhea, emesis, heartburn or change in bowel habits (+) constipation, controlled with MiraLAX, last BM 2 days ago (+) ABD pain 9/10 radiates to back (+) bloating Skin: Denies abnormal skin rashes Lymphatics: Denies new lymphadenopathy or easy bruising Neurological:Denies numbness, tingling or new weaknesses Behavioral/Psych: Mood is stable, no new changes  All other systems were reviewed with the patient and are negative.  MEDICAL HISTORY:  Past Medical History:  Diagnosis Date  . Arthritis    osteoarthritis-hands wrist  . Cancer ([place])    pancreatic mass- dx. adenocarcinoma" CT abdomen 05-16-15  . Diabetes mellitus without complication ([place])   . Diverticulosis    showing on CT of abdomen 05-16-15  . Hypertension   . Hypothyroidism     SURGICAL HISTORY: Past Surgical History:  Procedure Laterality Date  . ANKLE SURGERY Right   . APPENDECTOMY     open  . CATARACT EXTRACTION Right   . EYE SURGERY     macular hole surgery repair  . JOINT REPLACEMENT Right   . SHOULDER ARTHROSCOPY W/ ROTATOR CUFF REPAIR Left   . SHOULDER OPEN ROTATOR CUFF REPAIR Right   . TUBAL LIGATION    . UPPER ENDOSCOPIC ULTRASOUND (EUS) LINEAR N/A 05/28/2015   Performed by Arta Silence, MD at [facility]    I have reviewed the social history and family history with the patient and they are unchanged from previous note.  ALLERGIES:  is allergic to keflex [cephalexin] and aspirin.  MEDICATIONS:  Current Outpatient Medications  Medication Sig Dispense Refill  . atorvastatin (LIPITOR) 40 MG tablet Take 1 tablet (40 mg total) by mouth daily at 6 PM. 30 tablet 0  . HYDROcodone-acetaminophen (NORCO) 7.5-325 MG tablet Take 1 tablet every 6 (six) hours as needed by mouth for moderate pain. 120 tablet 0  . metFORMIN (GLUCOPHAGE) 1000 MG tablet     . mupirocin ointment (BACTROBAN) 2 %     . ondansetron (ZOFRAN) 8 MG tablet Take 1 tablet (8 mg total) by mouth 2 (two) times daily. 30 tablet 2  . polyethylene glycol (MIRALAX / GLYCOLAX) packet Take 17 g daily by mouth.    . docusate sodium (COLACE) 100 MG capsule Take 100 mg by mouth daily as needed for moderate constipation.     . prochlorperazine (COMPAZINE) 5 MG tablet Take 1-2 tablets (5-10 mg total) by mouth every 6 (six) hours as needed for nausea or vomiting. (Patient not taking: Reported on 03/23/2017) 30 tablet 2   No current facility-administered medications for this visit.     PHYSICAL EXAMINATION: ECOG PERFORMANCE STATUS: 2 - Symptomatic, <50% confined to bed  Vitals:  03/23/17 1353  BP: 117/70  Pulse: 73  Resp: 18  Temp: 98.8 F (37.1 C)  SpO2: 93%   Filed Weights   03/23/17 1353  Weight: 163 lb 3.2 oz (74 kg)    GENERAL:alert, no distress and comfortable, in wheelchair able to get to exam table independently SKIN: skin color, texture, turgor are normal, no rashes or significant lesions EYES: normal, Conjunctiva are pink and non-injected, sclera clear OROPHARYNX:no exudate, no erythema and lips, buccal mucosa, and tongue normal  NECK: supple, thyroid normal size, non-tender, without nodularity LYMPH:  no palpable cervical or supraclavicular lymphadenopathy LUNGS: clear to auscultation laterally  with normal breathing effort HEART: regular rate & rhythm and no murmurs and no lower extremity edema ABDOMEN:abdomen firm, bloated non-tender and normal bowel sounds Musculoskeletal:no cyanosis of digits and no clubbing  NEURO: alert & oriented x 3 with fluent speech, no focal motor/sensory deficits  LABORATORY DATA:  I have reviewed the data as listed CBC Latest Ref Rng & Units 03/23/2017 02/26/2017 01/23/2017  WBC 3.9 - 10.3 10e3/uL 4.6 4.7 4.3  Hemoglobin 11.6 - 15.9 g/dL 10.3(L) 10.3(L) 10.0(L)  Hematocrit 34.8 - 46.6 % 31.5(L) 31.9(L) 31.1(L)  Platelets 145 - 400 10e3/uL 216 190 149     CMP Latest Ref Rng & Units 03/23/2017 02/26/2017 01/23/2017  Glucose 70 - 140 mg/dl 166(H) 169(H) 234(H)  BUN 7.0 - 26.0 mg/dL 15.2 16.4 19.7  Creatinine 0.6 - 1.1 mg/dL 1.0 1.0 1.1  Sodium 136 - 145 mEq/L 139 139 138  Potassium 3.5 - 5.1 mEq/L 4.0 4.4 3.9  Chloride 101 - 111 mmol/L - - -  CO2 22 - 29 mEq/L 24 25 23   Calcium 8.4 - 10.4 mg/dL 9.0 9.2 9.1  Total Protein 6.4 - 8.3 g/dL 6.8 6.9 6.9  Total Bilirubin 0.20 - 1.20 mg/dL 0.53 0.47 0.45  Alkaline Phos 40 - 150 U/L 70 67 75  AST 5 - 34 U/L 13 15 18   ALT 0 - 55 U/L 8 6 12    CA19.9 u/ML  05/31/2015: 751 07/03/2015: 227 07/19/2015: 159 08/23/2015: 304 09/03/2015: 309 10/15/2015: 357 12/31/2015: 121 01/30/2016: 77 02/28/2016: 51 04/17/16: 39 05/21/2015: 39 06/18/16: 56 07/30/16: 96 08/20/16: 137 10/01/16: 279   RADIOGRAPHIC STUDIES: I have personally reviewed the radiological images as listed and agreed with the findings in the report. No results found.   ASSESSMENT & PLAN: 83 y.o.Caucasian female, presented with intermittent abdominal pain, weight loss, and a CT finding of a pancreatic mass.  1. Pancreatic body/head adenocarcinoma, well differentiated, cT2N1M0, stage IIB, unresectable 2. Anemia 3. Left LE DVT 4. Fatigue, anorexia, ABD pain 5. HTN, DM, hypothyroidism 6. Depression 7. Goals of care 8. Constipation 9.  Stroke  Ms. Hauck appears stable today. Abdominal bloating is unchanged overall, no new symptoms. Current pain controlled with norco 7.5-325 mg q6 PRN; will continue. I refilled Rx today, DNF date 03/29/17 as patient currently has ample supply. Constipation managed with miralax PRN, occasionally requiring extra daily dose to keep BM every 1-2 days. We will continue symptom management. She is asymptomatic from abdominal distention, may consider CT or [place] if suspected ascites becomes progressive or symptomatic. She will return in 6 weeks for lab and f/u.   PLAN Continue current pain regimen, refilled norco 7.5-325mg  q6 PRN today miralax PRN to keep BM q1-2 days Return in 6 weeks for lab and f/u  All questions were answered. The patient knows to call the clinic with any problems, questions or concerns. No barriers to learning was  detected.     Nicole Feeling, NP 03/23/17   I have seen the patient, examined her. I agree with the assessment and and plan and have edited the notes.   Nicole Sherman is clinical stable overall, pain is controlled with medications, she has slight more abdominal bloating, constipation management reviewed again. Continue supportive care, will see her back in 6 weeks.  Most call us if she has worsening symptoms.  [name]  03/23/2017

## 2017-03-23 NOTE — Telephone Encounter (Signed)
Gave avs and calendar for December  °

## 2017-04-22 MED FILL — HYDROCODON-APAP 7.5-325: 7.5-325 | 30 days supply | Qty: 120 | Fill #0

## 2017-05-03 NOTE — Progress Notes (Signed)
[place]  [phone] [phone]  Clinic Follow Up Note   Patient Care Team: Hulan Fess, MD as PCP - General (Family Medicine) [name], MD as Consulting Physician (Hematology) Arta Silence, MD as Consulting Physician (Gastroenterology) 05/04/2017  CHIEF COMPLAINTS:  Follow up unresectable pancreatic adenocarcinoma    OTHER RELATED ISSUES 1. Left LE DVT diagnosed on 06/09/2015, started on lovenox and bridged to coumadin, which was held on 10/22/2068 due to GI bleeding. Changed to Xarelto on 10/31/2015, held due to GI bleeding   Oncology History   Pancreatic cancer [facility])   Staging form: Pancreas, AJCC 7th Edition     Clinical stage from 05/27/2014: Stage IIB (T2, N1, M0) - Signed by [name], MD on 05/31/2015       Carcinoma of body of pancreas ([place])   05/16/2015 Imaging    CT chest, abdomen and pelvis with contrast showed a bulky mass at pancreatic neck/body, tumor encases the celiac and proximal branches and a cruise the main portal vein, peripancreatic adenopathy. no distant mets.       05/28/2015 Initial Biopsy    Pancreatic mass needle biopsy showed well differentiated adenocarcinoma      05/31/2015 Initial Diagnosis    Pancreatic cancer ([place])      05/31/2015 Tumor Marker    CA19.9 =375      06/05/2015 - 07/03/2015 Chemotherapy    weekly gemcitabine 1061m/m2, dose reduced to 8543mm2 on week 2 due to tolerance issue      08/27/2015 - 10/30/2015 Chemotherapy    xeloda 100051mid, 14 days on, 7 days off, stopped due to disease progression       11/22/2015 - 12/05/2015 Radiation Therapy    Radiation to her pancreatic cancer       02/28/2016 Imaging    CT c/a/p with contrast IMPRESSION: 1. Decreased size of an infiltrative pancreatic head and neck mass, consistent with adenocarcinoma. Improvement in biliary duct dilatation. 2. No evidence of metastatic disease. 3. Similar arterial and venous involvement with  secondary gastroepiploic collaterals and gastric varices. 4. Similar small volume abdominal pelvic ascites. 5.  No acute process or evidence of metastatic disease in the chest. 6.  Coronary artery atherosclerosis. Aortic atherosclerosis. 7. Suspect gastric antral wall thickening, accentuated by underdistention. This may relate to radiation induced gastritis.      07/04/2016 Imaging    CT Abdomen Pelvis W contrast IMPRESSION: 1. No appreciable change in infiltrative hypoenhancing pancreatic body malignancy, with stable vascular involvement as detailed. 2. No evidence of metastatic disease in the abdomen or pelvis. 3. Stable mildly dilated central intrahepatic bile ducts and common bile duct. 4. Small volume free fluid in the deep pelvis. 5. Additional findings include aortic atherosclerosis, stable ectasia of the infrarenal abdominal aorta with maximum diameter 2.5 cm, decreased nonspecific mild wall thickening in the gastric antrum possibly representing evolving postradiation change, and diffuse colonic diverticulosis.      07/04/2016 Imaging    CT A/P IMPRESSION: 1. No appreciable change in infiltrative hypoenhancing pancreatic body malignancy, with stable vascular involvement as detailed. 2. No evidence of metastatic disease in the abdomen or pelvis. 3. Stable mildly dilated central intrahepatic bile ducts and common bile duct. 4. Small volume free fluid in the deep pelvis. 5. Additional findings include aortic atherosclerosis, stable ectasia of the infrarenal abdominal aorta with maximum diameter 2.5 cm, decreased nonspecific mild wall thickening in the gastric antrum possibly representing evolving postradiation change, and diffuse colonic diverticulosis.      12/11/2016 -  12/12/2016 Hospital Admission    Admit date: 12/11/16 Admission diagnosis: Stroke Additional comments: on aspirin and will continue with rehab and supportive care      02/26/2017 Imaging    Abdomen Xray  IMPRESSION: 1. Scattered air-fluid levels in normal caliber bowel loops. This can be seen with gastroenteritis. 2. Prominent stool in the right and left colon.       HISTORY OF PRESENTING ILLNESS:  Nicole Sherman 83 y.o. female is here because of her recent abnormal CT scan, which showed a pink vaginal mass, highly suspicious for pancreatic cancer.  She has been haivng epigastric pain after meal for 4-5 months. She also reports left side pain at night when she sleeps on left side, mild back pain, the pain is worse lately, lasts longer especially afte rdinner, it about 5/10, appetite is lower than before, she lost aobut 10-20lbs in the past 5 months. No other complains, she had loose BM 1-2 time BM for 8-10 months, no hematochezia or melena. She was evaluated by her primary care physician Dr. Rex Kras, abdominal ultrasound was negative on 05/04/2015. She underwent a CT abdomen and pelvis with contrast on 05/16/2015, which showed a 4 x 1 cm mass in pancreatic body and a neck, tumor encases the distal celiac and proximal branches and occludes the main portal vein. No distant metastasis on the CT scan. She was referred to [place] for further workup.  She has good energy level, she is able to do all house work. She has intermittent dizziness from blood pressure meds. No other complains.   CURRENT THERAPY: supportive care   INTERIM HISTORY:  Davielle Lingelbach returns for follow up as scheduled. She presents to the clinic today with her husband and daughter. She notes that she is doing well overall. She reports that she will need a refill of her Norco at this time.   Since her last visit to the office, she underwent an Abdomen xray on 02/26/2017 with results showing: IMPRESSION: 1. Scattered air-fluid levels in normal caliber bowel loops. This can be seen with gastroenteritis. 2. Prominent stool in the right and left colon.  On review of systems, pt reports mild insomnia. She notes that she wakes up at 7:30 AM and  read the paper as well as a nap until lunch. She then consumes lunch and will take a nap following. She denies any new pain at this time and she contributes this to her pain medication, Norco which she takes 3-5 times a day. She has a decreased appetite and she denies having an appetite. She denies being prescribed any medications to aid with boosting her appetite. She denies any other symptoms.      MEDICAL HISTORY:  Past Medical History:  Diagnosis Date  . Arthritis    osteoarthritis-hands wrist  . Cancer ([place])    pancreatic mass- dx. adenocarcinoma" CT abdomen 05-16-15  . Diabetes mellitus without complication ([place])   . Diverticulosis    showing on CT of abdomen 05-16-15  . Hypertension   . Hypothyroidism     SURGICAL HISTORY: Past Surgical History:  Procedure Laterality Date  . ANKLE SURGERY Right   . APPENDECTOMY     open  . CATARACT EXTRACTION Right   . EUS N/A 05/28/2015   Procedure: UPPER ENDOSCOPIC ULTRASOUND (EUS) LINEAR;  Surgeon: Arta Silence, MD;  Location: [facility] ENDOSCOPY;  Service: Endoscopy;  Laterality: N/A;  . EYE SURGERY     macular hole surgery repair  . JOINT REPLACEMENT Right   . SHOULDER  ARTHROSCOPY W/ ROTATOR CUFF REPAIR Left   . SHOULDER OPEN ROTATOR CUFF REPAIR Right   . TUBAL LIGATION      SOCIAL HISTORY: Social History   Social History  . Marital Status: Married    Spouse Name: N/A  . Number of Children: 2 daughters   . Years of Education: N/A   Occupational History  . Not on file.   Social History Main Topics  . Smoking status: Former Smoker -- 1.00 packs/day for 30 years    Types: Cigarettes    Quit date: 05/21/1986  . Smokeless tobacco: Not on file  . Alcohol Use: No  . Drug Use: No  . Sexual Activity: Not on file   Other Topics Concern  . Not on file   Social History Narrative    FAMILY HISTORY: Family History  Problem Relation Age of Onset  . Stroke Mother   . Cancer Sister        lung cancer     ALLERGIES:  is  allergic to keflex [cephalexin] and aspirin.  MEDICATIONS:  Current Outpatient Medications  Medication Sig Dispense Refill  . atorvastatin (LIPITOR) 40 MG tablet Take 1 tablet (40 mg total) by mouth daily at 6 PM. 30 tablet 0  . docusate sodium (COLACE) 100 MG capsule Take 100 mg by mouth daily as needed for moderate constipation.     Marland Kitchen HYDROcodone-acetaminophen (NORCO) 7.5-325 MG tablet Take 1 tablet by mouth every 6 (six) hours as needed for moderate pain. 120 tablet 0  . metFORMIN (GLUCOPHAGE) 1000 MG tablet     . mupirocin ointment (BACTROBAN) 2 %     . ondansetron (ZOFRAN) 8 MG tablet Take 1 tablet (8 mg total) by mouth 2 (two) times daily. 30 tablet 2  . polyethylene glycol (MIRALAX / GLYCOLAX) packet Take 17 g daily by mouth.    . mirtazapine (REMERON) 7.5 MG tablet Take 1 tablet (7.5 mg total) by mouth at bedtime. 30 tablet 2  . prochlorperazine (COMPAZINE) 5 MG tablet Take 1-2 tablets (5-10 mg total) by mouth every 6 (six) hours as needed for nausea or vomiting. (Patient not taking: Reported on 03/23/2017) 30 tablet 2   No current facility-administered medications for this visit.     REVIEW OF SYSTEMS:  Constitutional: Denies fevers, chills or abnormal night sweats. (+) Fatigue (+) low appetite Eyes: Denies blurriness of vision, double vision or watery eyes Ears, nose, mouth, throat, and face: Denies mucositis or sore throat Respiratory: Denies cough, dyspnea or wheezes Cardiovascular: Denies palpitation, chest discomfort or lower extremity swelling Gastrointestinal:  Denies heartburn (+) constipation (+) bloating (+) occasional diarrhea (+) some nausea Skin: Denies abnormal skin rashes. Lymphatics: Denies new lymphadenopathy or easy bruising Neurological:Denies numbness, tingling or new weaknesses Behavioral/Psych: Mood is stable, no new changes  All other systems were reviewed with the patient and are negative.  PHYSICAL EXAMINATION:  ECOG PERFORMANCE STATUS: 2-3  Vitals:    05/04/17 1331  BP: 120/65  Pulse: 90  Resp: 16  Temp: (!) 97.5 F (36.4 C)  SpO2: 95%   Filed Weights   05/04/17 1331  Weight: 160 lb 6.4 oz (72.8 kg)     GENERAL:alert, oriented, appears to be pale  SKIN: skin color, texture, turgor are normal EYES: normal, conjunctiva are pink and non-injected, sclera clear OROPHARYNX:no exudate, no erythema and lips, buccal mucosa, and tongue normal  NECK: supple, thyroid normal size, non-tender, without nodularity LYMPH:  no palpable lymphadenopathy in the cervical, axillary or inguinal LUNGS: clear to auscultation  and percussion with normal breathing effort HEART: regular rate & rhythm and no murmurs and no lower extremity edema ABDOMEN:abdomen soft, non-tender and normal bowel sounds (+) slow moving bowel  Musculoskeletal:no cyanosis of digits and no clubbing  PSYCH: alert & oriented x 3 with fluent speech NEURO: no focal motor/sensory deficits  LABORATORY DATA:  I have reviewed the data as listed  CBC Latest Ref Rng & Units 05/04/2017 03/23/2017 02/26/2017  WBC 3.9 - 10.3 10e3/uL 4.9 4.6 4.7  Hemoglobin 11.6 - 15.9 g/dL 9.7(L) 10.3(L) 10.3(L)  Hematocrit 34.8 - 46.6 % 29.8(L) 31.5(L) 31.9(L)  Platelets 145 - 400 10e3/uL 249 216 190   CMP Latest Ref Rng & Units 05/04/2017 03/23/2017 02/26/2017  Glucose 70 - 140 mg/dl 162(H) 166(H) 169(H)  BUN 7.0 - 26.0 mg/dL 24.6 15.2 16.4  Creatinine 0.6 - 1.1 mg/dL 1.3(H) 1.0 1.0  Sodium 136 - 145 mEq/L 141 139 139  Potassium 3.5 - 5.1 mEq/L 3.7 4.0 4.4  Chloride 101 - 111 mmol/L - - -  CO2 22 - 29 mEq/L 24 24 25   Calcium 8.4 - 10.4 mg/dL 8.5 9.0 9.2  Total Protein 6.4 - 8.3 g/dL 7.1 6.8 6.9  Total Bilirubin 0.20 - 1.20 mg/dL 0.48 0.53 0.47  Alkaline Phos 40 - 150 U/L 97 70 67  AST 5 - 34 U/L 11 13 15   ALT 0 - 55 U/L 6 8 6     CA19.9 u/ML  05/31/2015: 751 07/03/2015: 227 07/19/2015: 159 08/23/2015: 304 09/03/2015: 309 10/15/2015: 357 12/31/2015: 121 01/30/2016: 77 02/28/2016:  51 04/17/16: 39 05/21/2015: 39 06/18/16: 56 07/30/16: 96 08/20/16: 137 10/01/16: 279   PATHOLOGY REPORT  Diagnosis 05/28/2015 FINE NEEDLE ASPIRATION: NEEDLE ASPIRATION, PANCREAS BODY (SPECIMEN 1 OF 1 COLLECTED 05/28/15): WELL DIFFERENTIATED ADENOCARCINOMA.   RADIOGRAPHIC STUDIES: I have personally reviewed the radiological images as listed and agreed with the findings in the report. No results found.   Abdomen Xray, 02/26/2017 IMPRESSION 1. Scattered air-fluid levels in normal caliber bowel loops. This can be seen with gastroenteritis. 2. Prominent stool in the right and left colon.  Brain MRI 12/12/16 IMPRESSION: 1. Acute small to moderate LEFT ACA territory infarct with petechial hemorrhage. 2. Mild chronic small vessel ischemic disease. Scattered old lacunar infarcts. Acute findings discussed with and reconfirmed by Judson Roch, charge nurse in [facility] ED on 12/12/2016 at 12:20 am.  CT A/P 07/04/16 IMPRESSION: 1. No appreciable change in infiltrative hypoenhancing pancreatic body malignancy, with stable vascular involvement as detailed. 2. No evidence of metastatic disease in the abdomen or pelvis. 3. Stable mildly dilated central intrahepatic bile ducts and common bile duct. 4. Small volume free fluid in the deep pelvis. 5. Additional findings include aortic atherosclerosis, stable ectasia of the infrarenal abdominal aorta with maximum diameter 2.5 cm, decreased nonspecific mild wall thickening in the gastric antrum possibly representing evolving postradiation change, and diffuse colonic diverticulosis.   ASSESSMENT & PLAN:  83 y.o.Caucasian female, presented with intermittent abdominal pain, weight loss, and a CT finding of a pancreatic mass.  1. Pancreatic body/head adenocarcinoma, well differentiated, cT2N1M0, stage IIB, unresectable -I previously reviewed her CT chest results, which was negative for metastatic disease. -The biopsy results was previously reviewed with  her and her family members in detail. -Her case was previously discussed in our GI tumor Board, Dr. Barry Dienes felt this is not resectable disease. -I previously reviewed the nature history of pancreatic cancer, which is aggressive. Her disease is incurable at this stage, and the goal of therapy is palliative and prolong  her life. -she tolerated first line chemo gemcitabine poorly, with diarrhea, poor appetite, and skin rash, and was switched to second line Xeloda, which was stopped due to disease progression. - I previously reviewed her restaging CT scan from 10/29/15, which unfortunately showed disease progression in pancreas, no other metastasis.  -She has completed radiation. CA19.9 has decreased significantly after radiation, indicating good response. -I have requested MSI test on her tumor tissue to see if she is a candidate for immunotherapy, unfortunately there was not enough tissue for the test. -I previously reviewed her restaging CT scan from 02/28/2016, which showed decreased size of the pancreatic mass, no liver or other metastasis. -she has had recurrent GI bleeding, spontaneously resolved, and did not require any intervention. -She is little reluctant to repeat colonoscopy or EGD. -We previously reviewed her restaging CT scan from 07/04/2016, which showed a stable pancreatic mass, no metastasis. -She previously has declined hospice. She would like to follow up with me in the clinic. - continue supportive care. She has had worsening epigastric pain, likely secondary to her pancreatic cancer, I encouraged her to increase Norco to very 4-6 hours -We also previously discussed the role of surgical block if her pain is not well controlled by medication -We'll consider repeating scan only if she develops other new symptoms of metastatic disease, such as pain, to see if she would benefit from palliative radiation. -Labs reviewed today, overall normal with mild anemia. -Due to her severe  constipation, and bloating we will do a Xray today (02/26/2017) to rule out SBO.  -Abdomen Xray on 02/26/2017 with results showing: 1. Scattered air-fluid levels in normal caliber bowel loops. This can be seen with gastroenteritis. 2. Prominent stool in the right and left colon. -Discussed and advised patient and her family regarding use of Remeron. Side effects were discussed with the patient and her family today.  -Will start the patient on Remeron to be taken at night only to stimulate appetite.  -Will refill Norco today, if the patient is taking 5-6 tablets a day, I will increase the dose.  -Lab and f/u in 8 weeks.   2. Anemia -She has intermittent worsening anemia, from GI bleeding, iron deficiency. -Her anemia has improved after IV Feraheme on 05/26/16 and 06/02/2016 -Ferritin on 07/02/16 was 238, which was a significant improvement following IV Faraheme. -she has not been taking an iron pill, but says she will start taking it again. I advised her she could take a multivitamin, still soft and with iron to avoid constipation. -We will continue to monitor and give IV iron as needed. -Anemia is mild and stable, overall improved previously -She is taking iron pill and anemia remains stable.  -Hg at 9.7 today and stable, no need for transfusion today.   3. Left LE DVT -Probably provoked by her underlying malignancy and chemotherapy -I previously recommended anticoagulation indefinitely, giving her incurable malignancy, if no contraindications such as bleeding. -She has had recurrent GI bleeding, Xarelto has been stopped   4. Fatigue, anorexia, and abdominal pain -improved some, she'll continue tramadol and Vicodin as needed. -I previously encouraged the patient to nap as needed, but then to remain active to improve her energy levels. -I previously encouraged the patient to eat smaller, more frequent meals to avoid gastric pain. -I previously recommended her to take Norco more frequent, she  takes it 4 times a day and controls her pain mostly. -The bloating in her stomach is related to her constipation.  -I suspect once we can  control her constipation she can eat better.  -Advised the patient to consume Ensure to aid with appetite.  -Advised the patient to take naps consisted of 1 hour at a time, also to remain active.  -Will prescribe Remeron to aid with increasing appetite.    5.HTN, DM, hypothyroidism  -follow up with PCP   6. Depression  -We previously discussed the patient feeling depressed by many factors; the cold weather and her cancer diagnosis. -We previously discussed she is doing well from her cancer standpoint. I previously encouraged her to be positive.  -We previously discussed antidepressant medication, she has respectfully declined. She is now agreeable to try mirtazapine now -she has very good social support   7. Goal of care  -We previously discussed her goal of care is palliative, giving the unresectable pancreatic cancer -Due to her advanced age and poor tolerance to chemotherapy, we'll continue palliative care alone. -We previously discussed hospice, she is not ready for it.   8. Constipation -She has ongoing constipation, worse lately. So has tried stool softener and laxatives -this occurs with cramps, nausea and occasional diarrhea -I previously suggested using Miralax  to use as it is mild and should not cause diarrhea. She can also try [place].  -She has not been using enough medication to help her. I recommend she takes half bottle of magnesium citrate and if no BM then take the rest of bottle. Then for regular maintenance I recommend Miralax twice a day, 2 cups in the morning and if needed 1 cup in the evening. She needs to have at least 1 BM every 1-2 days. I will give prescription Lactulose to use as needed.  -She is fine to have fleet enema if needed.   9. Stroke  -admitted for stroke on 12/11/16-12/12/16 -recovered well, with mild right-sided  residual weakness -On aspirin   Plan  -Continue supportive care, I reviewed her hydrocodone  -She will start Remeron 7.5 mg at bedtime for low appetite and depression -lab and f/u in 8 weeks     All questions were answered. The patient knows to call the clinic with any problems, questions or concerns.  I spent 20 minutes counseling the patient face to face. The total time spent in the appointment was 25 minutes and more than 50% was on counseling.   [name], MD 05/04/2017   This document serves as a record of services personally performed by [name], MD. It was created on her behalf by Steva Colder, a trained medical scribe. The creation of this record is based on the scribe's personal observations and the provider's statements to them.   I have reviewed the above documentation for accuracy and completeness, and I agree with the above.

## 2017-05-04 ENCOUNTER — Encounter: Payer: Self-pay | Admitting: Hematology

## 2017-05-04 ENCOUNTER — Other Ambulatory Visit (HOSPITAL_BASED_OUTPATIENT_CLINIC_OR_DEPARTMENT_OTHER): Payer: Medicare Other

## 2017-05-04 ENCOUNTER — Ambulatory Visit: Payer: Medicare Other | Admitting: Hematology

## 2017-05-04 ENCOUNTER — Telehealth: Payer: Self-pay | Admitting: Hematology

## 2017-05-04 VITALS — BP 120/65 | HR 90 | Temp 97.5°F | Resp 16 | Ht 66.5 in | Wt 160.4 lb

## 2017-05-04 DIAGNOSIS — C250 Malignant neoplasm of head of pancreas: Secondary | ICD-10-CM

## 2017-05-04 DIAGNOSIS — I10 Essential (primary) hypertension: Secondary | ICD-10-CM

## 2017-05-04 DIAGNOSIS — E119 Type 2 diabetes mellitus without complications: Secondary | ICD-10-CM | POA: Diagnosis not present

## 2017-05-04 DIAGNOSIS — Z86718 Personal history of other venous thrombosis and embolism: Secondary | ICD-10-CM

## 2017-05-04 DIAGNOSIS — C257 Malignant neoplasm of other parts of pancreas: Secondary | ICD-10-CM | POA: Diagnosis not present

## 2017-05-04 DIAGNOSIS — D630 Anemia in neoplastic disease: Secondary | ICD-10-CM

## 2017-05-04 DIAGNOSIS — C251 Malignant neoplasm of body of pancreas: Secondary | ICD-10-CM

## 2017-05-04 LAB — CBC WITH DIFFERENTIAL/PLATELET
BASO%: 1.4 % (ref 0.0–2.0)
BASOS ABS: 0.1 10*3/uL (ref 0.0–0.1)
EOS%: 1.7 % (ref 0.0–7.0)
Eosinophils Absolute: 0.1 10*3/uL (ref 0.0–0.5)
HEMATOCRIT: 29.8 % — AB (ref 34.8–46.6)
HGB: 9.7 g/dL — ABNORMAL LOW (ref 11.6–15.9)
LYMPH#: 0.5 10*3/uL — AB (ref 0.9–3.3)
LYMPH%: 11 % — ABNORMAL LOW (ref 14.0–49.7)
MCH: 31.4 pg (ref 25.1–34.0)
MCHC: 32.4 g/dL (ref 31.5–36.0)
MCV: 96.9 fL (ref 79.5–101.0)
MONO#: 0.5 10*3/uL (ref 0.1–0.9)
MONO%: 9.7 % (ref 0.0–14.0)
NEUT#: 3.7 10*3/uL (ref 1.5–6.5)
NEUT%: 76.2 % (ref 38.4–76.8)
PLATELETS: 249 10*3/uL (ref 145–400)
RBC: 3.08 10*6/uL — ABNORMAL LOW (ref 3.70–5.45)
RDW: 15.9 % — ABNORMAL HIGH (ref 11.2–14.5)
WBC: 4.9 10*3/uL (ref 3.9–10.3)

## 2017-05-04 LAB — COMPREHENSIVE METABOLIC PANEL
ALT: 6 U/L (ref 0–55)
AST: 11 U/L (ref 5–34)
Albumin: 3 g/dL — ABNORMAL LOW (ref 3.5–5.0)
Alkaline Phosphatase: 97 U/L (ref 40–150)
Anion Gap: 9 mEq/L (ref 3–11)
BUN: 24.6 mg/dL (ref 7.0–26.0)
CO2: 24 meq/L (ref 22–29)
Calcium: 8.5 mg/dL (ref 8.4–10.4)
Chloride: 108 mEq/L (ref 98–109)
Creatinine: 1.3 mg/dL — ABNORMAL HIGH (ref 0.6–1.1)
EGFR: 37 mL/min/{1.73_m2} — AB (ref >60)
GLUCOSE: 162 mg/dL — AB (ref 70–140)
POTASSIUM: 3.7 meq/L (ref 3.5–5.1)
SODIUM: 141 meq/L (ref 136–145)
TOTAL PROTEIN: 7.1 g/dL (ref 6.4–8.3)
Total Bilirubin: 0.48 mg/dL (ref 0.20–1.20)

## 2017-05-04 LAB — FERRITIN: FERRITIN: 85 ng/mL (ref 9–269)

## 2017-05-04 MED ORDER — HYDROCODONE-ACETAMINOPHEN 7.5-325 MG PO TABS: 1.0000 | ORAL_TABLET | Freq: Four times a day (QID) | ORAL | 0 refills | Status: DC | PRN

## 2017-05-04 MED ORDER — MIRTAZAPINE 7.5 MG PO TABS: 7.5000 mg | ORAL_TABLET | Freq: Every day | ORAL | 2 refills | Status: DC

## 2017-05-04 NOTE — Telephone Encounter (Signed)
Gave patient dtr avs report and appointment schedule for February.

## 2017-05-06 ENCOUNTER — Encounter: Payer: Self-pay | Admitting: Hematology

## 2017-05-11 ENCOUNTER — Other Ambulatory Visit: Payer: Self-pay | Admitting: Hematology

## 2017-05-11 ENCOUNTER — Telehealth: Payer: Self-pay | Admitting: *Deleted

## 2017-05-11 DIAGNOSIS — C257 Malignant neoplasm of other parts of pancreas: Secondary | ICD-10-CM

## 2017-05-11 MED ORDER — HYDROCODONE-ACETAMINOPHEN 7.5-325 MG PO TABS: 1.0000 | ORAL_TABLET | ORAL | 0 refills | Status: DC | PRN

## 2017-05-11 NOTE — Telephone Encounter (Signed)
Received call from daughter [name] stating that pt requested CT scan - since pt not feeling well, abdominal getting bigger, and not eating much.  [name] also requested new script for pain medication with increased quantity. Spoke with pt and was informed that pt would like to have CT scan to check on what is going on now - since it had been a long time from the last scan.  Pt stated she felt her abdomen getting larger ; denied shortness of breath, denied pain.  Eating small meals only.   Informed pt that Dr. Burr Medico ordered CT scan.  Pt understood that radiology scheduler will contact pt with appts for scan once insurance approval obtained.   Informed husband that a new script for Norco with higher quantity is ready for pick up.  Husband understood to bring old script back to nurse when he comes in to pick up new script.

## 2017-05-13 ENCOUNTER — Telehealth: Payer: Self-pay | Admitting: Hematology

## 2017-05-13 ENCOUNTER — Other Ambulatory Visit: Payer: Self-pay | Admitting: Hematology

## 2017-05-13 ENCOUNTER — Telehealth: Payer: Self-pay | Admitting: *Deleted

## 2017-05-13 MED ORDER — OXYCODONE HCL ER 10 MG PO T12A: 10.0000 mg | EXTENDED_RELEASE_TABLET | Freq: Two times a day (BID) | ORAL | 0 refills | Status: DC

## 2017-05-13 NOTE — Telephone Encounter (Signed)
Spoke with daughter [name] about pt's pain medication.  Per [name], pt would like a stronger pain medication so pt would not have to take many tablets of Norco ( pt takes up to 5 tablets per day ).   Message sent to Dr. Burr Medico.

## 2017-05-13 NOTE — Telephone Encounter (Signed)
Scheduled lab appt per 1/7 sch msg - spoke with daughter regarding appts.

## 2017-05-13 NOTE — Telephone Encounter (Signed)
I have e-scribed oxycontin 10mg  q12hr to her [place], please let her know, thanks   [name] MD

## 2017-05-13 NOTE — Telephone Encounter (Signed)
Spoke with daughter Doran Heater and gave her below instructions from Dr. Burr Medico.  Debbie voiced understanding.

## 2017-05-14 ENCOUNTER — Encounter: Payer: Self-pay | Admitting: Hematology

## 2017-05-14 NOTE — Progress Notes (Signed)
Received PA request for Oxycodone 10 mg er tableta.  Called [employer] to initiate auth. Answered questions and she states it was submitted and pending clinical review. A determination will be sent via fax.

## 2017-05-14 NOTE — Progress Notes (Signed)
Received PA determination for Oxycodone ER 10 mg.  Left denial on Dr.Feng's desk for review.

## 2017-05-15 ENCOUNTER — Other Ambulatory Visit: Payer: Self-pay | Admitting: Hematology

## 2017-05-15 MED ORDER — OXYCODONE ER 9 MG PO C12A: 9.0000 mg | EXTENDED_RELEASE_CAPSULE | Freq: Two times a day (BID) | ORAL | 0 refills | Status: DC

## 2017-05-18 ENCOUNTER — Ambulatory Visit (HOSPITAL_COMMUNITY): Payer: Medicare Other

## 2017-05-18 ENCOUNTER — Other Ambulatory Visit: Payer: Medicare Other

## 2017-05-20 ENCOUNTER — Encounter (HOSPITAL_COMMUNITY): Payer: Self-pay

## 2017-05-20 ENCOUNTER — Inpatient Hospital Stay: Payer: Medicare Other | Attending: Hematology

## 2017-05-20 ENCOUNTER — Ambulatory Visit (HOSPITAL_COMMUNITY)
Admission: RE | Admit: 2017-05-20 | Discharge: 2017-05-20 | Disposition: A | Discharge status: Home / Self Care | Payer: Medicare Other | Source: Ambulatory Visit | Attending: Hematology | Admitting: Hematology

## 2017-05-20 DIAGNOSIS — C257 Malignant neoplasm of other parts of pancreas: Secondary | ICD-10-CM | POA: Diagnosis present

## 2017-05-20 DIAGNOSIS — K869 Disease of pancreas, unspecified: Secondary | ICD-10-CM | POA: Diagnosis not present

## 2017-05-20 DIAGNOSIS — D630 Anemia in neoplastic disease: Secondary | ICD-10-CM

## 2017-05-20 DIAGNOSIS — R935 Abnormal findings on diagnostic imaging of other abdominal regions, including retroperitoneum: Secondary | ICD-10-CM | POA: Diagnosis not present

## 2017-05-20 DIAGNOSIS — C250 Malignant neoplasm of head of pancreas: Secondary | ICD-10-CM

## 2017-05-20 DIAGNOSIS — N134 Hydroureter: Secondary | ICD-10-CM | POA: Insufficient documentation

## 2017-05-20 LAB — CBC WITH DIFFERENTIAL/PLATELET
BASOS ABS: 0 10*3/uL (ref 0.0–0.1)
BASOS PCT: 0 %
EOS ABS: 0.1 10*3/uL (ref 0.0–0.5)
EOS PCT: 1 %
HCT: 28.5 % — ABNORMAL LOW (ref 34.8–46.6)
Hemoglobin: 8.9 g/dL — ABNORMAL LOW (ref 11.6–15.9)
Lymphocytes Relative: 9 %
Lymphs Abs: 0.6 10*3/uL — ABNORMAL LOW (ref 0.9–3.3)
MCH: 31 pg (ref 25.1–34.0)
MCHC: 31.2 g/dL — ABNORMAL LOW (ref 31.5–36.0)
MCV: 99.3 fL (ref 79.5–101.0)
MONO ABS: 0.6 10*3/uL (ref 0.1–0.9)
Monocytes Relative: 10 %
Neutro Abs: 5.2 10*3/uL (ref 1.5–6.5)
Neutrophils Relative %: 80 %
PLATELETS: 187 10*3/uL (ref 145–400)
RBC: 2.87 MIL/uL — AB (ref 3.70–5.45)
RDW: 15.9 % (ref 11.2–16.1)
WBC: 6.5 10*3/uL (ref 3.9–10.3)

## 2017-05-20 LAB — COMPREHENSIVE METABOLIC PANEL
ALBUMIN: 2.7 g/dL — AB (ref 3.5–5.0)
ALT: 4 U/L (ref 0–55)
AST: 10 U/L (ref 5–34)
Alkaline Phosphatase: 97 U/L (ref 40–150)
Anion gap: 11 (ref 3–11)
BUN: 22 mg/dL (ref 7–26)
CHLORIDE: 104 mmol/L (ref 98–109)
CO2: 23 mmol/L (ref 22–29)
Calcium: 8.4 mg/dL (ref 8.4–10.4)
Creatinine, Ser: 1.52 mg/dL — ABNORMAL HIGH (ref 0.60–1.10)
GFR calc Af Amer: 36 mL/min — ABNORMAL LOW (ref >60)
GFR calc non Af Amer: 31 mL/min — ABNORMAL LOW (ref >60)
GLUCOSE: 196 mg/dL — AB (ref 70–140)
POTASSIUM: 3.8 mmol/L (ref 3.3–4.7)
SODIUM: 138 mmol/L (ref 136–145)
Total Bilirubin: 0.4 mg/dL (ref 0.2–1.2)
Total Protein: 6.8 g/dL (ref 6.4–8.3)

## 2017-05-20 LAB — FERRITIN: FERRITIN: 88 ng/mL (ref 9–269)

## 2017-05-21 ENCOUNTER — Telehealth: Payer: Self-pay | Admitting: *Deleted

## 2017-05-21 NOTE — Telephone Encounter (Signed)
"  Sieria calling to confirm patient test is in [facility] for CT Adb/Pelvis to make sure she's aware of impressions."  Verified impression Number one and five read by [place].  Routing call information to collaborative nurse and provider for review.  Further patient communication through collaborative nurse.

## 2017-05-22 ENCOUNTER — Telehealth: Payer: Self-pay | Admitting: *Deleted

## 2017-05-22 NOTE — Telephone Encounter (Signed)
Received call from daughter stating that mother started a new pain med for pain & she is not able to tol at all & would like to go back to what she was on.  Questioned what was happening & she reports feeling sleepy but more in a state of delierum & having pain in between.  She only took 2-3 days.  Informed daughter that the xtampza ER is a long acting med & it takes a little while to get in her system & she should take hydrocodone in between for breakthrough pain.  She may get used to the xtampza over time.  Encouraged to try again with hydrocodone for back up & give some time to see if she tol better.  If she doesn't want to give the xtamza a try, OK to go back to hydrocodone.  Message routed to Dr Burr Medico.

## 2017-05-22 NOTE — Telephone Encounter (Signed)
Agree with the plan, she is seeing [place] next week, will discuss further.   [name] MD

## 2017-05-25 ENCOUNTER — Other Ambulatory Visit: Payer: Self-pay

## 2017-05-25 ENCOUNTER — Encounter (HOSPITAL_COMMUNITY): Payer: Self-pay | Admitting: Internal Medicine

## 2017-05-25 ENCOUNTER — Emergency Department (HOSPITAL_COMMUNITY): Payer: Medicare Other

## 2017-05-25 ENCOUNTER — Inpatient Hospital Stay (HOSPITAL_COMMUNITY)
Admission: EM | Admit: 2017-05-25 | Discharge: 2017-05-26 | DRG: 871 | Disposition: A | Discharge status: Hospice | Payer: Medicare Other | Attending: Internal Medicine | Admitting: Internal Medicine

## 2017-05-25 ENCOUNTER — Ambulatory Visit: Payer: Medicare Other | Admitting: Hematology

## 2017-05-25 DIAGNOSIS — C799 Secondary malignant neoplasm of unspecified site: Secondary | ICD-10-CM | POA: Diagnosis not present

## 2017-05-25 DIAGNOSIS — Z6823 Body mass index (BMI) 23.0-23.9, adult: Secondary | ICD-10-CM

## 2017-05-25 DIAGNOSIS — Z881 Allergy status to other antibiotic agents status: Secondary | ICD-10-CM

## 2017-05-25 DIAGNOSIS — Z79899 Other long term (current) drug therapy: Secondary | ICD-10-CM

## 2017-05-25 DIAGNOSIS — N3000 Acute cystitis without hematuria: Secondary | ICD-10-CM | POA: Diagnosis not present

## 2017-05-25 DIAGNOSIS — Z823 Family history of stroke: Secondary | ICD-10-CM

## 2017-05-25 DIAGNOSIS — N179 Acute kidney failure, unspecified: Secondary | ICD-10-CM | POA: Diagnosis not present

## 2017-05-25 DIAGNOSIS — Z66 Do not resuscitate: Secondary | ICD-10-CM | POA: Diagnosis not present

## 2017-05-25 DIAGNOSIS — Z87891 Personal history of nicotine dependence: Secondary | ICD-10-CM

## 2017-05-25 DIAGNOSIS — Z515 Encounter for palliative care: Secondary | ICD-10-CM | POA: Diagnosis not present

## 2017-05-25 DIAGNOSIS — Z8673 Personal history of transient ischemic attack (TIA), and cerebral infarction without residual deficits: Secondary | ICD-10-CM | POA: Diagnosis not present

## 2017-05-25 DIAGNOSIS — A419 Sepsis, unspecified organism: Principal | ICD-10-CM

## 2017-05-25 DIAGNOSIS — N390 Urinary tract infection, site not specified: Secondary | ICD-10-CM | POA: Diagnosis not present

## 2017-05-25 DIAGNOSIS — I10 Essential (primary) hypertension: Secondary | ICD-10-CM | POA: Diagnosis not present

## 2017-05-25 DIAGNOSIS — C259 Malignant neoplasm of pancreas, unspecified: Secondary | ICD-10-CM | POA: Diagnosis not present

## 2017-05-25 DIAGNOSIS — E43 Unspecified severe protein-calorie malnutrition: Secondary | ICD-10-CM | POA: Diagnosis not present

## 2017-05-25 DIAGNOSIS — E119 Type 2 diabetes mellitus without complications: Secondary | ICD-10-CM | POA: Diagnosis present

## 2017-05-25 DIAGNOSIS — Z886 Allergy status to analgesic agent status: Secondary | ICD-10-CM

## 2017-05-25 DIAGNOSIS — M1990 Unspecified osteoarthritis, unspecified site: Secondary | ICD-10-CM | POA: Diagnosis present

## 2017-05-25 DIAGNOSIS — R509 Fever, unspecified: Secondary | ICD-10-CM | POA: Diagnosis present

## 2017-05-25 DIAGNOSIS — Z801 Family history of malignant neoplasm of trachea, bronchus and lung: Secondary | ICD-10-CM

## 2017-05-25 DIAGNOSIS — R627 Adult failure to thrive: Secondary | ICD-10-CM | POA: Diagnosis not present

## 2017-05-25 DIAGNOSIS — Z7984 Long term (current) use of oral hypoglycemic drugs: Secondary | ICD-10-CM

## 2017-05-25 DIAGNOSIS — E876 Hypokalemia: Secondary | ICD-10-CM | POA: Diagnosis not present

## 2017-05-25 DIAGNOSIS — E039 Hypothyroidism, unspecified: Secondary | ICD-10-CM | POA: Diagnosis present

## 2017-05-25 DIAGNOSIS — I959 Hypotension, unspecified: Secondary | ICD-10-CM | POA: Diagnosis not present

## 2017-05-25 DIAGNOSIS — R4182 Altered mental status, unspecified: Secondary | ICD-10-CM | POA: Diagnosis present

## 2017-05-25 LAB — PROTIME-INR
INR: 1.57
Prothrombin Time: 18.7 seconds — ABNORMAL HIGH (ref 11.4–15.2)

## 2017-05-25 LAB — CBC WITH DIFFERENTIAL/PLATELET
Basophils Absolute: 0 10*3/uL (ref 0.0–0.1)
Basophils Relative: 0 %
EOS ABS: 0 10*3/uL (ref 0.0–0.7)
Eosinophils Relative: 1 %
HEMATOCRIT: 29.3 % — AB (ref 36.0–46.0)
HEMOGLOBIN: 9.2 g/dL — AB (ref 12.0–15.0)
LYMPHS ABS: 0.2 10*3/uL — AB (ref 0.7–4.0)
Lymphocytes Relative: 3 %
MCH: 31.2 pg (ref 26.0–34.0)
MCHC: 31.4 g/dL (ref 30.0–36.0)
MCV: 99.3 fL (ref 78.0–100.0)
MONO ABS: 0 10*3/uL — AB (ref 0.1–1.0)
MONOS PCT: 1 %
NEUTROS ABS: 5.7 10*3/uL (ref 1.7–7.7)
NEUTROS PCT: 95 %
Platelets: 138 10*3/uL — ABNORMAL LOW (ref 150–400)
RBC: 2.95 MIL/uL — ABNORMAL LOW (ref 3.87–5.11)
RDW: 16.1 % — ABNORMAL HIGH (ref 11.5–15.5)
WBC: 6 10*3/uL (ref 4.0–10.5)

## 2017-05-25 LAB — COMPREHENSIVE METABOLIC PANEL
ALBUMIN: 2.4 g/dL — AB (ref 3.5–5.0)
ALT: 15 U/L (ref 14–54)
AST: 57 U/L — AB (ref 15–41)
Alkaline Phosphatase: 153 U/L — ABNORMAL HIGH (ref 38–126)
Anion gap: 11 (ref 5–15)
BILIRUBIN TOTAL: 1.2 mg/dL (ref 0.3–1.2)
BUN: 25 mg/dL — AB (ref 6–20)
CO2: 17 mmol/L — ABNORMAL LOW (ref 22–32)
CREATININE: 1.93 mg/dL — AB (ref 0.44–1.00)
Calcium: 7.9 mg/dL — ABNORMAL LOW (ref 8.9–10.3)
Chloride: 108 mmol/L (ref 101–111)
GFR calc Af Amer: 27 mL/min — ABNORMAL LOW (ref >60)
GFR calc non Af Amer: 23 mL/min — ABNORMAL LOW (ref >60)
GLUCOSE: 205 mg/dL — AB (ref 65–99)
POTASSIUM: 2.9 mmol/L — AB (ref 3.5–5.1)
Sodium: 136 mmol/L (ref 135–145)
TOTAL PROTEIN: 5.7 g/dL — AB (ref 6.5–8.1)

## 2017-05-25 LAB — URINALYSIS, ROUTINE W REFLEX MICROSCOPIC
Bilirubin Urine: NEGATIVE
Glucose, UA: NEGATIVE mg/dL
KETONES UR: NEGATIVE mg/dL
Nitrite: NEGATIVE
PROTEIN: NEGATIVE mg/dL
Specific Gravity, Urine: 1.01 (ref 1.005–1.030)
pH: 5 (ref 5.0–8.0)

## 2017-05-25 LAB — LACTIC ACID, PLASMA: Lactic Acid, Venous: 5 mmol/L (ref 0.5–1.9)

## 2017-05-25 LAB — I-STAT CG4 LACTIC ACID, ED
Lactic Acid, Venous: 7.85 mmol/L (ref 0.5–1.9)
Lactic Acid, Venous: 5.83 mmol/L (ref 0.5–1.9)

## 2017-05-25 MED ORDER — MORPHINE SULFATE (PF) 4 MG/ML IV SOLN
4.0000 mg | Freq: Once | INTRAVENOUS | Status: AC | PRN
  Administered 2017-05-25: 4 mg via INTRAVENOUS
  Filled 2017-05-25: qty 1

## 2017-05-25 MED ORDER — SODIUM CHLORIDE 0.9 % IV BOLUS (SEPSIS)
1000.0000 mL | Freq: Once | INTRAVENOUS | Status: AC
  Administered 2017-05-25: 1000 mL via INTRAVENOUS

## 2017-05-25 MED ORDER — ACETAMINOPHEN 325 MG PO TABS
650.0000 mg | ORAL_TABLET | Freq: Once | ORAL | Status: AC
  Administered 2017-05-25: 650 mg via ORAL
  Filled 2017-05-25: qty 2

## 2017-05-25 MED ORDER — LEVOFLOXACIN IN D5W 750 MG/150ML IV SOLN
750.0000 mg | Freq: Once | INTRAVENOUS | Status: AC
  Administered 2017-05-25: 750 mg via INTRAVENOUS
  Filled 2017-05-25: qty 150

## 2017-05-25 MED ORDER — SODIUM CHLORIDE 0.9 % IV SOLN
1.0000 mg/h | INTRAVENOUS | Status: DC
  Administered 2017-05-26: 1 mg/h via INTRAVENOUS
  Filled 2017-05-25: qty 10

## 2017-05-25 MED ORDER — MORPHINE SULFATE (PF) 4 MG/ML IV SOLN
INTRAVENOUS | Status: AC
  Administered 2017-05-25: 1 mg via INTRAVENOUS
  Filled 2017-05-25: qty 1

## 2017-05-25 MED ORDER — VANCOMYCIN HCL IN DEXTROSE 1-5 GM/200ML-% IV SOLN
1000.0000 mg | Freq: Once | INTRAVENOUS | Status: AC
  Administered 2017-05-25: 1000 mg via INTRAVENOUS
  Filled 2017-05-25: qty 200

## 2017-05-25 MED ORDER — HYOSCYAMINE SULFATE 0.125 MG SL SUBL
0.1250 mg | SUBLINGUAL_TABLET | Freq: Four times a day (QID) | SUBLINGUAL | Status: DC | PRN
  Filled 2017-05-25: qty 1

## 2017-05-25 MED ORDER — AZTREONAM 2 G IJ SOLR
2.0000 g | Freq: Once | INTRAMUSCULAR | Status: AC
  Administered 2017-05-25: 2 g via INTRAVENOUS
  Filled 2017-05-25: qty 2

## 2017-05-25 MED ORDER — ACETAMINOPHEN 325 MG PO TABS: 650.0000 mg | ORAL_TABLET | Freq: Four times a day (QID) | ORAL | Status: DC | PRN

## 2017-05-25 MED ORDER — LORAZEPAM 2 MG/ML IJ SOLN: 0.5000 mg | Freq: Four times a day (QID) | INTRAMUSCULAR | Status: DC | PRN

## 2017-05-25 MED ORDER — SODIUM CHLORIDE 0.9 % IV BOLUS (SEPSIS)
1000.0000 mL | Freq: Once | INTRAVENOUS | Status: AC
Start: 2017-05-25 — End: 2017-05-25
  Administered 2017-05-25: 1000 mL via INTRAVENOUS

## 2017-05-25 MED ORDER — SODIUM CHLORIDE 0.9 % IV BOLUS (SEPSIS)
250.0000 mL | Freq: Once | INTRAVENOUS | Status: AC
  Administered 2017-05-25: 250 mL via INTRAVENOUS

## 2017-05-25 MED ORDER — MORPHINE SULFATE (PF) 4 MG/ML IV SOLN
1.0000 mg | INTRAVENOUS | Status: AC
  Administered 2017-05-25: 1 mg via INTRAVENOUS

## 2017-05-25 MED ORDER — MORPHINE SULFATE (PF) 4 MG/ML IV SOLN: 1.0000 mg | INTRAVENOUS | Status: DC | PRN

## 2017-05-25 NOTE — Progress Notes (Signed)
A consult was received from an ED physician for Vancomycin, Levofloxacin, and Aztreonam per pharmacy dosing.  The patient's profile has been reviewed for ht/wt/allergies/indication/available labs.   A one time order has already been placed by physician for Vancomycin 1g, Levofloxacin 750mg , and Aztreonam 2g IV.  Further antibiotics/pharmacy consults should be ordered by admitting physician if indicated.                       Thank you, Luiz Ochoa 05/25/2017  5:14 PM

## 2017-05-25 NOTE — ED Provider Notes (Addendum)
[facility] DEPT Provider Note   CSN: [id number] Arrival date & time: 05/25/17  1624     History   Chief Complaint Chief Complaint  Patient presents with  . Altered Mental Status    HPI Nicole Sherman is a 84 y.o. female.   Altered Mental Status     Patient presents to the emergency room for evaluation of altered mental status.  Patient has a history of metastatic pancreatic cancer.  Patient had a recent CT scan on January 16 that showed findings suggestive of local pancreatic cancer progression at the level of the superior mesenteric artery.  She is not going any active chemotherapy infusion treatments.  She also had increase in intraperitoneal free fluid.  Patient also had new left hydroureter and nodularity in the omentum concerning for metastatic pancreatic carcinoma progression.  The history is limited in the emergency room because the patient does not want to answer questions.  EMS reports that the patient was difficult to arouse and slow to respond today.  She has not had much of an appetite.  Family states she is not been urinating as well.  Patient in the emergency room does not want to answer my questions.  She did wake up and state that she "is not much for talking right now." Past Medical History:  Diagnosis Date  . Arthritis    osteoarthritis-hands wrist  . Cancer ([place])    pancreatic mass- dx. adenocarcinoma" CT abdomen 05-16-15  . Diabetes mellitus without complication ([place])   . Diverticulosis    showing on CT of abdomen 05-16-15  . Hypertension   . Hypothyroidism     Patient Active Problem List   Diagnosis Date Noted  . Acute ischemic stroke ([place]) 12/12/2016  . Iron deficiency anemia due to chronic blood loss 05/26/2016  . Hypoalbuminemia due to protein-calorie malnutrition ([place]) 11/18/2015  . Lower GI bleeding 11/13/2015  . Hypothyroidism 07/19/2015  . Hypertension 07/19/2015  . Diabetes mellitus without complication ([place])  79/06/4095  . History of DVT of lower extremity 07/09/2015  . Carcinoma of body of pancreas ([place]) 05/31/2015    Past Surgical History:  Procedure Laterality Date  . ANKLE SURGERY Right   . APPENDECTOMY     open  . CATARACT EXTRACTION Right   . EUS N/A 05/28/2015   Procedure: UPPER ENDOSCOPIC ULTRASOUND (EUS) LINEAR;  Surgeon: Arta Silence, MD;  Location: [facility] ENDOSCOPY;  Service: Endoscopy;  Laterality: N/A;  . EYE SURGERY     macular hole surgery repair  . JOINT REPLACEMENT Right   . SHOULDER ARTHROSCOPY W/ ROTATOR CUFF REPAIR Left   . SHOULDER OPEN ROTATOR CUFF REPAIR Right   . TUBAL LIGATION      OB History    No data available       Home Medications    Prior to Admission medications   Medication Sig Start Date End Date Taking? Authorizing Provider  atorvastatin (LIPITOR) 40 MG tablet Take 1 tablet (40 mg total) by mouth daily at 6 PM. 12/12/16  Yes Mariel Aloe, MD  HYDROcodone-acetaminophen (NORCO) 7.5-325 MG tablet Take 1 tablet by mouth every 4 (four) hours as needed for moderate pain. 05/11/17  Yes [name], MD  metFORMIN (GLUCOPHAGE) 1000 MG tablet Take 1,000 mg by mouth every evening.  01/12/17  Yes [provider]  ondansetron (ZOFRAN) 8 MG tablet Take 1 tablet (8 mg total) by mouth 2 (two) times daily. Patient taking differently: Take 8 mg by mouth every 8 (eight) hours  as needed for nausea or vomiting.  01/09/17  Yes Alla Feeling, NP  polyethylene glycol (MIRALAX / GLYCOLAX) packet Take 17 g by mouth daily as needed for moderate constipation.    Yes [provider]  docusate sodium (COLACE) 100 MG capsule Take 100 mg by mouth daily as needed for moderate constipation.     [provider]  mirtazapine (REMERON) 7.5 MG tablet Take 1 tablet (7.5 mg total) by mouth at bedtime. Patient not taking: Reported on 05/25/2017 05/04/17   [name], MD  mupirocin ointment Drue Stager) 2 %  01/16/17   [provider]  oxyCODONE (OXYCONTIN) 10  mg 12 hr tablet Take 1 tablet (10 mg total) by mouth every 12 (twelve) hours. Patient not taking: Reported on 05/25/2017 05/13/17   [name], MD  OxyCODONE ER [facility]) 9 MG C12A Take 9 mg by mouth every 12 (twelve) hours. Patient not taking: Reported on 05/25/2017 05/15/17   [name], MD  prochlorperazine (COMPAZINE) 5 MG tablet Take 1-2 tablets (5-10 mg total) by mouth every 6 (six) hours as needed for nausea or vomiting. Patient not taking: Reported on 03/23/2017 01/23/17   [name], MD    Family History Family History  Problem Relation Age of Onset  . Stroke Mother   . Cancer Sister        lung cancer     Social History Social History   Tobacco Use  . Smoking status: Former Smoker    Packs/day: 1.00    Years: 30.00    Pack years: 30.00    Types: Cigarettes    Last attempt to quit: 05/21/1986    Years since quitting: 31.0  . Smokeless tobacco: Never Used  Substance Use Topics  . Alcohol use: No  . Drug use: No     Allergies   Keflex [cephalexin] and Aspirin   Review of Systems Review of Systems  All other systems reviewed and are negative.    Physical Exam Updated Vital Signs BP 109/62   Pulse 100   Temp (!) 102.1 F (38.9 C) (Rectal)   Resp (!) 24   Ht 1.689 m (5' 6.5")   Wt 68 kg (150 lb)   SpO2 95%   BMI 23.85 kg/m   Physical Exam  Constitutional: She appears lethargic. No distress.  Elderly, frail  HENT:  Head: Normocephalic and atraumatic.  Right Ear: External ear normal.  Left Ear: External ear normal.  Eyes: Conjunctivae are normal. Right eye exhibits no discharge. Left eye exhibits no discharge. No scleral icterus.  Neck: Neck supple. No tracheal deviation present.  Cardiovascular: Normal rate, regular rhythm and intact distal pulses.  Pulmonary/Chest: Effort normal and breath sounds normal. No stridor. No respiratory distress. She has no wheezes. She has no rales.  Abdominal: Soft. Bowel sounds are normal. She exhibits no distension.  There is no tenderness. There is no rebound and no guarding.  Musculoskeletal: She exhibits no edema or tenderness.  Neurological: She appears lethargic. No cranial nerve deficit (no facial droop, extraocular movements intact, no slurred speech) or sensory deficit. She exhibits normal muscle tone. She displays no seizure activity. Coordination normal. GCS eye subscore is 3. GCS verbal subscore is 4. GCS motor subscore is 6.  Patient will wake up and answer questions consistently, she does not provide any history, I also cannot get her to comply with physical exam and can commands consistently  Skin: Skin is warm and dry. No rash noted.  Psychiatric: She has a normal  mood and affect.  Nursing note and vitals reviewed.    ED Treatments / Results  Labs (all labs ordered are listed, but only abnormal results are displayed) Labs Reviewed  COMPREHENSIVE METABOLIC PANEL - Abnormal; Notable for the following components:      Result Value   Potassium 2.9 (*)    CO2 17 (*)    Glucose, Bld 205 (*)    BUN 25 (*)    Creatinine, Ser 1.93 (*)    Calcium 7.9 (*)    Total Protein 5.7 (*)    Albumin 2.4 (*)    AST 57 (*)    Alkaline Phosphatase 153 (*)    GFR calc non Af Amer 23 (*)    GFR calc Af Amer 27 (*)    All other components within normal limits  CBC WITH DIFFERENTIAL/PLATELET - Abnormal; Notable for the following components:   RBC 2.95 (*)    Hemoglobin 9.2 (*)    HCT 29.3 (*)    RDW 16.1 (*)    Platelets 138 (*)    Lymphs Abs 0.2 (*)    Monocytes Absolute 0.0 (*)    All other components within normal limits  PROTIME-INR - Abnormal; Notable for the following components:   Prothrombin Time 18.7 (*)    All other components within normal limits  URINALYSIS, ROUTINE W REFLEX MICROSCOPIC - Abnormal; Notable for the following components:   APPearance HAZY (*)    Hgb urine dipstick MODERATE (*)    Leukocytes, UA SMALL (*)    Bacteria, UA MANY (*)    Squamous Epithelial / LPF 0-5 (*)     All other components within normal limits  I-STAT CG4 LACTIC ACID, ED - Abnormal; Notable for the following components:   Lactic Acid, Venous 5.83 (*)    All other components within normal limits  CULTURE, BLOOD (ROUTINE X 2)  CULTURE, BLOOD (ROUTINE X 2)  URINE CULTURE  I-STAT CG4 LACTIC ACID, ED  I-STAT CG4 LACTIC ACID, ED    EKG  EKG Interpretation  Date/Time:  Monday May 25 2017 16:45:47 EST Ventricular Rate:  105 PR Interval:    QRS Duration: 92 QT Interval:  438 QTC Calculation: 579 R Axis:   -15 Text Interpretation:  probable sinus tachycardia Borderline left axis deviation Low voltage, extremity and precordial leads Nonspecific T abnormalities, lateral leads Prolonged QT interval Confirmed by Dorie Rank [phone]) on 05/25/2017 5:47:39 PM       Radiology Ct Head Wo Contrast  Result Date: 05/25/2017 CLINICAL DATA:  Difficult to arouse and slow to respond. Altered level of consciousness. EXAM: CT HEAD WITHOUT CONTRAST TECHNIQUE: Contiguous axial images were obtained from the base of the skull through the vertex without intravenous contrast. COMPARISON:  12/11/2016 and 09/24/2015 CT FINDINGS: Brain: Chronic stable superficial atrophy with small vessel ischemic disease. Chronic left frontal lobe area of encephalomalacia. No acute intracranial mass, edema or midline shift. No extra-axial fluid collections nor intra-axial masses. Vascular: No hyperdense vessel or unexpected calcification. Skull: Negative for acute fracture or focal lesions. Sinuses/Orbits: No acute finding. Other: None IMPRESSION: 1. Chronic stable superficial atrophy and small vessel ischemic disease. 2. Chronic left high frontal lobe focus of encephalomalacia. 3. No acute intracranial abnormality. Electronically Signed   By: Ashley Royalty M.D.   On: 05/25/2017 18:30   Dg Chest Port 1 View  Result Date: 05/25/2017 CLINICAL DATA:  Fever, altered mental status, pancreatic adenocarcinoma post chemotherapy EXAM:  PORTABLE CHEST 1 VIEW COMPARISON:  Portable exam 1723 hours  compared to 10/20/2010 and correlated with interval CT chest of 02/28/2016 FINDINGS: Normal heart size and pulmonary vascularity. Atherosclerotic calcification aorta. Bibasilar atelectasis and question small pleural effusions. Central peribronchial thickening. Upper lungs clear. No pneumothorax. Bones demineralized. IMPRESSION: Bronchitic changes with bibasilar atelectasis and probable small pleural effusions. Electronically Signed   By: Lavonia Dana M.D.   On: 05/25/2017 17:48    Procedures .Critical Care Performed by: Dorie Rank, MD Authorized by: Dorie Rank, MD   Critical care provider statement:    Critical care time (minutes):  30   Critical care was time spent personally by me on the following activities:  Discussions with consultants, evaluation of patient's response to treatment, examination of patient, ordering and performing treatments and interventions, ordering and review of laboratory studies, ordering and review of radiographic studies, pulse oximetry, re-evaluation of patient's condition, obtaining history from patient or surrogate and review of old charts   (including critical care time)  Medications Ordered in ED Medications  morphine 4 MG/ML injection 4 mg (not administered)  sodium chloride 0.9 % bolus 1,000 mL (0 mLs Intravenous Stopped 05/25/17 1836)    And  sodium chloride 0.9 % bolus 1,000 mL (0 mLs Intravenous Stopped 05/25/17 1836)    And  sodium chloride 0.9 % bolus 250 mL (0 mLs Intravenous Stopped 05/25/17 1933)  levofloxacin (LEVAQUIN) IVPB 750 mg (0 mg Intravenous Stopped 05/25/17 1924)  aztreonam (AZACTAM) 2 g in dextrose 5 % 50 mL IVPB (0 g Intravenous Stopped 05/25/17 1836)  vancomycin (VANCOCIN) IVPB 1000 mg/200 mL premix (0 mg Intravenous Stopped 05/25/17 2042)  acetaminophen (TYLENOL) tablet 650 mg (650 mg Oral Given 05/25/17 1942)     Initial Impression / Assessment and Plan / ED Course  I have  reviewed the triage vital signs and the nursing notes.  Pertinent labs & imaging results that were available during my care of the patient were reviewed by me and considered in my medical decision making (see chart for details).  Clinical Course as of May 25 2109  Mon May 25, 2017  1759 Patient presents with altered mental status and fever.  Symptoms concerning for sepsis.  Empiric antibiotics and fluids started.  [JK]  1949 BP has improved.  UA pending.  Head CT without acute findings.  [JK]  1958 Patient is more alert.  She is awake and answering questions.  Her blood pressure is improving and her heart rate is in decreasing.  Discussed findings with the patient and her family.  [JK]  2002 She confirmed DNR status  [JK]  2039 Note: Pt has had two lactic acid levels.  First was greater than 7.  Second is down to 5.83  [JK]  2039 UA is consistent with UTI  [JK]    Clinical Course User Index [JK] Dorie Rank, MD    Patient presented to the emergency room with altered mental status and fever.  She has an elevated lactic acid level.  Her symptoms are concerning for sepsis.  Patient does have a very complicated medical history.  She has metastatic pancreatic cancer that unfortunately shows progression based on a CT scan performed in this past week.  Patient and family were had not actually aware of the results.  patient has improved with IV fluids and antibiotics.  Blood pressure however still is borderline.  Urinalysis shows a UTI which I suspect is the source of her infection.  I will bring her into the hospital for IV antibiotics.  Patient may benefit from a palliative care  consult.  Final Clinical Impressions(s) / ED Diagnoses   Final diagnoses:  Acute cystitis without hematuria  Sepsis, due to unspecified organism [facility])      Dorie Rank, MD 05/25/17 2112  Patient's blood pressure was down into the 60s.  Dr. Maudie Mercury asked me about possibly placing a central line.  I spoke to the patient about  doing this.  I explained we would put in a central line to give her pressors to help raise her blood pressure.  I explained to her the options of IV fluids alone versus pressors.  I explained that it is possible her blood pressure would continue to get worse and this could prove fatal.  Patient is aware of her worsening CT cancer.  Her daughters were in the room with her.  Patient states she does not want a central line or any medications to raise her blood pressure.  She did ask for additional pain medications.  I think this is reasonable.  We will hold off on central line at this time.  Patient understands she can change her mind.   Dorie Rank, MD 05/25/17 2244

## 2017-05-25 NOTE — ED Notes (Signed)
RN took pt's BP multiple times both manually and automatically in both arms. Pt's BP was 60's/40's all times.  RN discussed that when a pt's BP is that low that the doctor's generally like to try and increase the BP by giving vasopressors. Pt was adamant about not trying vasopressors.

## 2017-05-25 NOTE — ED Notes (Signed)
Rn spoke with admitting MD regarding pt's blood pressure. MD stated he would come speak to pt and family

## 2017-05-25 NOTE — ED Triage Notes (Signed)
Per EMS: Pt has not had chemo since 2017. Pt was difficult to arouse and slow to respond. Pt had decreased appetite and activity starting today. Pt was given 857ml of fluid with Ems and pt was more alert. Pt states she had difficulty peeing earlier today. Pt is from home.  Family is on the way.  Pt was alert and oriented with EMS.  Pt was warm and reports a headache and stomach ache with EMS.

## 2017-05-25 NOTE — H&P (Signed)
TRH H&P   Patient Demographics:    Nicole Sherman, is a 84 y.o. female  MRN: [id number]   DOB - November 16, 1933  Admit Date - 05/25/2017  Outpatient Primary MD for the patient is Hulan Fess, MD  Referring MD/NP/PA:   Marye Round  Outpatient Specialists:     Patient coming from: home  Chief Complaint  Patient presents with  . Altered Mental Status      HPI:    Nicole Sherman  is a 84 y.o. female, w dm2, hypertension, hypothyroidism, pancreatic cancer apparently presents with some confusion.  In ED CT brain  IMPRESSION: 1. Chronic stable superficial atrophy and small vessel ischemic disease. 2. Chronic left high frontal lobe focus of encephalomalacia. 3. No acute intracranial abnormality.   CXR IMPRESSION: Bronchitic changes with bibasilar atelectasis and probable small pleural effusions.  Na 136, K 2.9, Bun 25, Creatinine 25/1.93 Alb 2.4, Ast 57, Alt 15, alk pohs 153, T. Bili 1.2  Urinalysis  Wbc 6-30  Pt states that she is not interested in pressors at this time.  She requests DNR .  Pt stated this in front of Dr. Tomi Bamberger and her family.   Pt will be admitted for AMS secondary to  urosepsis, and ARF and hypokalemia.        Review of systems:    In addition to the HPI above,  No Headache, No changes with Vision or hearing, No problems swallowing food or Liquids, No Chest pain, Cough or Shortness of Breath, No Abdominal pain, No Nausea or Vommitting, Bowel movements are regular, No Blood in stool or Urine, No dysuria, No new skin rashes or bruises, No new joints pains-aches,  No new weakness, tingling, numbness in any extremity, No recent weight gain or loss, No polyuria, polydypsia or polyphagia, No significant Mental Stressors.  A full 10 point Review of Systems was done, except as stated above, all other Review of Systems were negative.   With  Past History of the following :    Past Medical History:  Diagnosis Date  . Arthritis    osteoarthritis-hands wrist  . Cancer ([place])    pancreatic mass- dx. adenocarcinoma" CT abdomen 05-16-15  . Diabetes mellitus without complication ([place])   . Diverticulosis    showing on CT of abdomen 05-16-15  . Hypertension   . Hypothyroidism       Past Surgical History:  Procedure Laterality Date  . ANKLE SURGERY Right   . APPENDECTOMY     open  . CATARACT EXTRACTION Right   . EUS N/A 05/28/2015   Procedure: UPPER ENDOSCOPIC ULTRASOUND (EUS) LINEAR;  Surgeon: Arta Silence, MD;  Location: [facility];  Service: Endoscopy;  Laterality: N/A;  . EYE SURGERY     macular hole surgery repair  . JOINT REPLACEMENT Right   . SHOULDER ARTHROSCOPY W/ ROTATOR CUFF REPAIR Left   . SHOULDER OPEN ROTATOR CUFF REPAIR  Right   . TUBAL LIGATION        Social History:     Social History   Tobacco Use  . Smoking status: Former Smoker    Packs/day: 1.00    Years: 30.00    Pack years: 30.00    Types: Cigarettes    Last attempt to quit: 05/21/1986    Years since quitting: 31.0  . Smokeless tobacco: Never Used  Substance Use Topics  . Alcohol use: No     Lives - at home  Mobility - walks by self   Family History :     Family History  Problem Relation Age of Onset  . Stroke Mother   . Cancer Sister        lung cancer       Home Medications:   Prior to Admission medications   Medication Sig Start Date End Date Taking? Authorizing Provider  atorvastatin (LIPITOR) 40 MG tablet Take 1 tablet (40 mg total) by mouth daily at 6 PM. 12/12/16  Yes Mariel Aloe, MD  HYDROcodone-acetaminophen (NORCO) 7.5-325 MG tablet Take 1 tablet by mouth every 4 (four) hours as needed for moderate pain. 05/11/17  Yes [name], MD  metFORMIN (GLUCOPHAGE) 1000 MG tablet Take 1,000 mg by mouth every evening.  01/12/17  Yes [provider]  ondansetron (ZOFRAN) 8 MG tablet Take 1 tablet (8 mg total) by  mouth 2 (two) times daily. Patient taking differently: Take 8 mg by mouth every 8 (eight) hours as needed for nausea or vomiting.  01/09/17  Yes Alla Feeling, NP  polyethylene glycol (MIRALAX / GLYCOLAX) packet Take 17 g by mouth daily as needed for moderate constipation.    Yes [provider]  docusate sodium (COLACE) 100 MG capsule Take 100 mg by mouth daily as needed for moderate constipation.     [provider]  mirtazapine (REMERON) 7.5 MG tablet Take 1 tablet (7.5 mg total) by mouth at bedtime. Patient not taking: Reported on 05/25/2017 05/04/17   [name], MD  mupirocin ointment Drue Stager) 2 %  01/16/17   [provider]  oxyCODONE (OXYCONTIN) 10 mg 12 hr tablet Take 1 tablet (10 mg total) by mouth every 12 (twelve) hours. Patient not taking: Reported on 05/25/2017 05/13/17   [name], MD  OxyCODONE ER [facility]) 9 MG C12A Take 9 mg by mouth every 12 (twelve) hours. Patient not taking: Reported on 05/25/2017 05/15/17   [name], MD  prochlorperazine (COMPAZINE) 5 MG tablet Take 1-2 tablets (5-10 mg total) by mouth every 6 (six) hours as needed for nausea or vomiting. Patient not taking: Reported on 03/23/2017 01/23/17   [name], MD     Allergies:     Allergies  Allergen Reactions  . Keflex [Cephalexin] Rash  . Aspirin     Blood in stool      Physical Exam:   Vitals  Blood pressure (!) 87/51, pulse 88, temperature (!) 102.1 F (38.9 C), temperature source Rectal, resp. rate 15, height 5' 6.5" (1.689 m), weight 68 kg (150 lb), SpO2 97 %.   1. General  lying in bed in NAD,    2. Normal affect and insight, Not Suicidal or Homicidal, Awake Alert, Oriented X 3. (pt is alert to person, place and time)  3. No F.N deficits, ALL C.Nerves Intact, Strength 5/5 all 4 extremities, Sensation intact all 4 extremities, Plantars down going.  4. Ears and Eyes appear Normal, Conjunctivae clear, PERRLA. Moist Oral Mucosa.  5. Supple Neck, No JVD, No cervical  lymphadenopathy appriciated, No Carotid Bruits.  6. Symmetrical Chest wall movement, Good air movement bilaterally, CTAB.  7. RRR, No Gallops, Rubs or Murmurs, No Parasternal Heave.  8. Positive Bowel Sounds, Abdomen Soft, No tenderness, No organomegaly appriciated,No rebound -guarding or rigidity.  9.  No Cyanosis, Normal Skin Turgor, No Skin Rash or Bruise.  10. Good muscle tone,  joints appear normal , no effusions, Normal ROM.  11. No Palpable Lymph Nodes in Neck or Axillae     Data Review:    CBC Recent Labs  Lab 05/20/17 1204 05/25/17 1714  WBC 6.5 6.0  HGB 8.9* 9.2*  HCT 28.5* 29.3*  PLT 187 138*  MCV 99.3 99.3  MCH 31.0 31.2  MCHC 31.2* 31.4  RDW 15.9 16.1*  LYMPHSABS 0.6* 0.2*  MONOABS 0.6 0.0*  EOSABS 0.1 0.0  BASOSABS 0.0 0.0   ------------------------------------------------------------------------------------------------------------------  Chemistries  Recent Labs  Lab 05/20/17 1204 05/25/17 1714  NA 138 136  K 3.8 2.9*  CL 104 108  CO2 23 17*  GLUCOSE 196* 205*  BUN 22 25*  CREATININE 1.52* 1.93*  CALCIUM 8.4 7.9*  AST 10 57*  ALT 4 15  ALKPHOS 97 153*  BILITOT 0.4 1.2   ------------------------------------------------------------------------------------------------------------------ estimated creatinine clearance is 21.8 mL/min (A) (by C-G formula based on SCr of 1.93 mg/dL (H)). ------------------------------------------------------------------------------------------------------------------ No results for input(s): TSH, T4TOTAL, T3FREE, THYROIDAB in the last 72 hours.  Invalid input(s): FREET3  Coagulation profile Recent Labs  Lab 05/25/17 1714  INR 1.57   ------------------------------------------------------------------------------------------------------------------- No results for input(s): DDIMER in the last 72  hours. -------------------------------------------------------------------------------------------------------------------  Cardiac Enzymes No results for input(s): CKMB, TROPONINI, MYOGLOBIN in the last 168 hours.  Invalid input(s): CK ------------------------------------------------------------------------------------------------------------------ No results found for: BNP   ---------------------------------------------------------------------------------------------------------------  Urinalysis    Component Value Date/Time   COLORURINE YELLOW 05/25/2017 1714   APPEARANCEUR HAZY (A) 05/25/2017 1714   LABSPEC 1.010 05/25/2017 1714   LABSPEC 1.030 06/02/2016 1612   PHURINE 5.0 05/25/2017 1714   GLUCOSEU NEGATIVE 05/25/2017 1714   GLUCOSEU Negative 06/02/2016 1612   HGBUR MODERATE (A) 05/25/2017 1714   BILIRUBINUR NEGATIVE 05/25/2017 1714   BILIRUBINUR Color Interference 06/02/2016 1612   KETONESUR NEGATIVE 05/25/2017 1714   PROTEINUR NEGATIVE 05/25/2017 1714   UROBILINOGEN Color Interference 06/02/2016 1612   NITRITE NEGATIVE 05/25/2017 1714   LEUKOCYTESUR SMALL (A) 05/25/2017 1714   LEUKOCYTESUR Color Interference 06/02/2016 1612    ----------------------------------------------------------------------------------------------------------------   Imaging Results:    Ct Head Wo Contrast  Result Date: 05/25/2017 CLINICAL DATA:  Difficult to arouse and slow to respond. Altered level of consciousness. EXAM: CT HEAD WITHOUT CONTRAST TECHNIQUE: Contiguous axial images were obtained from the base of the skull through the vertex without intravenous contrast. COMPARISON:  12/11/2016 and 09/24/2015 CT FINDINGS: Brain: Chronic stable superficial atrophy with small vessel ischemic disease. Chronic left frontal lobe area of encephalomalacia. No acute intracranial mass, edema or midline shift. No extra-axial fluid collections nor intra-axial masses. Vascular: No hyperdense vessel or  unexpected calcification. Skull: Negative for acute fracture or focal lesions. Sinuses/Orbits: No acute finding. Other: None IMPRESSION: 1. Chronic stable superficial atrophy and small vessel ischemic disease. 2. Chronic left high frontal lobe focus of encephalomalacia. 3. No acute intracranial abnormality. Electronically Signed   By: Ashley Royalty M.D.   On: 05/25/2017 18:30   Dg Chest Port 1 View  Result Date: 05/25/2017 CLINICAL DATA:  Fever, altered mental status, pancreatic adenocarcinoma post chemotherapy EXAM: PORTABLE CHEST  1 VIEW COMPARISON:  Portable exam 1723 hours compared to 10/20/2010 and correlated with interval CT chest of 02/28/2016 FINDINGS: Normal heart size and pulmonary vascularity. Atherosclerotic calcification aorta. Bibasilar atelectasis and question small pleural effusions. Central peribronchial thickening. Upper lungs clear. No pneumothorax. Bones demineralized. IMPRESSION: Bronchitic changes with bibasilar atelectasis and probable small pleural effusions. Electronically Signed   By: Lavonia Dana M.D.   On: 05/25/2017 17:48       Assessment & Plan:    Principal Problem:   Fever Active Problems:   Hypotension   UTI (urinary tract infection)      UTI, Sepsis (hypotension, elevation in lactate and  ARF) Awaiting urine culture vanco iv, levaquin iv pharmacy to dose  Fever Secondary to UTI  Hypotension Tele Trop I q6h x3 Check cortisol Check cardiac echo  ARF Check urine sodium, urine creatinine, urine eosinophils Check renal ultrasound Hydrate with ns iv  Abnormal liver function Check acute hepatitis panel  DM2 STOP METFORMIN due to ARF fsbs ac and qhs, ISS  Pancreatic cancer CODE STATUS DNR, no pressors per pt  Severe protein calorie malnutrition prostat   DVT Prophylaxis Heparin -  SCDs   AM Labs Ordered, also please review Full Orders  Family Communication: Admission, patients condition and plan of care including tests being ordered have  been discussed with the patient  who indicate understanding and agree with the plan and Code Status.  Code Status DNR  Likely DC to  home  Condition GUARDED   Consults called: none  Admission status: inpatient   Time spent in minutes : 45   Jani Gravel M.D on 05/25/2017 at 11:11 PM  Between 7am to 7pm - Pager - [phone]  . After 7pm go to [email or web address] - password [facility]  [facility] - Office  [phone]

## 2017-05-25 NOTE — ED Notes (Signed)
Bed: WA21 Expected date:  Expected time:  Means of arrival:  Comments: 84 yo AMS

## 2017-05-25 NOTE — ED Notes (Signed)
Lactic resulted: 7.85 RN and MD aware

## 2017-05-25 NOTE — ED Notes (Signed)
ED TO INPATIENT HANDOFF REPORT  Name/Age/Gender Nicole Sherman 84 y.o. female  Code Status    Code Status Orders  (From admission, onward)        Start     Ordered   05/25/17 2003  Do not attempt resuscitation/DNR  Continuous    Question Answer Comment  In the event of cardiac or respiratory ARREST Do not call a "code blue"   In the event of cardiac or respiratory ARREST Do not perform Intubation, CPR, defibrillation or ACLS   In the event of cardiac or respiratory ARREST Use medication by any route, position, wound care, and other measures to relive pain and suffering. May use oxygen, suction and manual treatment of airway obstruction as needed for comfort.      05/25/17 2002    Code Status History    Date Active Date Inactive Code Status Order ID Comments User Context   12/12/2016 02:57 12/12/2016 22:41 DNR [id number]  Etta Quill, DO ED    Advance Directive Documentation     Most Recent Value  Type of Advance Directive  Healthcare Power of Attorney, Living will  Pre-existing out of facility DNR order (yellow form or pink MOST form)  No data  "MOST" Form in Place?  No data      Home/SNF/Other Home  Chief Complaint Altered Mental Status  Level of Care/Admitting Diagnosis ED Disposition    ED Disposition Condition Comment   Admit  Hospital Area: [facility] [[id number]]  Level of Care: Stepdown [14]  Admit to SDU based on following criteria: Hemodynamic compromise or significant risk of instability:  Patient requiring short term acute titration and management of vasoactive drips, and invasive monitoring (i.e., CVP and Arterial line).  Diagnosis: Fever [681275]  Admitting Physician: Jani Gravel [[id number]]  Attending Physician: Jani Gravel [phone]  Estimated length of stay: past midnight tomorrow  Certification:: I certify this patient will need inpatient services for at least 2 midnights  PT Class (Do Not Modify): Inpatient [101]  PT Acc Code (Do Not  Modify): Private [1]       Medical History Past Medical History:  Diagnosis Date  . Arthritis    osteoarthritis-hands wrist  . Cancer ([place])    pancreatic mass- dx. adenocarcinoma" CT abdomen 05-16-15  . Diabetes mellitus without complication ([place])   . Diverticulosis    showing on CT of abdomen 05-16-15  . Hypertension   . Hypothyroidism     Allergies Allergies  Allergen Reactions  . Keflex [Cephalexin] Rash  . Aspirin     Blood in stool     IV Location/Drains/Wounds Patient Lines/Drains/Airways Status   Active Line/Drains/Airways    Name:   Placement date:   Placement time:   Site:   Days:   Peripheral IV 06/02/16 Right Forearm   06/02/16    1455    Forearm   357   Peripheral IV 05/25/17 Left Antecubital   05/25/17    1726    Antecubital   less than 1          Labs/Imaging Results for orders placed or performed during the hospital encounter of 05/25/17 (from the past 48 hour(s))  Comprehensive metabolic panel     Status: Abnormal   Collection Time: 05/25/17  5:14 PM  Result Value Ref Range   Sodium 136 135 - 145 mmol/L   Potassium 2.9 (L) 3.5 - 5.1 mmol/L   Chloride 108 101 - 111 mmol/L   CO2 17 (L) 22 - 32  mmol/L   Glucose, Bld 205 (H) 65 - 99 mg/dL   BUN 25 (H) 6 - 20 mg/dL   Creatinine, Ser 1.93 (H) 0.44 - 1.00 mg/dL   Calcium 7.9 (L) 8.9 - 10.3 mg/dL   Total Protein 5.7 (L) 6.5 - 8.1 g/dL   Albumin 2.4 (L) 3.5 - 5.0 g/dL   AST 57 (H) 15 - 41 U/L   ALT 15 14 - 54 U/L   Alkaline Phosphatase 153 (H) 38 - 126 U/L   Total Bilirubin 1.2 0.3 - 1.2 mg/dL   GFR calc non Af Amer 23 (L) >60 mL/min   GFR calc Af Amer 27 (L) >60 mL/min    Comment: (NOTE) The eGFR has been calculated using the CKD EPI equation. This calculation has not been validated in all clinical situations. eGFR's persistently <60 mL/min signify possible Chronic Kidney Disease.    Anion gap 11 5 - 15  CBC with Differential     Status: Abnormal   Collection Time: 05/25/17  5:14 PM  Result  Value Ref Range   WBC 6.0 4.0 - 10.5 K/uL   RBC 2.95 (L) 3.87 - 5.11 MIL/uL   Hemoglobin 9.2 (L) 12.0 - 15.0 g/dL   HCT 29.3 (L) 36.0 - 46.0 %   MCV 99.3 78.0 - 100.0 fL   MCH 31.2 26.0 - 34.0 pg   MCHC 31.4 30.0 - 36.0 g/dL   RDW 16.1 (H) 11.5 - 15.5 %   Platelets 138 (L) 150 - 400 K/uL   Neutrophils Relative % 95 %   Neutro Abs 5.7 1.7 - 7.7 K/uL   Lymphocytes Relative 3 %   Lymphs Abs 0.2 (L) 0.7 - 4.0 K/uL   Monocytes Relative 1 %   Monocytes Absolute 0.0 (L) 0.1 - 1.0 K/uL   Eosinophils Relative 1 %   Eosinophils Absolute 0.0 0.0 - 0.7 K/uL   Basophils Relative 0 %   Basophils Absolute 0.0 0.0 - 0.1 K/uL  Protime-INR     Status: Abnormal   Collection Time: 05/25/17  5:14 PM  Result Value Ref Range   Prothrombin Time 18.7 (H) 11.4 - 15.2 seconds   INR 1.57   Urinalysis, Routine w reflex microscopic     Status: Abnormal   Collection Time: 05/25/17  5:14 PM  Result Value Ref Range   Color, Urine YELLOW YELLOW   APPearance HAZY (A) CLEAR   Specific Gravity, Urine 1.010 1.005 - 1.030   pH 5.0 5.0 - 8.0   Glucose, UA NEGATIVE NEGATIVE mg/dL   Hgb urine dipstick MODERATE (A) NEGATIVE   Bilirubin Urine NEGATIVE NEGATIVE   Ketones, ur NEGATIVE NEGATIVE mg/dL   Protein, ur NEGATIVE NEGATIVE mg/dL   Nitrite NEGATIVE NEGATIVE   Leukocytes, UA SMALL (A) NEGATIVE   RBC / HPF 0-5 0 - 5 RBC/hpf   WBC, UA 6-30 0 - 5 WBC/hpf   Bacteria, UA MANY (A) NONE SEEN   Squamous Epithelial / LPF 0-5 (A) NONE SEEN  I-Stat CG4 Lactic Acid, ED     Status: Abnormal   Collection Time: 05/25/17  7:57 PM  Result Value Ref Range   Lactic Acid, Venous 5.83 (HH) 0.5 - 1.9 mmol/L   Comment NOTIFIED PHYSICIAN    Ct Head Wo Contrast  Result Date: 05/25/2017 CLINICAL DATA:  Difficult to arouse and slow to respond. Altered level of consciousness. EXAM: CT HEAD WITHOUT CONTRAST TECHNIQUE: Contiguous axial images were obtained from the base of the skull through the vertex without intravenous contrast.  COMPARISON:  12/11/2016 and 09/24/2015 CT FINDINGS: Brain: Chronic stable superficial atrophy with small vessel ischemic disease. Chronic left frontal lobe area of encephalomalacia. No acute intracranial mass, edema or midline shift. No extra-axial fluid collections nor intra-axial masses. Vascular: No hyperdense vessel or unexpected calcification. Skull: Negative for acute fracture or focal lesions. Sinuses/Orbits: No acute finding. Other: None IMPRESSION: 1. Chronic stable superficial atrophy and small vessel ischemic disease. 2. Chronic left high frontal lobe focus of encephalomalacia. 3. No acute intracranial abnormality. Electronically Signed   By: Ashley Royalty M.D.   On: 05/25/2017 18:30   Dg Chest Port 1 View  Result Date: 05/25/2017 CLINICAL DATA:  Fever, altered mental status, pancreatic adenocarcinoma post chemotherapy EXAM: PORTABLE CHEST 1 VIEW COMPARISON:  Portable exam 1723 hours compared to 10/20/2010 and correlated with interval CT chest of 02/28/2016 FINDINGS: Normal heart size and pulmonary vascularity. Atherosclerotic calcification aorta. Bibasilar atelectasis and question small pleural effusions. Central peribronchial thickening. Upper lungs clear. No pneumothorax. Bones demineralized. IMPRESSION: Bronchitic changes with bibasilar atelectasis and probable small pleural effusions. Electronically Signed   By: Lavonia Dana M.D.   On: 05/25/2017 17:48    Pending Labs Unresulted Labs (From admission, onward)   Start     Ordered   05/25/17 2126  Lactic acid, plasma  STAT,   R     05/25/17 2126   05/25/17 1648  Culture, blood (Routine x 2)  BLOOD CULTURE X 2,   STAT     05/25/17 1647   05/25/17 1648  Urine culture  STAT,   STAT     05/25/17 1647      Vitals/Pain Today's Vitals   05/25/17 2030 05/25/17 2100 05/25/17 2114 05/25/17 2200  BP: (!) 91/58 (!) 86/60 (!) 84/54 (!) 65/48  Pulse: 94 93 92 90  Resp: (!) 27 (!) 27 (!) 24   Temp:      TempSrc:      SpO2: 93% 93% 93% 94%   Weight:      Height:        Isolation Precautions No active isolations  Medications Medications  sodium chloride 0.9 % bolus 1,000 mL (1,000 mLs Intravenous New Bag/Given 05/25/17 2151)  sodium chloride 0.9 % bolus 1,000 mL (0 mLs Intravenous Stopped 05/25/17 1836)    And  sodium chloride 0.9 % bolus 1,000 mL (0 mLs Intravenous Stopped 05/25/17 1836)    And  sodium chloride 0.9 % bolus 250 mL (0 mLs Intravenous Stopped 05/25/17 1933)  levofloxacin (LEVAQUIN) IVPB 750 mg (0 mg Intravenous Stopped 05/25/17 1924)  aztreonam (AZACTAM) 2 g in dextrose 5 % 50 mL IVPB (0 g Intravenous Stopped 05/25/17 1836)  vancomycin (VANCOCIN) IVPB 1000 mg/200 mL premix (0 mg Intravenous Stopped 05/25/17 2042)  acetaminophen (TYLENOL) tablet 650 mg (650 mg Oral Given 05/25/17 1942)  morphine 4 MG/ML injection 4 mg (4 mg Intravenous Given 05/25/17 2149)    Mobility non-ambulatory

## 2017-05-25 NOTE — ED Notes (Signed)
RN only able to obtain one set of cultures before starting antibiotics. RN stuck patient x3 to attempt set of cultures.

## 2017-05-26 ENCOUNTER — Other Ambulatory Visit: Payer: Self-pay

## 2017-05-26 DIAGNOSIS — R509 Fever, unspecified: Secondary | ICD-10-CM | POA: Diagnosis not present

## 2017-05-26 DIAGNOSIS — N3000 Acute cystitis without hematuria: Secondary | ICD-10-CM

## 2017-05-26 DIAGNOSIS — A419 Sepsis, unspecified organism: Principal | ICD-10-CM

## 2017-05-26 DIAGNOSIS — N179 Acute kidney failure, unspecified: Secondary | ICD-10-CM

## 2017-05-26 LAB — BLOOD CULTURE ID PANEL (REFLEXED)
Acinetobacter baumannii: NOT DETECTED
CANDIDA TROPICALIS: NOT DETECTED
Candida albicans: NOT DETECTED
Candida glabrata: NOT DETECTED
Candida krusei: NOT DETECTED
Candida parapsilosis: NOT DETECTED
Carbapenem resistance: NOT DETECTED
ENTEROBACTER CLOACAE COMPLEX: NOT DETECTED
ENTEROCOCCUS SPECIES: NOT DETECTED
ESCHERICHIA COLI: DETECTED — AB
Enterobacteriaceae species: DETECTED — AB
HAEMOPHILUS INFLUENZAE: NOT DETECTED
Klebsiella oxytoca: NOT DETECTED
Klebsiella pneumoniae: NOT DETECTED
LISTERIA MONOCYTOGENES: NOT DETECTED
NEISSERIA MENINGITIDIS: NOT DETECTED
Proteus species: NOT DETECTED
Pseudomonas aeruginosa: NOT DETECTED
SERRATIA MARCESCENS: NOT DETECTED
STAPHYLOCOCCUS SPECIES: NOT DETECTED
STREPTOCOCCUS AGALACTIAE: NOT DETECTED
STREPTOCOCCUS PNEUMONIAE: NOT DETECTED
STREPTOCOCCUS SPECIES: NOT DETECTED
Staphylococcus aureus (BCID): NOT DETECTED
Streptococcus pyogenes: NOT DETECTED

## 2017-05-26 MED ORDER — ATORVASTATIN CALCIUM 40 MG PO TABS: 40.0000 mg | ORAL_TABLET | Freq: Every day | ORAL | Status: DC

## 2017-05-26 MED ORDER — MORPHINE SULFATE (CONCENTRATE) 10 MG/0.5ML PO SOLN
10.0000 mg | ORAL | Status: DC | PRN
  Administered 2017-05-26: 10 mg via ORAL
  Filled 2017-05-26: qty 0.5

## 2017-05-26 MED ORDER — MORPHINE SULFATE (CONCENTRATE) 10 MG /0.5 ML PO SOLN
10.0000 mg | ORAL | 0 refills | Status: AC | PRN
Start: 2017-05-26 — End: ?

## 2017-05-26 MED ORDER — HYDROCODONE-ACETAMINOPHEN 7.5-325 MG PO TABS: 1.0000 | ORAL_TABLET | ORAL | Status: DC | PRN

## 2017-05-26 MED ORDER — LIP MEDEX EX OINT
TOPICAL_OINTMENT | CUTANEOUS | Status: AC
  Administered 2017-05-26: 15:00:00
  Filled 2017-05-26: qty 7

## 2017-05-26 MED ORDER — LEVOFLOXACIN IN D5W 750 MG/150ML IV SOLN: 750.0000 mg | INTRAVENOUS | Status: DC

## 2017-05-26 MED ORDER — ONDANSETRON HCL 4 MG/2ML IJ SOLN
4.0000 mg | Freq: Four times a day (QID) | INTRAMUSCULAR | Status: DC | PRN
  Administered 2017-05-26: 4 mg via INTRAVENOUS
  Filled 2017-05-26: qty 2

## 2017-05-26 MED ORDER — INSULIN ASPART 100 UNIT/ML ~~LOC~~ SOLN: 0.0000 [IU] | Freq: Every day | SUBCUTANEOUS | Status: DC

## 2017-05-26 MED ORDER — SODIUM CHLORIDE 0.9 % IV SOLN: INTRAVENOUS | Status: DC

## 2017-05-26 MED ORDER — INSULIN ASPART 100 UNIT/ML ~~LOC~~ SOLN: 0.0000 [IU] | Freq: Three times a day (TID) | SUBCUTANEOUS | Status: DC

## 2017-05-26 MED ORDER — ENOXAPARIN SODIUM 30 MG/0.3ML ~~LOC~~ SOLN: 30.0000 mg | SUBCUTANEOUS | Status: DC

## 2017-05-26 MED ORDER — ACETAMINOPHEN 650 MG RE SUPP: 650.0000 mg | Freq: Four times a day (QID) | RECTAL | Status: DC | PRN

## 2017-05-26 MED ORDER — HYOSCYAMINE SULFATE 0.125 MG SL SUBL: 0.1250 mg | SUBLINGUAL_TABLET | Freq: Four times a day (QID) | SUBLINGUAL | 0 refills | Status: AC | PRN

## 2017-05-26 MED ORDER — ACETAMINOPHEN 325 MG PO TABS: 650.0000 mg | ORAL_TABLET | Freq: Four times a day (QID) | ORAL | Status: DC | PRN

## 2017-05-26 MED ORDER — DOCUSATE SODIUM 100 MG PO CAPS: 100.0000 mg | ORAL_CAPSULE | Freq: Every day | ORAL | Status: DC | PRN

## 2017-05-26 NOTE — Progress Notes (Signed)
LCSW following for disposition: [facility]  RN with [place] reports she has met with family and decision has been made that patient will transitioin into hospice this afternoon as bed is available.  Call placed to MD who is in agreement and will complete DC summary for today.  LCSW will complete EMS forms for transport. Patient will transition to comfort care/hospice home this evening.  Daughters and husband at bedside working with hospice on paperwork.  No other needs.  Lane Hacker, MSW Clinical Social Work: [employer] Coverage for :  [phone]

## 2017-05-26 NOTE — Progress Notes (Addendum)
When pt arrived to floor from ED, Marylyn Ishihara, RN contacted this NP to tell me pt wanted to be comfort care only. NP to bedside. The pt is alert and oriented and her husband and daughters are at the bedside. NP spoke with pt in length. We discussed what comfort care means (she will only be given meds that keep her comfortable-Morphine, Ativan, etc). We will stop antibiotics, labs, xrays, etc. She understands fully what this means. Orders changed to reflect this. Perhaps, she can go home with hospice tomorrow.  Morphine gtt started per pt wishes. NP spoke to pt's husband in hallway. He says she has already lived longer than her original prognosis and she is just tired. Husband wants to do what is best for his wife. He doesn't want her to suffer. He is in agreement with plan.   I discussed this with Dr. Maudie Mercury as he admitted her.  KJKG, NP [facility]

## 2017-05-26 NOTE — Discharge Summary (Signed)
Physician Discharge Summary  QUINCIE HAROON TJQ:[id number] DOB: 1933-08-17 DOA: 05/25/2017  PCP: Hulan Fess, MD  Admit date: 05/25/2017 Discharge date: 05/26/2017  Time spent: 45 minutes  Recommendations for Outpatient Follow-up:  1. Residential Hospice for End of life care   Discharge Diagnoses:  Principal Problem:   Fever   UTI sepsis   Locally advanced Pancreatic cancer   Hypotension   UTI (urinary tract infection)   Sepsis ([place])   Discharge Condition: poor  Diet recommendation: comfort feeds  Filed Weights   05/25/17 1646  Weight: 68 kg (150 lb)    History of present illness:  SueMiddletonis a83 y.o.female, with type 2 diabetes, hypertension, history of stroke, locally advanced pancreatic cancer with poor tolerance to chemotherapy chronic abdominal pain,  presented to the emergency room last evening with weakness and UTI.  Hospital Course:  Mariko Nowakowski  is a 84 y.o. female,  with type 2 diabetes, hypertension, history of stroke, locally advanced pancreatic cancer with poor tolerance to chemotherapy chronic abdominal pain,  presented to the emergency room last evening with weakness and UTI and sepsis. -Found to have UTI with sepsis, shortly after admission and patient arrived to the floor she was alert and oriented with her husband and daughters at bedside and decided to pursue comfort measures only hence antibiotics and other labs fluids etc. were discontinued and she was started on a morphine drip per patient's wishes for comfort focused care.  1.  UTI with sepsis  -Now comfort care on morphine drip -Continue morphine drip, palliative input requested , felt to be appropriate for residential hospice- -Social work consulted -transitioned to PO liquid morphine at discharge  2. Locally advanced pancreatic carcinoma   -She was unable to tolerate chemotherapy for long -Recently followed with supportive care only -Discussed with daughter at bedside again who  reiterates comfort care/hospice as been the goal  3. Acute renal failure  -Now comfort care, [place] labs  4. Type 2 diabetes mellitus  5. Severe protein calorie malnutrition 6. Adult failure to thrive  Code Status:  DO NOT RESUSCITATE    Examination:  General exam:  chronically ill-appearing, somnolent female, arouses to verbal stimuli  Respiratory system: diminished breath sounds at bases  Cardiovascular system: S1 & S2 heard, RRR. Gastrointestinal system: Abdomen is nondistended, soft  Central nervous system: somnolent secondary to morphine  Extremities: 1+ edema  Skin: No rashes, lesions or ulcers Psychiatry: Unable to assess      Domenic Polite, MD  [facility]  05/26/2017, 12:54 PM         Consultations:  Palliative team/social worker  Discharge Exam: Vitals:   05/26/17 0000 05/26/17 0100  BP:    Pulse: 96 94  Resp: (!) 24 (!) 23  Temp:    SpO2: (!) 89% (!) 88%    General: somnolent, no distress Cardiovascular: S1S2/RRR Respiratory: decreased BS at bases  Discharge Instructions   Discharge Instructions    Activity as tolerated - No restrictions   Complete by:  As directed    Discharge instructions   Complete by:  As directed    Comfort feeds     Allergies as of 05/26/2017      Reactions   Keflex [cephalexin] Rash   Aspirin    Blood in stool       Medication List    STOP taking these medications   atorvastatin 40 MG tablet Commonly known as:  LIPITOR   HYDROcodone-acetaminophen 7.5-325 MG tablet Commonly known as:  NORCO  metFORMIN 1000 MG tablet Commonly known as:  GLUCOPHAGE   mirtazapine 7.5 MG tablet Commonly known as:  REMERON   ondansetron 8 MG tablet Commonly known as:  ZOFRAN   oxyCODONE 10 mg 12 hr tablet Commonly known as:  OXYCONTIN   OxyCODONE ER 9 MG C12a Commonly known as:  XTAMPZA ER   prochlorperazine 5 MG tablet Commonly known as:  COMPAZINE     TAKE these medications   docusate sodium 100 MG  capsule Commonly known as:  COLACE Take 100 mg by mouth daily as needed for moderate constipation.   hyoscyamine 0.125 MG SL tablet Commonly known as:  LEVSIN SL Place 1 tablet (0.125 mg total) under the tongue every 6 (six) hours as needed (terminal secretions).   morphine CONCENTRATE 10 mg / 0.5 ml concentrated solution Take 0.5 mLs (10 mg total) by mouth every 2 (two) hours as needed for severe pain.   mupirocin ointment 2 % Commonly known as:  BACTROBAN   polyethylene glycol packet Commonly known as:  MIRALAX / GLYCOLAX Take 17 g by mouth daily as needed for moderate constipation.      Allergies  Allergen Reactions  . Keflex [Cephalexin] Rash  . Aspirin     Blood in stool       The results of significant diagnostics from this hospitalization (including imaging, microbiology, ancillary and laboratory) are listed below for reference.    Significant Diagnostic Studies: Ct Abdomen Pelvis Wo Contrast  Result Date: 05/20/2017 CLINICAL DATA:  Pancreatic cancer diagnosed January 2017. Chemotherapy radiation therapy EXAM: CT ABDOMEN AND PELVIS WITHOUT CONTRAST TECHNIQUE: Multidetector CT imaging of the abdomen and pelvis was performed following the standard protocol without IV contrast. COMPARISON:  CT 07/04/2016 FINDINGS: Lower chest: Lung bases are clear. Hepatobiliary: No focal hepatic lesions noncontrast exam. Gallbladder grossly. Moderate volume ascites along RIGHT hepatic margin. Equal volume along the spleen spleen Pancreas: Hypodense lesion the mid pancreas measures 19 mm (image 24 series 2) compared to 18 mm on prior for no change. Spleen: Normal spleen Adrenals/urinary tract: adrenal glands normal. New hydronephrosis the LEFT. New hydroureter on the LEFT. Hydroureter extends into the pelvis. No clear obstructing lesions identified. RIGHT kidney and ureter normal.  Bladder normal Stomach/Bowel: Stomach, small-bowel cecum normal. The colon and rectosigmoid colon normal. Fluid  mesenteric leaves diverticula of the sigmoid Vascular/Lymphatic: Abdominal aorta is normal caliber with atherosclerotic calcification. There is no retroperitoneal or periportal lymphadenopathy. No pelvic lymphadenopathy. Reproductive: Uterus normal Other: There is a mass at the level of the superior mesenteric artery measuring 2.4 cm. This appears increased from comparison exam (image 24, series 2) There is thickened along the ventral abdominal wall adjacent to the lesser omentum midline (image 94, series 2). Additionally there is new nodularity in the greater omentum (image 63 and 69 series 2). Nodules measure up to 10 mm Musculoskeletal: No aggressive osseous lesion IMPRESSION: 1. Interval increase in masslike thickening at the level of the superior mesenteric artery origin is concerning for local pancreatic cancer progression. Non IV contrast imaging makes evaluation difficult. 2. Mass lesions in the pancreas is stable. 3. Interval increase in intraperitoneal free fluid also concerning for disease progression. 4. New LEFT hydroureter without obstructing lesion identified. Favor occult peritoneal metastasis in the LEFT pelvis which is causing the LEFT ureteral obstruction. 5. New nodularity lesser and greater omentum consistent with metastatic pancreatic carcinoma progression. These results will be called to the ordering clinician or representative by the Radiologist Assistant, and communication documented in the PACS or  zVision Dashboard. Electronically Signed   By: Suzy Bouchard M.D.   On: 05/20/2017 16:31   Ct Head Wo Contrast  Result Date: 05/25/2017 CLINICAL DATA:  Difficult to arouse and slow to respond. Altered level of consciousness. EXAM: CT HEAD WITHOUT CONTRAST TECHNIQUE: Contiguous axial images were obtained from the base of the skull through the vertex without intravenous contrast. COMPARISON:  12/11/2016 and 09/24/2015 CT FINDINGS: Brain: Chronic stable superficial atrophy with small vessel  ischemic disease. Chronic left frontal lobe area of encephalomalacia. No acute intracranial mass, edema or midline shift. No extra-axial fluid collections nor intra-axial masses. Vascular: No hyperdense vessel or unexpected calcification. Skull: Negative for acute fracture or focal lesions. Sinuses/Orbits: No acute finding. Other: None IMPRESSION: 1. Chronic stable superficial atrophy and small vessel ischemic disease. 2. Chronic left high frontal lobe focus of encephalomalacia. 3. No acute intracranial abnormality. Electronically Signed   By: Ashley Royalty M.D.   On: 05/25/2017 18:30   Dg Chest Port 1 View  Result Date: 05/25/2017 CLINICAL DATA:  Fever, altered mental status, pancreatic adenocarcinoma post chemotherapy EXAM: PORTABLE CHEST 1 VIEW COMPARISON:  Portable exam 1723 hours compared to 10/20/2010 and correlated with interval CT chest of 02/28/2016 FINDINGS: Normal heart size and pulmonary vascularity. Atherosclerotic calcification aorta. Bibasilar atelectasis and question small pleural effusions. Central peribronchial thickening. Upper lungs clear. No pneumothorax. Bones demineralized. IMPRESSION: Bronchitic changes with bibasilar atelectasis and probable small pleural effusions. Electronically Signed   By: Lavonia Dana M.D.   On: 05/25/2017 17:48    Microbiology: Recent Results (from the past 240 hour(s))  Culture, blood (Routine x 2)     Status: None (Preliminary result)   Collection Time: 05/25/17  5:13 PM  Result Value Ref Range Status   Specimen Description BLOOD LEFT ANTECUBITAL  Final   Special Requests   Final    BOTTLES DRAWN AEROBIC AND ANAEROBIC Blood Culture adequate volume   Culture  Setup Time   Final    GRAM NEGATIVE RODS AEROBIC BOTTLE ONLY Organism ID to follow CRITICAL RESULT CALLED TO, READ BACK BY AND VERIFIED WITH: M. Chanetta Marshall.D. 10:45 05/26/17 ([email or web address])    Culture PENDING  Incomplete   Report Status PENDING  Incomplete  Blood Culture ID Panel (Reflexed)      Status: Abnormal   Collection Time: 05/25/17  5:13 PM  Result Value Ref Range Status   Enterococcus species NOT DETECTED NOT DETECTED Final   Listeria monocytogenes NOT DETECTED NOT DETECTED Final   Staphylococcus species NOT DETECTED NOT DETECTED Final   Staphylococcus aureus NOT DETECTED NOT DETECTED Final   Streptococcus species NOT DETECTED NOT DETECTED Final   Streptococcus agalactiae NOT DETECTED NOT DETECTED Final   Streptococcus pneumoniae NOT DETECTED NOT DETECTED Final   Streptococcus pyogenes NOT DETECTED NOT DETECTED Final   Acinetobacter baumannii NOT DETECTED NOT DETECTED Final   Enterobacteriaceae species DETECTED (A) NOT DETECTED Final    Comment: Enterobacteriaceae represent a large family of gram-negative bacteria, not a single organism. CRITICAL RESULT CALLED TO, READ BACK BY AND VERIFIED WITH: M. Lear Ng Pharm.D. 10:45 05/26/17 ([email or web address])    Enterobacter cloacae complex NOT DETECTED NOT DETECTED Final   Escherichia coli DETECTED (A) NOT DETECTED Final    Comment: CRITICAL RESULT CALLED TO, READ BACK BY AND VERIFIED WITH: M. Lear Ng Pharm.D. 10:45 05/26/17 ([email or web address])    Klebsiella oxytoca NOT DETECTED NOT DETECTED Final   Klebsiella pneumoniae NOT DETECTED NOT DETECTED Final   Proteus species NOT DETECTED NOT DETECTED Final   Serratia marcescens NOT  DETECTED NOT DETECTED Final   Carbapenem resistance NOT DETECTED NOT DETECTED Final   Haemophilus influenzae NOT DETECTED NOT DETECTED Final   Neisseria meningitidis NOT DETECTED NOT DETECTED Final   Pseudomonas aeruginosa NOT DETECTED NOT DETECTED Final   Candida albicans NOT DETECTED NOT DETECTED Final   Candida glabrata NOT DETECTED NOT DETECTED Final   Candida krusei NOT DETECTED NOT DETECTED Final   Candida parapsilosis NOT DETECTED NOT DETECTED Final   Candida tropicalis NOT DETECTED NOT DETECTED Final     Labs: Basic Metabolic Panel: Recent Labs  Lab 05/20/17 1204 05/25/17 1714  NA 138 136  K 3.8 2.9*  CL 104  108  CO2 23 17*  GLUCOSE 196* 205*  BUN 22 25*  CREATININE 1.52* 1.93*  CALCIUM 8.4 7.9*   Liver Function Tests: Recent Labs  Lab 05/20/17 1204 05/25/17 1714  AST 10 57*  ALT 4 15  ALKPHOS 97 153*  BILITOT 0.4 1.2  PROT 6.8 5.7*  ALBUMIN 2.7* 2.4*   No results for input(s): LIPASE, AMYLASE in the last 168 hours. No results for input(s): AMMONIA in the last 168 hours. CBC: Recent Labs  Lab 05/20/17 1204 05/25/17 1714  WBC 6.5 6.0  NEUTROABS 5.2 5.7  HGB 8.9* 9.2*  HCT 28.5* 29.3*  MCV 99.3 99.3  PLT 187 138*   Cardiac Enzymes: No results for input(s): CKTOTAL, CKMB, CKMBINDEX, TROPONINI in the last 168 hours. BNP: BNP (last 3 results) No results for input(s): BNP in the last 8760 hours.  ProBNP (last 3 results) No results for input(s): PROBNP in the last 8760 hours.  CBG: No results for input(s): GLUCAP in the last 168 hours.     Signed:  Domenic Polite MD.  [facility] 05/26/2017, 2:38 PM

## 2017-05-26 NOTE — Progress Notes (Signed)
PROGRESS NOTE    Nicole Sherman  KXF:[id number] DOB: 03-03-1934 DOA: 05/25/2017 PCP: Hulan Fess, MD  Brief Narrative:Nicole Sherman  is a 84 y.o. female,  with type 2 diabetes, hypertension, history of stroke, locally advanced pancreatic cancer with poor tolerance to chemotherapy chronic abdominal pain,  presented to the emergency room last evening with weakness and UTI. -Found to have UTI with sepsis, shortly after admission and patient arrived to the floor she was alert and oriented with her husband and daughters at bedside and decided to pursue comfort measures only hence antibiotics and other labs fluids etc. were discontinued and she was started on a morphine drip per patient's wishes for comfort focused care.  Assessment & Plan:   UTI with sepsis  -Now comfort care on morphine drip -Continue morphine drip, palliative input requested to determine if she would be appropriate for residential hospice- -Social work consulted  Locally advanced pancreatic carcinoma   -She was unable to tolerate chemotherapy for long -Recently followed with supportive care only -Discussed with daughter at bedside again who reiterates comfort care/hospice as been the goal  Acute renal failure  -Now comfort care, DC'd labs  Type 2 diabetes mellitus   Severe protein calorie malnutrition  Adult failure to thrive  DVT prophylaxis: none  Code Status:  DO NOT RESUSCITATE  Family Communication: husband and daughter at bedside  Disposition Plan:  residential hospice versus home with hospice  Consultants:   none   Procedures:   Antimicrobials:    Subjective: -Somnolent but arouses, is able to drink a few sips of water, resting comfortably   Objective: Vitals:   05/25/17 2238 05/25/17 2300 05/26/17 0000 05/26/17 0100  BP: (!) 87/51 (!) 88/39    Pulse: 88 (!) 103 96 94  Resp: 15 (!) 26 (!) 24 (!) 23  Temp:  97.7 F (36.5 C)    TempSrc:  Oral    SpO2: 97% 91% (!) 89% (!) 88%  Weight:        Height:        Intake/Output Summary (Last 24 hours) at 05/26/2017 1254 Last data filed at 05/26/2017 0600 Gross per 24 hour  Intake 3660.75 ml  Output -  Net 3660.75 ml   Filed Weights   05/25/17 1646  Weight: 68 kg (150 lb)    Examination:  General exam:  chronically ill-appearing, somnolent female, arouses to verbal stimuli  Respiratory system: diminished breath sounds at bases  Cardiovascular system: S1 & S2 heard, RRR. Gastrointestinal system: Abdomen is nondistended, soft  Central nervous system: somnolent secondary to morphine  Extremities: 1+ edema  Skin: No rashes, lesions or ulcers Psychiatry: Unable to assess    Data Reviewed:   CBC: Recent Labs  Lab 05/20/17 1204 05/25/17 1714  WBC 6.5 6.0  NEUTROABS 5.2 5.7  HGB 8.9* 9.2*  HCT 28.5* 29.3*  MCV 99.3 99.3  PLT 187 696*   Basic Metabolic Panel: Recent Labs  Lab 05/20/17 1204 05/25/17 1714  NA 138 136  K 3.8 2.9*  CL 104 108  CO2 23 17*  GLUCOSE 196* 205*  BUN 22 25*  CREATININE 1.52* 1.93*  CALCIUM 8.4 7.9*   GFR: Estimated Creatinine Clearance: 21.8 mL/min (A) (by C-G formula based on SCr of 1.93 mg/dL (H)). Liver Function Tests: Recent Labs  Lab 05/20/17 1204 05/25/17 1714  AST 10 57*  ALT 4 15  ALKPHOS 97 153*  BILITOT 0.4 1.2  PROT 6.8 5.7*  ALBUMIN 2.7* 2.4*   No results for input(s):  LIPASE, AMYLASE in the last 168 hours. No results for input(s): AMMONIA in the last 168 hours. Coagulation Profile: Recent Labs  Lab 05/25/17 1714  INR 1.57   Cardiac Enzymes: No results for input(s): CKTOTAL, CKMB, CKMBINDEX, TROPONINI in the last 168 hours. BNP (last 3 results) No results for input(s): PROBNP in the last 8760 hours. HbA1C: No results for input(s): HGBA1C in the last 72 hours. CBG: No results for input(s): GLUCAP in the last 168 hours. Lipid Profile: No results for input(s): CHOL, HDL, LDLCALC, TRIG, CHOLHDL, LDLDIRECT in the last 72 hours. Thyroid Function  Tests: No results for input(s): TSH, T4TOTAL, FREET4, T3FREE, THYROIDAB in the last 72 hours. Anemia Panel: No results for input(s): VITAMINB12, FOLATE, FERRITIN, TIBC, IRON, RETICCTPCT in the last 72 hours. Urine analysis:    Component Value Date/Time   COLORURINE YELLOW 05/25/2017 1714   APPEARANCEUR HAZY (A) 05/25/2017 1714   LABSPEC 1.010 05/25/2017 1714   LABSPEC 1.030 06/02/2016 1612   PHURINE 5.0 05/25/2017 1714   GLUCOSEU NEGATIVE 05/25/2017 1714   GLUCOSEU Negative 06/02/2016 1612   HGBUR MODERATE (A) 05/25/2017 1714   BILIRUBINUR NEGATIVE 05/25/2017 1714   BILIRUBINUR Color Interference 06/02/2016 1612   KETONESUR NEGATIVE 05/25/2017 1714   PROTEINUR NEGATIVE 05/25/2017 1714   UROBILINOGEN Color Interference 06/02/2016 1612   NITRITE NEGATIVE 05/25/2017 1714   LEUKOCYTESUR SMALL (A) 05/25/2017 1714   LEUKOCYTESUR Color Interference 06/02/2016 1612   Sepsis Labs: @LABRCNTIP (procalcitonin:4,lacticidven:4)  ) Recent Results (from the past 240 hour(s))  Culture, blood (Routine x 2)     Status: None (Preliminary result)   Collection Time: 05/25/17  5:13 PM  Result Value Ref Range Status   Specimen Description BLOOD LEFT ANTECUBITAL  Final   Special Requests   Final    BOTTLES DRAWN AEROBIC AND ANAEROBIC Blood Culture adequate volume   Culture  Setup Time   Final    GRAM NEGATIVE RODS AEROBIC BOTTLE ONLY Organism ID to follow CRITICAL RESULT CALLED TO, READ BACK BY AND VERIFIED WITH: M. Lear Ng Pharm.D. 10:45 05/26/17 ([email or web address])    Culture PENDING  Incomplete   Report Status PENDING  Incomplete  Blood Culture ID Panel (Reflexed)     Status: Abnormal   Collection Time: 05/25/17  5:13 PM  Result Value Ref Range Status   Enterococcus species NOT DETECTED NOT DETECTED Final   Listeria monocytogenes NOT DETECTED NOT DETECTED Final   Staphylococcus species NOT DETECTED NOT DETECTED Final   Staphylococcus aureus NOT DETECTED NOT DETECTED Final   Streptococcus species NOT  DETECTED NOT DETECTED Final   Streptococcus agalactiae NOT DETECTED NOT DETECTED Final   Streptococcus pneumoniae NOT DETECTED NOT DETECTED Final   Streptococcus pyogenes NOT DETECTED NOT DETECTED Final   Acinetobacter baumannii NOT DETECTED NOT DETECTED Final   Enterobacteriaceae species DETECTED (A) NOT DETECTED Final    Comment: Enterobacteriaceae represent a large family of gram-negative bacteria, not a single organism. CRITICAL RESULT CALLED TO, READ BACK BY AND VERIFIED WITH: M. Lear Ng Pharm.D. 10:45 05/26/17 ([email or web address])    Enterobacter cloacae complex NOT DETECTED NOT DETECTED Final   Escherichia coli DETECTED (A) NOT DETECTED Final    Comment: CRITICAL RESULT CALLED TO, READ BACK BY AND VERIFIED WITH: M. Lear Ng Pharm.D. 10:45 05/26/17 ([email or web address])    Klebsiella oxytoca NOT DETECTED NOT DETECTED Final   Klebsiella pneumoniae NOT DETECTED NOT DETECTED Final   Proteus species NOT DETECTED NOT DETECTED Final   Serratia marcescens NOT DETECTED NOT DETECTED Final   Carbapenem resistance NOT DETECTED NOT DETECTED Final  Haemophilus influenzae NOT DETECTED NOT DETECTED Final   Neisseria meningitidis NOT DETECTED NOT DETECTED Final   Pseudomonas aeruginosa NOT DETECTED NOT DETECTED Final   Candida albicans NOT DETECTED NOT DETECTED Final   Candida glabrata NOT DETECTED NOT DETECTED Final   Candida krusei NOT DETECTED NOT DETECTED Final   Candida parapsilosis NOT DETECTED NOT DETECTED Final   Candida tropicalis NOT DETECTED NOT DETECTED Final         Radiology Studies: Ct Head Wo Contrast  Result Date: 05/25/2017 CLINICAL DATA:  Difficult to arouse and slow to respond. Altered level of consciousness. EXAM: CT HEAD WITHOUT CONTRAST TECHNIQUE: Contiguous axial images were obtained from the base of the skull through the vertex without intravenous contrast. COMPARISON:  12/11/2016 and 09/24/2015 CT FINDINGS: Brain: Chronic stable superficial atrophy with small vessel ischemic disease. Chronic  left frontal lobe area of encephalomalacia. No acute intracranial mass, edema or midline shift. No extra-axial fluid collections nor intra-axial masses. Vascular: No hyperdense vessel or unexpected calcification. Skull: Negative for acute fracture or focal lesions. Sinuses/Orbits: No acute finding. Other: None IMPRESSION: 1. Chronic stable superficial atrophy and small vessel ischemic disease. 2. Chronic left high frontal lobe focus of encephalomalacia. 3. No acute intracranial abnormality. Electronically Signed   By: Ashley Royalty M.D.   On: 05/25/2017 18:30   Dg Chest Port 1 View  Result Date: 05/25/2017 CLINICAL DATA:  Fever, altered mental status, pancreatic adenocarcinoma post chemotherapy EXAM: PORTABLE CHEST 1 VIEW COMPARISON:  Portable exam 1723 hours compared to 10/20/2010 and correlated with interval CT chest of 02/28/2016 FINDINGS: Normal heart size and pulmonary vascularity. Atherosclerotic calcification aorta. Bibasilar atelectasis and question small pleural effusions. Central peribronchial thickening. Upper lungs clear. No pneumothorax. Bones demineralized. IMPRESSION: Bronchitic changes with bibasilar atelectasis and probable small pleural effusions. Electronically Signed   By: Lavonia Dana M.D.   On: 05/25/2017 17:48        Scheduled Meds: . lip balm       Continuous Infusions: . sodium chloride    . morphine 2 mg/hr (05/26/17 0055)     LOS: 1 day    Time spent: 49min    Domenic Polite, MD [facility] Page via [email or web address], password [email or web address] After 7PM please contact night-coverage  05/26/2017, 12:54 PM

## 2017-05-26 NOTE — Progress Notes (Signed)
Palliative Medicine RN Note: Consult order noted. Due to high referral volume, there will likely be a delay seeing this patient.   Reviewed patient with Dr Broadus John and with Dr Rowe Pavy from PMT. Pt has urosepsis that is no longer being treated with abx, so PMT recommends inpt hospice due to symptoms requiring continuous medication infusion and prognosis less than two weeks.   Per Dr Broadus John, SW order placed. PMT will not see the patient at this time, as goals are clear. If new needs arise, please re-consult [place]. Thank you for inviting [place] to see this patient.  Marjie Skiff Tracen Mahler, RN, BSN, [facility] Palliative Medicine Team 05/26/2017 9:51 AM Office [phone]

## 2017-05-26 NOTE — Progress Notes (Signed)
PHARMACY - PHYSICIAN COMMUNICATION CRITICAL VALUE ALERT - BLOOD CULTURE IDENTIFICATION (BCID)  Nicole Sherman is an 84 y.o. female who presented to [facility] on 05/25/2017 with a chief complaint of urosepsis.  Name of physician (or Provider) Contacted: Broadus John  Current antibiotics: none  Changes to prescribed antibiotics recommended:  Recommendations declined by provider due to patient on comfort care only.  No treatment of infection is planned.    Results for orders placed or performed during the hospital encounter of 05/25/17  Blood Culture ID Panel (Reflexed) (Collected: 05/25/2017  5:13 PM)  Result Value Ref Range   Enterococcus species NOT DETECTED NOT DETECTED   Listeria monocytogenes NOT DETECTED NOT DETECTED   Staphylococcus species NOT DETECTED NOT DETECTED   Staphylococcus aureus NOT DETECTED NOT DETECTED   Streptococcus species NOT DETECTED NOT DETECTED   Streptococcus agalactiae NOT DETECTED NOT DETECTED   Streptococcus pneumoniae NOT DETECTED NOT DETECTED   Streptococcus pyogenes NOT DETECTED NOT DETECTED   Acinetobacter baumannii NOT DETECTED NOT DETECTED   Enterobacteriaceae species DETECTED (A) NOT DETECTED   Enterobacter cloacae complex NOT DETECTED NOT DETECTED   Escherichia coli DETECTED (A) NOT DETECTED   Klebsiella oxytoca NOT DETECTED NOT DETECTED   Klebsiella pneumoniae NOT DETECTED NOT DETECTED   Proteus species NOT DETECTED NOT DETECTED   Serratia marcescens NOT DETECTED NOT DETECTED   Carbapenem resistance NOT DETECTED NOT DETECTED   Haemophilus influenzae NOT DETECTED NOT DETECTED   Neisseria meningitidis NOT DETECTED NOT DETECTED   Pseudomonas aeruginosa NOT DETECTED NOT DETECTED   Candida albicans NOT DETECTED NOT DETECTED   Candida glabrata NOT DETECTED NOT DETECTED   Candida krusei NOT DETECTED NOT DETECTED   Candida parapsilosis NOT DETECTED NOT DETECTED   Candida tropicalis NOT DETECTED NOT DETECTED    Leeroy Bock 05/26/2017   10:57 AM

## 2017-05-26 NOTE — Clinical Social Work Note (Signed)
Clinical Social Work Assessment  Patient Details  Name: Nicole Sherman MRN: [id number] Date of Birth: 1934-03-04  Date of referral:  05/26/17               Reason for consult:  Discharge Planning, Emotional/Coping/Adjustment to Illness, End of Life/Hospice                Permission sought to share information with:  Case Manager, Family Supports Permission granted to share information::  Yes, Verbal Permission Granted  Name::        Agency::  [facility]  Relationship::  [name] and Jackelyn Poling (daughters)  husband Jeneen Rinks  (all at bedside during assessment)  Contact Information:     Housing/Transportation Living arrangements for the past 2 months:  Single Family Home Source of Information:  Medical Team, Palliative Care Team, Case Manager, Adult Children, Spouse Patient Interpreter Needed:  None Criminal Activity/Legal Involvement Pertinent to Current Situation/Hospitalization:  No - Comment as needed Significant Relationships:  Other Family Members, Adult Children, Spouse Lives with:  Spouse Do you feel safe going back to the place where you live?  No Need for family participation in patient care:  Yes (Comment)  Care giving concerns:  Patient admits to hospital for with type 2 diabetes, hypertension, history of stroke, locally advanced pancreatic cancer with poor tolerance to chemotherapy chronic abdominal pain,  presented to the emergency room last evening with weakness and UTI. -Found to have UTI with sepsis, shortly after admission and patient arrived to the floor she was alert and oriented with her husband and daughters at bedside and decided to pursue comfort measures only hence antibiotics and other labs fluids etc. were discontinued and she was started on a morphine drip per patient's wishes for comfort focused care.  Daughters feel residential hospice would be the best approach for patient as husband is unable to manage her needs at home. Husband reports he has been caring  for her and wants to continue to care for her at home if possible, but recognizes his inability to lift her, turn her, and change her if they do go home. Husband voices conflicting thoughts with making decision and daughters are supportive in helping with discharge plans.    Social Worker assessment / plan:  LCSW met with 2 daughters and husband at bedside.  Consulted CM to also join as husband is really wanting to take her home and needed better understanding of what care at home with hospice would look like. LCSW explained residential hospice process and offered choice. Family is very interested in [facility] and daughters have given permission to speak with liaison and make referral in effort to understand both options and outcomes.  [facility] was contacted, referral information given and liaison will come by this afternoon to speak with family and help with process.  Plan: Anticipate family will choose residential hospice due to care giving concerns and lack of support at home with only husband. Referral completed, will await family decision and meeting with liaison.  Will assist family with whatever decision they decide. CM aware of plans and also following.  Employment status:  Retired Nurse, adult PT Recommendations:  Not assessed at this time Information / Referral to community resources:  Other (Comment Required)([facility] referral/home with hospice)  Patient/Family's Response to care:  Undecided at this time, but feel residential will probably be best outcome.  Patient/Family's Understanding of and Emotional Response to Diagnosis, Current Treatment, and Prognosis:  Husband voices concern with plans to take her home and feels she would want to be home. He voices they have been married since he was 84 years old and she was 43 (over 25 years).  Daughters are involved to help husband with decision and assist with EOL care. Husband  is aware of comfort measures, but lacks understanding of all that would entail care at home with hospice.  Referrals in place to help husband and family make best decision for patient.   Emotional Assessment Appearance:  Appears stated age Attitude/Demeanor/Rapport:    Affect (typically observed):  Calm, Quiet Orientation:  Oriented to Self Alcohol / Substance use:  Not Applicable Psych involvement (Current and /or in the community):  No (Comment)  Discharge Needs  Concerns to be addressed:  Grief and Loss Concerns(addressing discharge plan) Readmission within the last 30 days:  No Current discharge risk:  None Barriers to Discharge:  No Barriers Identified   Lilly Cove, LCSW 05/26/2017, 1:33 PM

## 2017-05-26 NOTE — Progress Notes (Signed)
Attempted to call report to [facility]. Left name and number for RN to call back for report.

## 2017-05-26 NOTE — Consult Note (Signed)
[facility]: Met with pt;s spouse and two daughters in the room. Discussed hospice care and the philosopshy. Discussed pt's goals and they are in agreement with comfort care. Discussed with my MD and the pt was accepted and offered a bed at [facility]. They a have accepted it. Webb Silversmith RN   [phone]

## 2017-05-26 NOTE — Progress Notes (Signed)
Report given to Jeani Hawking, RN at residential hospice facility.

## 2017-05-27 ENCOUNTER — Ambulatory Visit: Payer: Medicare Other | Admitting: Hematology

## 2017-05-27 ENCOUNTER — Telehealth: Payer: Self-pay | Admitting: Hematology

## 2017-05-27 LAB — URINE CULTURE

## 2017-05-27 NOTE — Telephone Encounter (Signed)
Pt was recently hospitalized for UTI and sepsis, patient and her family decided to pursue comfort care only.  She was discharged to inpatient hospice facility in [facility].  I spoke with her daughter Nicole Sherman today, she has been receiving great care in the hospice facility.  I supported their decision, and she knows to call me if anything I can help. She appreciated the call.   [name]  05/27/2017

## 2017-05-30 LAB — CULTURE, BLOOD (ROUTINE X 2): SPECIAL REQUESTS: ADEQUATE

## 2017-06-05 ENCOUNTER — Telehealth: Payer: Self-pay | Admitting: *Deleted

## 2017-06-05 NOTE — Telephone Encounter (Signed)
Received call from daughter, Jackelyn Poling informing us that pt died.  Condolences offered. She spent 8 days in hospice.  Informed Dr Burr Medico.  Message to HIM.

## 2017-06-05 DEATH — deceased

## 2017-06-29 ENCOUNTER — Other Ambulatory Visit: Payer: Medicare Other

## 2017-06-29 ENCOUNTER — Ambulatory Visit: Payer: Medicare Other | Admitting: Hematology

## 2017-07-16 ENCOUNTER — Other Ambulatory Visit: Payer: Self-pay | Admitting: Nurse Practitioner
